# Patient Record
Sex: Female | Born: 1935
Health system: Southern US, Community
[De-identification: ages and names within clinical notes are randomized; demographics above are authoritative.]

## PROBLEM LIST (undated history)

## (undated) DIAGNOSIS — E559 Vitamin D deficiency, unspecified: Secondary | ICD-10-CM

## (undated) DIAGNOSIS — T50995A Adverse effect of other drugs, medicaments and biological substances, initial encounter: Secondary | ICD-10-CM

## (undated) DIAGNOSIS — Z860100 Personal history of colon polyps, unspecified: Secondary | ICD-10-CM

## (undated) DIAGNOSIS — K5730 Diverticulosis of large intestine without perforation or abscess without bleeding: Secondary | ICD-10-CM

## (undated) DIAGNOSIS — Z9889 Other specified postprocedural states: Secondary | ICD-10-CM

## (undated) DIAGNOSIS — M545 Low back pain, unspecified: Secondary | ICD-10-CM

## (undated) DIAGNOSIS — E039 Hypothyroidism, unspecified: Secondary | ICD-10-CM

## (undated) DIAGNOSIS — D649 Anemia, unspecified: Secondary | ICD-10-CM

## (undated) DIAGNOSIS — Z90710 Acquired absence of both cervix and uterus: Secondary | ICD-10-CM

## (undated) DIAGNOSIS — R350 Frequency of micturition: Secondary | ICD-10-CM

## (undated) DIAGNOSIS — I2510 Atherosclerotic heart disease of native coronary artery without angina pectoris: Secondary | ICD-10-CM

## (undated) DIAGNOSIS — K644 Residual hemorrhoidal skin tags: Secondary | ICD-10-CM

## (undated) DIAGNOSIS — E785 Hyperlipidemia, unspecified: Secondary | ICD-10-CM

## (undated) DIAGNOSIS — I10 Essential (primary) hypertension: Secondary | ICD-10-CM

## (undated) DIAGNOSIS — Z800 Family history of malignant neoplasm of digestive organs: Secondary | ICD-10-CM

## (undated) DIAGNOSIS — F329 Major depressive disorder, single episode, unspecified: Secondary | ICD-10-CM

## (undated) DIAGNOSIS — M899 Disorder of bone, unspecified: Secondary | ICD-10-CM

## (undated) DIAGNOSIS — Z8739 Personal history of other diseases of the musculoskeletal system and connective tissue: Secondary | ICD-10-CM

## (undated) DIAGNOSIS — N390 Urinary tract infection, site not specified: Secondary | ICD-10-CM

## (undated) DIAGNOSIS — M949 Disorder of cartilage, unspecified: Secondary | ICD-10-CM

## (undated) DIAGNOSIS — F3289 Other specified depressive episodes: Secondary | ICD-10-CM

## (undated) DIAGNOSIS — G4700 Insomnia, unspecified: Secondary | ICD-10-CM

## (undated) DIAGNOSIS — H269 Unspecified cataract: Secondary | ICD-10-CM

## (undated) HISTORY — DX: Residual hemorrhoidal skin tags: K64.4

## (undated) HISTORY — DX: Low back pain, unspecified: M54.50

## (undated) HISTORY — DX: Insomnia, unspecified: G47.00

## (undated) HISTORY — DX: Disorder of bone, unspecified: M89.9

## (undated) HISTORY — DX: Acquired absence of both cervix and uterus: Z90.710

## (undated) HISTORY — DX: Personal history of other diseases of the musculoskeletal system and connective tissue: Z87.39

## (undated) HISTORY — PX: ROTATOR CUFF REPAIR: SHX139

## (undated) HISTORY — PX: POLYPECTOMY: SHX149

## (undated) HISTORY — DX: Anemia, unspecified: D64.9

## (undated) HISTORY — DX: Vitamin D deficiency, unspecified: E55.9

## (undated) HISTORY — PX: BACK SURGERY: SHX140

## (undated) HISTORY — DX: Low back pain: M54.5

## (undated) HISTORY — DX: Essential (primary) hypertension: I10

## (undated) HISTORY — DX: Atherosclerotic heart disease of native coronary artery without angina pectoris: I25.10

## (undated) HISTORY — DX: Frequency of micturition: R35.0

## (undated) HISTORY — DX: Other specified postprocedural states: Z98.890

## (undated) HISTORY — DX: Family history of malignant neoplasm of digestive organs: Z80.0

## (undated) HISTORY — DX: Personal history of colon polyps, unspecified: Z86.0100

## (undated) HISTORY — DX: Disorder of bone, unspecified: M94.9

## (undated) HISTORY — PX: CHOLECYSTECTOMY: SHX55

## (undated) HISTORY — PX: COLONOSCOPY: SHX174

## (undated) HISTORY — DX: Unspecified cataract: H26.9

## (undated) HISTORY — PX: ABDOMINAL HYSTERECTOMY: SHX81

## (undated) HISTORY — PX: APPENDECTOMY: SHX54

## (undated) HISTORY — DX: Urinary tract infection, site not specified: N39.0

## (undated) HISTORY — DX: Adverse effect of other drugs, medicaments and biological substances, initial encounter: T50.995A

## (undated) HISTORY — DX: Hypothyroidism, unspecified: E03.9

## (undated) HISTORY — DX: Major depressive disorder, single episode, unspecified: F32.9

## (undated) HISTORY — DX: Hyperlipidemia, unspecified: E78.5

## (undated) HISTORY — DX: Diverticulosis of large intestine without perforation or abscess without bleeding: K57.30

## (undated) HISTORY — PX: HEMORRHOID SURGERY: SHX153

## (undated) HISTORY — DX: Personal history of colonic polyps: Z86.010

## (undated) HISTORY — DX: Other specified depressive episodes: F32.89

## (undated) HISTORY — PX: CARDIAC CATHETERIZATION: SHX172

---

## 1998-01-28 ENCOUNTER — Ambulatory Visit: Admission: RE | Admit: 1998-01-28 | Discharge: 1998-01-28 | Payer: Self-pay | Admitting: Plastic Surgery

## 1999-06-17 ENCOUNTER — Other Ambulatory Visit: Admission: RE | Admit: 1999-06-17 | Discharge: 1999-06-17 | Payer: Self-pay | Admitting: Family Medicine

## 1999-07-08 ENCOUNTER — Encounter (INDEPENDENT_AMBULATORY_CARE_PROVIDER_SITE_OTHER): Payer: Self-pay

## 1999-07-08 ENCOUNTER — Other Ambulatory Visit: Admission: RE | Admit: 1999-07-08 | Discharge: 1999-07-08 | Payer: Self-pay | Admitting: Internal Medicine

## 2000-06-17 ENCOUNTER — Other Ambulatory Visit: Admission: RE | Admit: 2000-06-17 | Discharge: 2000-06-17 | Payer: Self-pay | Admitting: Internal Medicine

## 2001-07-04 ENCOUNTER — Encounter: Admission: RE | Admit: 2001-07-04 | Discharge: 2001-10-02 | Payer: Self-pay | Admitting: Family Medicine

## 2003-09-11 ENCOUNTER — Other Ambulatory Visit: Admission: RE | Admit: 2003-09-11 | Discharge: 2003-09-11 | Payer: Self-pay | Admitting: Family Medicine

## 2004-04-24 ENCOUNTER — Ambulatory Visit: Payer: Self-pay | Admitting: Family Medicine

## 2004-04-30 ENCOUNTER — Encounter (INDEPENDENT_AMBULATORY_CARE_PROVIDER_SITE_OTHER): Payer: Self-pay | Admitting: *Deleted

## 2004-04-30 ENCOUNTER — Ambulatory Visit (HOSPITAL_COMMUNITY): Admission: RE | Admit: 2004-04-30 | Discharge: 2004-04-30 | Payer: Self-pay | Admitting: Family Medicine

## 2004-05-07 ENCOUNTER — Encounter (INDEPENDENT_AMBULATORY_CARE_PROVIDER_SITE_OTHER): Payer: Self-pay | Admitting: *Deleted

## 2004-05-07 ENCOUNTER — Ambulatory Visit (HOSPITAL_COMMUNITY): Admission: RE | Admit: 2004-05-07 | Discharge: 2004-05-07 | Payer: Self-pay

## 2004-06-19 ENCOUNTER — Ambulatory Visit: Payer: Self-pay | Admitting: Internal Medicine

## 2004-08-26 ENCOUNTER — Ambulatory Visit: Payer: Self-pay | Admitting: Family Medicine

## 2004-08-28 ENCOUNTER — Ambulatory Visit: Payer: Self-pay

## 2004-09-11 ENCOUNTER — Other Ambulatory Visit: Admission: RE | Admit: 2004-09-11 | Discharge: 2004-09-11 | Payer: Self-pay | Admitting: Family Medicine

## 2004-09-11 ENCOUNTER — Ambulatory Visit: Payer: Self-pay | Admitting: Family Medicine

## 2004-10-08 ENCOUNTER — Ambulatory Visit: Payer: Self-pay | Admitting: Family Medicine

## 2005-01-13 ENCOUNTER — Ambulatory Visit: Payer: Self-pay | Admitting: Family Medicine

## 2005-01-13 ENCOUNTER — Encounter (INDEPENDENT_AMBULATORY_CARE_PROVIDER_SITE_OTHER): Payer: Self-pay | Admitting: *Deleted

## 2005-01-13 ENCOUNTER — Other Ambulatory Visit: Admission: RE | Admit: 2005-01-13 | Discharge: 2005-01-13 | Payer: Self-pay | Admitting: Family Medicine

## 2005-04-13 ENCOUNTER — Encounter: Payer: Self-pay | Admitting: Family Medicine

## 2005-04-16 ENCOUNTER — Ambulatory Visit: Payer: Self-pay | Admitting: Family Medicine

## 2005-04-21 ENCOUNTER — Ambulatory Visit: Payer: Self-pay | Admitting: Family Medicine

## 2005-05-14 ENCOUNTER — Encounter: Admission: RE | Admit: 2005-05-14 | Discharge: 2005-05-14 | Payer: Self-pay | Admitting: Family Medicine

## 2005-05-20 ENCOUNTER — Other Ambulatory Visit: Admission: RE | Admit: 2005-05-20 | Discharge: 2005-05-20 | Payer: Self-pay | Admitting: Obstetrics and Gynecology

## 2005-08-04 ENCOUNTER — Ambulatory Visit: Payer: Self-pay | Admitting: Internal Medicine

## 2005-08-10 ENCOUNTER — Encounter (INDEPENDENT_AMBULATORY_CARE_PROVIDER_SITE_OTHER): Payer: Self-pay | Admitting: Specialist

## 2005-08-10 ENCOUNTER — Ambulatory Visit: Payer: Self-pay | Admitting: Internal Medicine

## 2005-09-15 ENCOUNTER — Ambulatory Visit: Payer: Self-pay | Admitting: Family Medicine

## 2005-12-30 ENCOUNTER — Ambulatory Visit: Admission: RE | Admit: 2005-12-30 | Discharge: 2005-12-30 | Payer: Self-pay | Admitting: Gynecologic Oncology

## 2006-01-12 ENCOUNTER — Ambulatory Visit (HOSPITAL_COMMUNITY): Admission: RE | Admit: 2006-01-12 | Discharge: 2006-01-12 | Payer: Self-pay | Admitting: Gynecologic Oncology

## 2006-02-16 ENCOUNTER — Ambulatory Visit: Admission: RE | Admit: 2006-02-16 | Discharge: 2006-02-16 | Payer: Self-pay | Admitting: Gynecologic Oncology

## 2006-03-02 ENCOUNTER — Ambulatory Visit: Payer: Self-pay | Admitting: Family Medicine

## 2006-05-07 ENCOUNTER — Ambulatory Visit: Payer: Self-pay | Admitting: Family Medicine

## 2006-05-07 LAB — CONVERTED CEMR LAB
ALT: 16 units/L (ref 0–40)
Cholesterol: 212 mg/dL (ref 0–200)
HDL: 53 mg/dL (ref >39.0)
Total CHOL/HDL Ratio: 4
VLDL: 20 mg/dL (ref 0–40)

## 2006-05-14 ENCOUNTER — Encounter: Admission: RE | Admit: 2006-05-14 | Discharge: 2006-05-14 | Payer: Self-pay | Admitting: Family Medicine

## 2006-05-18 ENCOUNTER — Ambulatory Visit: Payer: Self-pay | Admitting: Family Medicine

## 2006-06-15 ENCOUNTER — Ambulatory Visit: Admission: RE | Admit: 2006-06-15 | Discharge: 2006-06-15 | Payer: Self-pay | Admitting: Gynecologic Oncology

## 2006-06-15 ENCOUNTER — Encounter (INDEPENDENT_AMBULATORY_CARE_PROVIDER_SITE_OTHER): Payer: Self-pay | Admitting: *Deleted

## 2006-06-15 ENCOUNTER — Other Ambulatory Visit: Admission: RE | Admit: 2006-06-15 | Discharge: 2006-06-15 | Payer: Self-pay | Admitting: Gynecologic Oncology

## 2006-08-18 ENCOUNTER — Ambulatory Visit: Payer: Self-pay | Admitting: Family Medicine

## 2006-09-08 ENCOUNTER — Encounter: Payer: Self-pay | Admitting: Family Medicine

## 2006-09-08 DIAGNOSIS — E039 Hypothyroidism, unspecified: Secondary | ICD-10-CM | POA: Insufficient documentation

## 2006-09-08 DIAGNOSIS — I10 Essential (primary) hypertension: Secondary | ICD-10-CM | POA: Insufficient documentation

## 2006-09-16 ENCOUNTER — Ambulatory Visit: Payer: Self-pay | Admitting: Family Medicine

## 2006-09-16 LAB — CONVERTED CEMR LAB
AST: 23 units/L (ref 0–37)
Albumin: 4.2 g/dL (ref 3.5–5.2)
Basophils Absolute: 0 10*3/uL (ref 0.0–0.1)
Basophils Relative: 0.2 % (ref 0.0–1.0)
CO2: 30 meq/L (ref 19–32)
Chloride: 103 meq/L (ref 96–112)
Creatinine, Ser: 0.6 mg/dL (ref 0.4–1.2)
Eosinophils Relative: 6.6 % — ABNORMAL HIGH (ref 0.0–5.0)
HCT: 42.8 % (ref 36.0–46.0)
Hemoglobin: 14.8 g/dL (ref 12.0–15.0)
Hgb A1c MFr Bld: 6.2 % — ABNORMAL HIGH (ref 4.6–6.0)
MCHC: 34.5 g/dL (ref 30.0–36.0)
Monocytes Absolute: 0.6 10*3/uL (ref 0.2–0.7)
Neutrophils Relative %: 52.3 % (ref 43.0–77.0)
RBC: 4.61 M/uL (ref 3.87–5.11)
RDW: 11.9 % (ref 11.5–14.6)
Sodium: 138 meq/L (ref 135–145)
Total Bilirubin: 0.7 mg/dL (ref 0.3–1.2)
Total CHOL/HDL Ratio: 3.2
Total Protein: 7.5 g/dL (ref 6.0–8.3)
Triglycerides: 68 mg/dL (ref 0–149)
VLDL: 14 mg/dL (ref 0–40)
WBC: 7.3 10*3/uL (ref 4.5–10.5)

## 2006-11-11 ENCOUNTER — Ambulatory Visit: Payer: Self-pay | Admitting: Family Medicine

## 2006-11-16 ENCOUNTER — Telehealth: Payer: Self-pay | Admitting: Family Medicine

## 2006-11-29 ENCOUNTER — Encounter: Payer: Self-pay | Admitting: Family Medicine

## 2006-12-21 ENCOUNTER — Ambulatory Visit (HOSPITAL_COMMUNITY): Admission: RE | Admit: 2006-12-21 | Discharge: 2006-12-21 | Payer: Self-pay | Admitting: Obstetrics & Gynecology

## 2007-05-10 ENCOUNTER — Ambulatory Visit: Payer: Self-pay | Admitting: Family Medicine

## 2007-05-10 DIAGNOSIS — K644 Residual hemorrhoidal skin tags: Secondary | ICD-10-CM | POA: Insufficient documentation

## 2007-05-10 DIAGNOSIS — E785 Hyperlipidemia, unspecified: Secondary | ICD-10-CM | POA: Insufficient documentation

## 2007-05-10 DIAGNOSIS — E119 Type 2 diabetes mellitus without complications: Secondary | ICD-10-CM | POA: Insufficient documentation

## 2007-05-13 LAB — CONVERTED CEMR LAB
Albumin: 4.4 g/dL (ref 3.5–5.2)
Alkaline Phosphatase: 70 units/L (ref 39–117)
BUN: 19 mg/dL (ref 6–23)
Calcium: 10.1 mg/dL (ref 8.4–10.5)
Creatinine, Ser: 0.8 mg/dL (ref 0.4–1.2)
Direct LDL: 140.5 mg/dL
GFR calc Af Amer: 91 mL/min
HDL: 49.1 mg/dL (ref >39.0)
Potassium: 5.7 meq/L — ABNORMAL HIGH (ref 3.5–5.1)
Triglycerides: 111 mg/dL (ref 0–149)
VLDL: 22 mg/dL (ref 0–40)

## 2007-05-17 ENCOUNTER — Encounter: Admission: RE | Admit: 2007-05-17 | Discharge: 2007-05-17 | Payer: Self-pay | Admitting: Family Medicine

## 2007-06-07 ENCOUNTER — Inpatient Hospital Stay (HOSPITAL_COMMUNITY): Admission: EM | Admit: 2007-06-07 | Discharge: 2007-06-09 | Payer: Self-pay | Admitting: Emergency Medicine

## 2007-06-07 ENCOUNTER — Ambulatory Visit: Payer: Self-pay | Admitting: Family Medicine

## 2007-06-07 ENCOUNTER — Ambulatory Visit: Payer: Self-pay | Admitting: Cardiology

## 2007-06-21 ENCOUNTER — Ambulatory Visit: Payer: Self-pay | Admitting: Cardiology

## 2007-06-22 ENCOUNTER — Telehealth: Payer: Self-pay | Admitting: Family Medicine

## 2007-06-27 ENCOUNTER — Ambulatory Visit: Payer: Self-pay | Admitting: Family Medicine

## 2007-06-27 ENCOUNTER — Ambulatory Visit: Payer: Self-pay

## 2007-06-30 ENCOUNTER — Ambulatory Visit: Payer: Self-pay | Admitting: Cardiology

## 2007-07-19 ENCOUNTER — Ambulatory Visit: Payer: Self-pay | Admitting: Cardiology

## 2007-07-19 LAB — CONVERTED CEMR LAB
ALT: 23 units/L (ref 0–35)
AST: 28 units/L (ref 0–37)
Albumin: 4.1 g/dL (ref 3.5–5.2)
HDL: 50.3 mg/dL (ref >39.0)
Total Protein: 7.5 g/dL (ref 6.0–8.3)
Triglycerides: 69 mg/dL (ref 0–149)

## 2007-08-17 ENCOUNTER — Telehealth: Payer: Self-pay | Admitting: Internal Medicine

## 2007-09-06 DIAGNOSIS — Z860100 Personal history of colon polyps, unspecified: Secondary | ICD-10-CM | POA: Insufficient documentation

## 2007-09-06 DIAGNOSIS — K5730 Diverticulosis of large intestine without perforation or abscess without bleeding: Secondary | ICD-10-CM | POA: Insufficient documentation

## 2007-09-07 ENCOUNTER — Ambulatory Visit: Payer: Self-pay | Admitting: Internal Medicine

## 2007-09-14 ENCOUNTER — Encounter: Payer: Self-pay | Admitting: Internal Medicine

## 2007-09-14 ENCOUNTER — Ambulatory Visit: Payer: Self-pay | Admitting: Internal Medicine

## 2007-09-16 ENCOUNTER — Encounter: Payer: Self-pay | Admitting: Internal Medicine

## 2007-09-20 ENCOUNTER — Ambulatory Visit: Payer: Self-pay | Admitting: Cardiology

## 2007-09-22 ENCOUNTER — Other Ambulatory Visit: Admission: RE | Admit: 2007-09-22 | Discharge: 2007-09-22 | Payer: Self-pay | Admitting: Family Medicine

## 2007-09-22 ENCOUNTER — Encounter: Payer: Self-pay | Admitting: Family Medicine

## 2007-09-22 ENCOUNTER — Ambulatory Visit: Payer: Self-pay | Admitting: Family Medicine

## 2007-09-22 DIAGNOSIS — N390 Urinary tract infection, site not specified: Secondary | ICD-10-CM | POA: Insufficient documentation

## 2007-09-22 DIAGNOSIS — G4700 Insomnia, unspecified: Secondary | ICD-10-CM | POA: Insufficient documentation

## 2007-09-22 DIAGNOSIS — M949 Disorder of cartilage, unspecified: Secondary | ICD-10-CM

## 2007-09-22 DIAGNOSIS — M899 Disorder of bone, unspecified: Secondary | ICD-10-CM | POA: Insufficient documentation

## 2007-09-22 LAB — CONVERTED CEMR LAB
Glucose, Urine, Semiquant: NEGATIVE
Pap Smear: NORMAL
Protein, U semiquant: NEGATIVE
Specific Gravity, Urine: 1.01
WBC Urine, dipstick: NEGATIVE
pH: 7.5

## 2007-09-27 ENCOUNTER — Telehealth: Payer: Self-pay | Admitting: Family Medicine

## 2007-09-27 LAB — CONVERTED CEMR LAB
ALT: 15 units/L (ref 0–35)
AST: 25 units/L (ref 0–37)
Albumin: 4.4 g/dL (ref 3.5–5.2)
BUN: 7 mg/dL (ref 6–23)
Basophils Relative: 0.2 % (ref 0.0–1.0)
CO2: 29 meq/L (ref 19–32)
Chloride: 89 meq/L — ABNORMAL LOW (ref 96–112)
Creatinine, Ser: 0.6 mg/dL (ref 0.4–1.2)
Eosinophils Absolute: 0.1 10*3/uL (ref 0.0–0.7)
Eosinophils Relative: 1.8 % (ref 0.0–5.0)
GFR calc non Af Amer: 105 mL/min
MCV: 95.9 fL (ref 78.0–100.0)
Neutrophils Relative %: 66.5 % (ref 43.0–77.0)
RBC: 4.57 M/uL (ref 3.87–5.11)
TSH: 1.12 microintl units/mL (ref 0.35–5.50)
Total Protein: 7.6 g/dL (ref 6.0–8.3)
VLDL: 17 mg/dL (ref 0–40)
WBC: 7.8 10*3/uL (ref 4.5–10.5)

## 2007-09-28 ENCOUNTER — Encounter: Payer: Self-pay | Admitting: Family Medicine

## 2007-10-05 ENCOUNTER — Ambulatory Visit: Payer: Self-pay | Admitting: Family Medicine

## 2007-10-06 ENCOUNTER — Encounter: Payer: Self-pay | Admitting: Family Medicine

## 2008-01-17 ENCOUNTER — Ambulatory Visit: Payer: Self-pay | Admitting: Family Medicine

## 2008-03-22 ENCOUNTER — Telehealth: Payer: Self-pay | Admitting: Family Medicine

## 2008-04-02 ENCOUNTER — Ambulatory Visit: Payer: Self-pay | Admitting: Family Medicine

## 2008-04-23 ENCOUNTER — Ambulatory Visit: Payer: Self-pay | Admitting: Cardiology

## 2008-05-09 ENCOUNTER — Telehealth: Payer: Self-pay | Admitting: Family Medicine

## 2008-05-09 LAB — CONVERTED CEMR LAB
HDL: 57.6 mg/dL (ref >39.0)
Hgb A1c MFr Bld: 5.9 % (ref 4.6–6.0)
LDL Cholesterol: 95 mg/dL (ref 0–99)
TSH: 1.59 microintl units/mL (ref 0.35–5.50)
Total Bilirubin: 1 mg/dL (ref 0.3–1.2)
Total CHOL/HDL Ratio: 3
VLDL: 21 mg/dL (ref 0–40)

## 2008-05-28 ENCOUNTER — Encounter: Admission: RE | Admit: 2008-05-28 | Discharge: 2008-05-28 | Payer: Self-pay | Admitting: Family Medicine

## 2008-05-30 ENCOUNTER — Encounter (INDEPENDENT_AMBULATORY_CARE_PROVIDER_SITE_OTHER): Payer: Self-pay | Admitting: *Deleted

## 2008-06-12 ENCOUNTER — Telehealth: Payer: Self-pay

## 2008-06-13 ENCOUNTER — Telehealth: Payer: Self-pay | Admitting: Family Medicine

## 2008-09-25 ENCOUNTER — Ambulatory Visit: Payer: Self-pay | Admitting: Family Medicine

## 2008-09-25 DIAGNOSIS — F329 Major depressive disorder, single episode, unspecified: Secondary | ICD-10-CM

## 2008-09-25 DIAGNOSIS — R350 Frequency of micturition: Secondary | ICD-10-CM | POA: Insufficient documentation

## 2008-09-25 DIAGNOSIS — T50995A Adverse effect of other drugs, medicaments and biological substances, initial encounter: Secondary | ICD-10-CM | POA: Insufficient documentation

## 2008-09-25 DIAGNOSIS — F3289 Other specified depressive episodes: Secondary | ICD-10-CM | POA: Insufficient documentation

## 2008-09-25 DIAGNOSIS — D649 Anemia, unspecified: Secondary | ICD-10-CM | POA: Insufficient documentation

## 2008-09-25 LAB — CONVERTED CEMR LAB
Bilirubin Urine: NEGATIVE
Ketones, urine, test strip: NEGATIVE
Specific Gravity, Urine: <1.005

## 2008-10-02 ENCOUNTER — Ambulatory Visit: Payer: Self-pay | Admitting: Family Medicine

## 2008-10-03 LAB — CONVERTED CEMR LAB
ALT: 25 units/L (ref 0–35)
AST: 30 units/L (ref 0–37)
Albumin: 4.7 g/dL (ref 3.5–5.2)
BUN: 13 mg/dL (ref 6–23)
Basophils Relative: 0.1 % (ref 0.0–3.0)
Chloride: 94 meq/L — ABNORMAL LOW (ref 96–112)
Cholesterol: 168 mg/dL (ref 0–200)
Eosinophils Relative: 5.2 % — ABNORMAL HIGH (ref 0.0–5.0)
HCT: 45.3 % (ref 36.0–46.0)
Hemoglobin: 15.5 g/dL — ABNORMAL HIGH (ref 12.0–15.0)
LDL Cholesterol: 83 mg/dL (ref 0–99)
Lymphs Abs: 2.5 10*3/uL (ref 0.7–4.0)
MCV: 95.3 fL (ref 78.0–100.0)
Monocytes Absolute: 0.6 10*3/uL (ref 0.1–1.0)
Monocytes Relative: 6.8 % (ref 3.0–12.0)
Neutro Abs: 5.4 10*3/uL (ref 1.4–7.7)
Platelets: 187 10*3/uL (ref 150.0–400.0)
Potassium: 3.8 meq/L (ref 3.5–5.1)
Sodium: 133 meq/L — ABNORMAL LOW (ref 135–145)
TSH: 0.65 microintl units/mL (ref 0.35–5.50)
Total Bilirubin: 1.3 mg/dL — ABNORMAL HIGH (ref 0.3–1.2)
Total Protein: 8 g/dL (ref 6.0–8.3)
Vit D, 25-Hydroxy: 45 ng/mL (ref 30–89)
WBC: 9 10*3/uL (ref 4.5–10.5)

## 2008-10-05 ENCOUNTER — Ambulatory Visit: Payer: Self-pay | Admitting: Internal Medicine

## 2008-10-08 ENCOUNTER — Ambulatory Visit (HOSPITAL_COMMUNITY): Admission: RE | Admit: 2008-10-08 | Discharge: 2008-10-08 | Payer: Self-pay | Admitting: Family Medicine

## 2008-10-08 ENCOUNTER — Encounter: Payer: Self-pay | Admitting: Family Medicine

## 2008-10-10 ENCOUNTER — Encounter: Payer: Self-pay | Admitting: Family Medicine

## 2008-10-26 ENCOUNTER — Ambulatory Visit: Payer: Self-pay | Admitting: Internal Medicine

## 2008-11-27 ENCOUNTER — Ambulatory Visit: Payer: Self-pay | Admitting: Family Medicine

## 2008-11-27 DIAGNOSIS — M545 Low back pain, unspecified: Secondary | ICD-10-CM | POA: Insufficient documentation

## 2008-11-27 LAB — CONVERTED CEMR LAB
Ketones, urine, test strip: NEGATIVE
Nitrite: NEGATIVE
Protein, U semiquant: NEGATIVE
Specific Gravity, Urine: <1.005
Urobilinogen, UA: 0.2

## 2009-04-17 ENCOUNTER — Telehealth: Payer: Self-pay | Admitting: Family Medicine

## 2009-05-01 DIAGNOSIS — R079 Chest pain, unspecified: Secondary | ICD-10-CM | POA: Insufficient documentation

## 2009-05-01 DIAGNOSIS — I2510 Atherosclerotic heart disease of native coronary artery without angina pectoris: Secondary | ICD-10-CM | POA: Insufficient documentation

## 2009-05-07 ENCOUNTER — Ambulatory Visit: Payer: Self-pay | Admitting: Cardiology

## 2009-05-30 ENCOUNTER — Encounter: Admission: RE | Admit: 2009-05-30 | Discharge: 2009-05-30 | Payer: Self-pay | Admitting: Family Medicine

## 2009-08-23 ENCOUNTER — Encounter: Payer: Self-pay | Admitting: Family Medicine

## 2009-09-11 ENCOUNTER — Ambulatory Visit (HOSPITAL_BASED_OUTPATIENT_CLINIC_OR_DEPARTMENT_OTHER): Admission: RE | Admit: 2009-09-11 | Discharge: 2009-09-12 | Payer: Self-pay | Admitting: Orthopedic Surgery

## 2009-10-03 ENCOUNTER — Ambulatory Visit: Payer: Self-pay | Admitting: Family Medicine

## 2009-10-03 ENCOUNTER — Other Ambulatory Visit: Admission: RE | Admit: 2009-10-03 | Discharge: 2009-10-03 | Payer: Self-pay | Admitting: Family Medicine

## 2009-10-03 DIAGNOSIS — E559 Vitamin D deficiency, unspecified: Secondary | ICD-10-CM | POA: Insufficient documentation

## 2009-10-03 LAB — CONVERTED CEMR LAB
Bilirubin Urine: NEGATIVE
Ketones, urine, test strip: NEGATIVE
Nitrite: NEGATIVE
Urobilinogen, UA: 0.2

## 2009-10-08 LAB — CONVERTED CEMR LAB
BUN: 11 mg/dL (ref 6–23)
Basophils Absolute: 0 10*3/uL (ref 0.0–0.1)
Bilirubin, Direct: 0.1 mg/dL (ref 0.0–0.3)
Chloride: 97 meq/L (ref 96–112)
Cholesterol: 143 mg/dL (ref 0–200)
Creatinine, Ser: 0.6 mg/dL (ref 0.4–1.2)
Eosinophils Absolute: 0.6 10*3/uL (ref 0.0–0.7)
GFR calc non Af Amer: 115.03 mL/min (ref >60)
Glucose, Bld: 110 mg/dL — ABNORMAL HIGH (ref 70–99)
HCT: 41.9 % (ref 36.0–46.0)
Hgb A1c MFr Bld: 6 % (ref 4.6–6.5)
LDL Cholesterol: 64 mg/dL (ref 0–99)
Lymphs Abs: 2.3 10*3/uL (ref 0.7–4.0)
MCV: 95.2 fL (ref 78.0–100.0)
Monocytes Absolute: 0.6 10*3/uL (ref 0.1–1.0)
Neutrophils Relative %: 57.6 % (ref 43.0–77.0)
Platelets: 260 10*3/uL (ref 150.0–400.0)
Potassium: 5.2 meq/L — ABNORMAL HIGH (ref 3.5–5.1)
RDW: 12.2 % (ref 11.5–14.6)
TSH: 0.15 microintl units/mL — ABNORMAL LOW (ref 0.35–5.50)
Total Bilirubin: 0.8 mg/dL (ref 0.3–1.2)
Triglycerides: 126 mg/dL (ref 0.0–149.0)
VLDL: 25.2 mg/dL (ref 0.0–40.0)
Vit D, 25-Hydroxy: 90 ng/mL — ABNORMAL HIGH (ref 30–89)

## 2009-10-09 LAB — CONVERTED CEMR LAB: Pap Smear: NEGATIVE

## 2009-10-18 ENCOUNTER — Ambulatory Visit: Payer: Self-pay | Admitting: Family Medicine

## 2009-10-23 ENCOUNTER — Encounter: Payer: Self-pay | Admitting: Family Medicine

## 2009-11-08 LAB — CONVERTED CEMR LAB
OCCULT 1: NEGATIVE
OCCULT 2: NEGATIVE
OCCULT 3: NEGATIVE

## 2009-11-11 ENCOUNTER — Encounter: Payer: Self-pay | Admitting: Family Medicine

## 2009-12-30 ENCOUNTER — Telehealth: Payer: Self-pay | Admitting: Family Medicine

## 2010-02-04 ENCOUNTER — Ambulatory Visit: Payer: Self-pay | Admitting: Family Medicine

## 2010-03-11 ENCOUNTER — Encounter (INDEPENDENT_AMBULATORY_CARE_PROVIDER_SITE_OTHER): Payer: Self-pay | Admitting: *Deleted

## 2010-03-14 ENCOUNTER — Encounter: Admission: RE | Admit: 2010-03-14 | Discharge: 2010-03-14 | Payer: Self-pay | Admitting: Internal Medicine

## 2010-03-14 ENCOUNTER — Encounter (INDEPENDENT_AMBULATORY_CARE_PROVIDER_SITE_OTHER): Payer: Self-pay | Admitting: *Deleted

## 2010-04-17 ENCOUNTER — Ambulatory Visit
Admission: RE | Admit: 2010-04-17 | Discharge: 2010-04-17 | Payer: Self-pay | Source: Home / Self Care | Attending: Internal Medicine | Admitting: Internal Medicine

## 2010-05-03 ENCOUNTER — Encounter: Payer: Self-pay | Admitting: Family Medicine

## 2010-05-11 LAB — CONVERTED CEMR LAB: TSH: 0.57 microintl units/mL (ref 0.35–5.50)

## 2010-05-12 ENCOUNTER — Telehealth: Payer: Self-pay | Admitting: Family Medicine

## 2010-05-13 NOTE — Progress Notes (Signed)
Summary: REQ FOR LABS / BLDWRK  Phone Note Call from Patient   Caller: Patient [phone] Reason for Call: Talk to Nurse, Talk to Doctor Summary of Call: Pt called in to req labs / bldwrk because it has been > 6 mths since she had same.... Pt adv that she is Lipitor and wants to have her bldwrk done atleast every 6 mths.... Can you advise orders for same and I will call pt to schedule lab appt and OV....?  Initial call taken by: Debbra Riding,  April 17, 2009 4:25 PM  Follow-up for Phone Call        PLEASE SCHEDULE LIPIDS AND LIVER STUDIES Follow-up by: Judithann Sheen MD,  May 02, 2009 5:47 PM

## 2010-05-13 NOTE — Letter (Signed)
Summary: [facility]  [facility]   Imported By: Maryln Gottron 09/12/2009 14:26:55  _____________________________________________________________________  External Attachment:    Type:   Image     Comment:   External Document

## 2010-05-13 NOTE — Letter (Signed)
Summary: Results Follow-up Letter  [place] [facility]  [address] [place], [place] [address]   Phone: [phone]  Fax: [phone]    11/11/2009  [address] [place], [place]  [address]  Dear Ms. Porr,     The following are the results of your recent test(s):   Hemocult cards were all negative.    Sincerely,     Dr Gwenyth Bender Stafford,MD  [place] [facility] [place]

## 2010-05-13 NOTE — Progress Notes (Signed)
Summary: Diazepam refill  Phone Note Refill Request Message from:  Fax from Pharmacy on December 30, 2009 10:35 AM  Refills Requested: Medication #1:  DIAZEPAM 5 MG  TABS as needed   Dosage confirmed as above?Dosage Confirmed Please advise? [facility] [phone]  Initial call taken by: Josph Macho RMA,  December 30, 2009 10:35 AM  Follow-up for Phone Call        OK to refill sig 1 tab by mouth once daily as needed anxiety # 30 with 1 rf    Prescriptions: DIAZEPAM 5 MG  TABS (DIAZEPAM) as needed  #30 x 1   Entered by:   Josph Macho RMA   Authorized by:   Danise Edge MD   Signed by:   Josph Macho RMA on 12/30/2009   Method used:   Telephoned to ...       [facility]* (mail-order)       [address]       [place], [place]  [address]       Ph: [phone]       Fax: [phone]   RxID:   [id number]  Called into [facility] Valentina Gu)

## 2010-05-13 NOTE — Assessment & Plan Note (Signed)
Summary: FLU SHOT//ALP   Nurse Visit   Review of Systems       Flu Vaccine Consent Questions     Do you have a history of severe allergic reactions to this vaccine? no    Any prior history of allergic reactions to egg and/or gelatin? no    Do you have a sensitivity to the preservative Thimersol? no    Do you have a past history of Guillan-Barre Syndrome? no    Do you currently have an acute febrile illness? no    Have you ever had a severe reaction to latex? no    Vaccine information given and explained to patient? yes    Are you currently pregnant? no    Lot Number:[id number]   Exp Date:10/11/2010   Site Given  Left Deltoid IM Josph Macho RMA  February 04, 2010 9:26 AM    Allergies: 1)  ! * Codeine 2)  ! Niacin  Orders Added: 1)  Flu Vaccine 24yrs + MEDICARE PATIENTS [Q2039] 2)  Administration Flu vaccine - MCR [G0008]

## 2010-05-13 NOTE — Assessment & Plan Note (Signed)
Summary: pt will come in fasting/njr pt rsc/njr   Vital Signs:  Patient profile:   76 year old female Height:      62 inches Weight:      132 pounds BMI:     24.23 O2 Sat:      98 % Temp:     98.4 degrees F Pulse rate:   70 / minute Pulse rhythm:   regular BP sitting:   140 / 90  (left arm)  Vitals Entered By: Pura Spice, RN (October 03, 2009 8:48 AM) CC: go over problems refills fasting for labs  fell im May had surgery on rt shoulder by Dr Eulah Pont    History of Present Illness: This 76 year old white divorced female is into discuss her medical problem and get necessary refill medications She relates she fell this past year and ended her right shoulder and had surgery by Dr. Richardson Landry Complaining of hemorrhoids Strain right knee and having pain in the knee where is a brace decreases her ability to wall Mammogram 2000 lab and Continues to see Dr. Elijah Birk wall yearly for cardiac evaluation Bone density 2010 hypertension controlled  Allergies: 1)  ! * Codeine 2)  ! Niacin  Past History:  Past Medical History: Last updated: 05/01/2009 CAD, NATIVE VESSEL (ICD-414.01) CHEST PAIN, EXERTIONAL (ICD-786.50) HYPERTENSION (ICD-401.9) HYPERLIPIDEMIA (ICD-272.4) LOW BACK PAIN SYNDROME (ICD-724.2) SPECIAL SCREENING MALIG NEOPLASMS OTHER SITES (ICD-V76.49) DEPRESSION (ICD-311) FREQUENCY, URINARY (ICD-788.41) ANEMIA (ICD-285.9) UNS ADVRS EFF OTH RX MEDICINAL&BIOLOGICAL SBSTNC (ICD-995.29) INSOMNIA (ICD-780.52) SCREENING FOR MALIGNANT NEOPLASM OF THE CERVIX (ICD-V76.2) UTI (ICD-599.0) OSTEOPENIA (ICD-733.90) DIVERTICULOSIS OF COLON (ICD-562.10) COLONIC POLYPS, ADENOMATOUS, HX OF (ICD-V12.72) EXTERNAL HEMORRHOIDS (ICD-455.3) DIABETES MELLITUS, TYPE II (ICD-250.00) HYPOTHYROIDISM (ICD-244.9)    Past Surgical History: Appendectomy-1961 Cholecystectomy-2006 Hysterectomy-1974 Rotator cuff repair-1999, 2001 shoulder repair by Dr. Eulah Pont  Past History:  Care  Management: Cardiology: Dr Daleen Squibb  Dermatology: [facility] Gastroenterology: Dr Juanda Chance  Orthopedics:Dr Eulah Pont  Ophthalmology:Dr Apolonio Schneiders   Review of Systems      See HPI  The patient denies anorexia, fever, weight loss, weight gain, vision loss, decreased hearing, hoarseness, chest pain, syncope, dyspnea on exertion, peripheral edema, prolonged cough, headaches, hemoptysis, abdominal pain, melena, hematochezia, severe indigestion/heartburn, hematuria, incontinence, genital sores, muscle weakness, suspicious skin lesions, transient blindness, difficulty walking, depression, unusual weight change, abnormal bleeding, enlarged lymph nodes, angioedema, breast masses, and testicular masses.    Physical Exam  General:  Well-developed,well-nourished,in no acute distress; alert,appropriate and cooperative throughout examination Head:  Normocephalic and atraumatic without obvious abnormalities. No apparent alopecia or balding. Eyes:  No corneal or conjunctival inflammation noted. EOMI. Perrla. Funduscopic exam benign, without hemorrhages, exudates or papilledema. Vision grossly normal. Ears:  External ear exam shows no significant lesions or deformities.  Otoscopic examination reveals clear canals, tympanic membranes are intact bilaterally without bulging, retraction, inflammation or discharge. Hearing is grossly normal bilaterally. Nose:  External nasal examination shows no deformity or inflammation. Nasal mucosa are pink and moist without lesions or exudates. Mouth:  Oral mucosa and oropharynx without lesions or exudates.  Teeth in good repair. Neck:  No deformities, masses, or tenderness noted. Chest Wall:  No deformities, masses, or tenderness noted. Breasts:  No mass, nodules, thickening, tenderness, bulging, retraction, inflamation, nipple discharge or skin changes noted.   Lungs:  Normal respiratory effort, chest expands symmetrically. Lungs are clear to auscultation, no crackles or  wheezes. Heart:  Normal rate and regular rhythm. S1 and S2 normal without gallop, murmur, click, rub or other extra sounds. Abdomen:  Bowel  sounds positive,abdomen soft and non-tender without masses, organomegaly or hernias noted. Rectal:  external hemorrhoids Genitalia:  Normal introitus for age, no external lesions, no vaginal discharge, mucosa pink and moist, no vaginal or cervical lesions, no vaginal atrophy, no friaility or hemorrhage, normal uterus size and position, no adnexal masses or tenderness Msk:  mid movement and pain on movement of the right shoulder Slightly swollen and right knee painful on flexion tender over the joint Pulses:  R and L carotid,radial,femoral,dorsalis pedis and posterior tibial pulses are full and equal bilaterally Extremities:  No clubbing, cyanosis, edema, or deformity noted with normal full range of motion of all joints.   Neurologic:  No cranial nerve deficits noted. Station and gait are normal. Plantar reflexes are down-going bilaterally. DTRs are symmetrical throughout. Sensory, motor and coordinative functions appear intact. Skin:  Intact without suspicious lesions or rashes Cervical Nodes:  No lymphadenopathy noted Axillary Nodes:  No palpable lymphadenopathy Inguinal Nodes:  No significant adenopathy Psych:  Cognition and judgment appear intact. Alert and cooperative with normal attention span and concentration. No apparent delusions, illusions, hallucinations   Impression & Recommendations:  Problem # 1:  CAD, NATIVE VESSEL (ICD-414.01) Assessment Improved  Her updated medication list for this problem includes:    Bisoprolol-hydrochlorothiazide 2.5-6.25 Mg Tabs (Bisoprolol-hydrochlorothiazide) ..... Once daily    Bayer Aspirin 325 Mg Tabs (Aspirin) ..... Once daily  Problem # 2:  HYPERTENSION (ICD-401.9) Assessment: Improved  Her updated medication list for this problem includes:    Bisoprolol-hydrochlorothiazide 2.5-6.25 Mg Tabs  (Bisoprolol-hydrochlorothiazide) ..... Once daily  Problem # 3:  LOW BACK PAIN SYNDROME (ICD-724.2) Assessment: Improved  Her updated medication list for this problem includes:    Bayer Aspirin 325 Mg Tabs (Aspirin) ..... Once daily  Problem # 4:  DEPRESSION (ICD-311) Assessment: Improved  Her updated medication list for this problem includes:    Diazepam 5 Mg Tabs (Diazepam) .Marland Kitchen... As needed    Alprazolam 0.25 Mg Tbdp (Alprazolam) .Marland Kitchen... 1 three times a day as needed stress  Problem # 5:  EXTERNAL HEMORRHOIDS (ICD-455.3) Assessment: Deteriorated  Analpram HC cream b.i.d.  Orders: Prescription Created Electronically [phone])  Problem # 6:  DIABETES MELLITUS, TYPE II (ICD-250.00) Assessment: Improved  Her updated medication list for this problem includes:    Bayer Aspirin 325 Mg Tabs (Aspirin) ..... Once daily  Orders: TLB-A1C / Hgb A1C (Glycohemoglobin) (83036-A1C)  Problem # 7:  HYPOTHYROIDISM (ICD-244.9) Assessment: Improved  The following medications were removed from the medication list:    Levoxyl 75 Mcg Tabs (Levothyroxine sodium) ..... Once daily Her updated medication list for this problem includes:    Levoxyl 50 Mcg Tabs (Levothyroxine sodium) .Marland Kitchen... 1 qd  Orders: TLB-TSH (Thyroid Stimulating Hormone) (84443-TSH)  Complete Medication List: 1)  Bisoprolol-hydrochlorothiazide 2.5-6.25 Mg Tabs (Bisoprolol-hydrochlorothiazide) .... Once daily 2)  Fexofenadine Hcl 60 Mg Tabs (Fexofenadine hcl) .... As needed 3)  Diazepam 5 Mg Tabs (Diazepam) .... As needed 4)  Miralax Powd (Polyethylene glycol 3350) .Marland Kitchen.. 1 scoop once daily as needed constipation 5)  Lipitor 80 Mg Tabs (Atorvastatin calcium) .Marland Kitchen.. 1 by mouth once daily 6)  Bayer Aspirin 325 Mg Tabs (Aspirin) .... Once daily 7)  Alprazolam 0.25 Mg Tbdp (Alprazolam) .Marland Kitchen.. 1 three times a day as needed stress 8)  Accu-chek Compact Test Drum Strp (Glucose blood) .... Check once daily 9)  Fish Oil 1000 Mg Caps (Omega-3  fatty acids) .... 2 caps qam 10)  B Complex Tabs (B complex vitamins) .Marland Kitchen.. 1 tab when pt feels tired  11)  Folic Acid 1 Mg Tabs (Folic acid) .Marland Kitchen.. 1 tab once daily 12)  Analpram-hc Singles 1-2.5 % Crea (Hydrocortisone ace-pramoxine) .... Insert bid 13)  Librium 10 Mg  .Marland Kitchen.. 1 qid as needed stress 14)  Levoxyl 50 Mcg Tabs (Levothyroxine sodium) .Marland Kitchen.. 1 qd  Other Orders: Venipuncture (60630) T-Vitamin D (25-Hydroxy) ([address]-93235) UA Dipstick w/o Micro (automated)  (81003) TLB-Lipid Panel (80061-LIPID) TLB-BMP (Basic Metabolic Panel-BMET) (80048-METABOL) TLB-CBC Platelet - w/Differential (85025-CBCD) TLB-Hepatic/Liver Function Pnl (80076-HEPATIC)  Patient Instructions: 1)  we'll call lab results 2)  peak medications as prescribed for your multiple medical problems 3)  Refill medicines Prescriptions: LEVOXYL 50 MCG TABS (LEVOTHYROXINE SODIUM) 1 qd  #90 x 3   Entered and Authorized by:   Judithann Sheen MD   Signed by:   Judithann Sheen MD on 10/08/2009   Method used:   Electronically to        [facility] # [phone]* (retail)       [address]       [place], [place]  [address]       Ph: [phone]       Fax: [phone]   RxID:   [id number] FEXOFENADINE HCL 60 MG  TABS (FEXOFENADINE HCL) as needed  #60 x 11   Entered and Authorized by:   Judithann Sheen MD   Signed by:   Judithann Sheen MD on 10/03/2009   Method used:   Electronically to        [facility] # [phone]* (retail)       [address]       [place], [place]  [address]       Ph: [phone]       Fax: [phone]   RxID:   [id number] LIBRIUM 10 MG 1 qid as needed stress  #120 x 5   Entered and Authorized by:   Judithann Sheen MD   Signed by:   Judithann Sheen MD on 10/03/2009   Method used:   Print then Give to Patient   RxID:   [id number] FOLIC ACID 1 MG TABS (FOLIC ACID) 1 tab once daily  #30 x 11   Entered and Authorized by:   Judithann Sheen MD    Signed by:   Judithann Sheen MD on 10/03/2009   Method used:   Electronically to        [facility] # [phone]* (retail)       [address]       [place], [place]  [address]       Ph: [phone]       Fax: [phone]   RxID:   [id number] ACCU-CHEK COMPACT TEST DRUM  STRP (GLUCOSE BLOOD) check once daily  #1 box x 6   Entered and Authorized by:   Judithann Sheen MD   Signed by:   Judithann Sheen MD on 10/03/2009   Method used:   Electronically to        [facility] # [phone]* (retail)       [address]       [place], [place]  [address]       Ph: [phone]       Fax: [phone]   RxID:   [id number] LIPITOR 80 MG  TABS (ATORVASTATIN CALCIUM) 1 by mouth once daily  #30 x 11   Entered and Authorized by:   Judithann Sheen  MD   Signed by:   Judithann Sheen MD on 10/03/2009   Method used:   Electronically to        [facility] # [phone]* (retail)       [address]       [place], [place]  [address]       Ph: [phone]       Fax: [phone]   RxID:   [id number] MIRALAX   POWD (POLYETHYLENE GLYCOL 3350) 1 scoop once daily as needed constipation  #527 x 5   Entered and Authorized by:   Judithann Sheen MD   Signed by:   Judithann Sheen MD on 10/03/2009   Method used:   Electronically to        [facility] # [phone]* (retail)       [address]       [place], [place]  [address]       Ph: [phone]       Fax: [phone]   RxID:   [id number] BISOPROLOL-HYDROCHLOROTHIAZIDE 2.5-6.25 MG  TABS (BISOPROLOL-HYDROCHLOROTHIAZIDE) once daily  #90 x 3   Entered and Authorized by:   Judithann Sheen MD   Signed by:   Judithann Sheen MD on 10/03/2009   Method used:   Electronically to        [facility] # [phone]* (retail)       [address]       [place], [place]  [address]       Ph: [phone]       Fax: [phone]   RxID:   [id number] LEVOXYL 75 MCG  TABS (LEVOTHYROXINE SODIUM) once  daily  #90 x 3   Entered and Authorized by:   Judithann Sheen MD   Signed by:   Judithann Sheen MD on 10/03/2009   Method used:   Electronically to        [facility] # [phone]* (retail)       [address]       [place], [place]  [address]       Ph: [phone]       Fax: [phone]   RxID:   [id number] [facility] SINGLES 1-2.5 % CREA (HYDROCORTISONE ACE-PRAMOXINE) Insert bid  #1 pkge x 5   Entered and Authorized by:   Judithann Sheen MD   Signed by:   Judithann Sheen MD on 10/03/2009   Method used:   Electronically to        [facility] # [phone]* (retail)       [address]       [place], [place]  [address]       Ph: [phone]       Fax: [phone]   RxID:   [id number]     Laboratory Results   Urine Tests    Routine Urinalysis   Color: yellow Appearance: Clear Glucose: negative   (Normal Range: Negative) Bilirubin: negative   (Normal Range: Negative) Ketone: negative   (Normal Range: Negative) Spec. Gravity: 1.010   (Normal Range: 1.003-1.035) Blood: trace-lysed   (Normal Range: Negative) pH: 6.5   (Normal Range: 5.0-8.0) Protein: negative   (Normal Range: Negative) Urobilinogen: 0.2   (Normal Range: 0-1) Nitrite: negative   (Normal Range: Negative) Leukocyte Esterace: negative   (Normal Range: Negative)    Comments: Rita Ohara  October 03, 2009 10:29 AM

## 2010-05-13 NOTE — Assessment & Plan Note (Signed)
Summary: CAD/ANAS  Medications Added FEXOFENADINE HCL 60 MG  TABS (FEXOFENADINE HCL) as needed DIAZEPAM 5 MG  TABS (DIAZEPAM) as needed B COMPLEX  TABS (B COMPLEX VITAMINS) 1 tab when pt feels tired        Primary Provider:  Dr. Dianna Limbo  CC:  when pt exercises she states she has sob.  History of Present Illness: Christina [name] returns for evaluation and management of her stable exertional angina, moderate disease in a small left circumflex, normal left ventricular function, hypertension, and hyperlipidemia.  She continues to have exertional angina. Is very predictable. It occurs within the first several minutes of exercise. She sometimes takes a nitroglycerin. It is worse if she eats prior to exercise. It is not prolonged. There is no associated symptoms. She is then able to finish her exercise which she does for 5 times a week.  Clinical Reports Reviewed:  Cardiac Cath:  06/08/2007: Cardiac Cath Findings:   Left ventriculography shows normal LV function.  The LVEF is 65%.      Aortic root angiography demonstrates no aortic insufficiency, normal-   sized proximal aorta.  No anomalous coronary arteries are identified.      ASSESSMENT:   1. Moderate ostial left circumflex stenosis involving the small left       circumflex.   2. Nonobstructive plaque in the left mainstem, left anterior       descending artery and right coronary artery.   3. Normal left ventricular function.      I recommend medical therapy for Christina [name]'s coronary artery disease.  It   is possible that the ostial circumflex stenosis as a culprit for her   symptoms, although she is having resting chest pain and has normal flow   through this vessel and I would not expect rest angina from this type of   moderate lesion.  I am going to review her films with Dr. Juanda Chance for   another opinion, but I do not think her circumflex is favorable for PCI   in the setting of a small vessel and the ostial nature of the  disease.               Veverly Fells. Excell Seltzer, MD   Electronically Signed       Current Medications (verified): 1)  Levoxyl 75 Mcg  Tabs (Levothyroxine Sodium) .... Once Daily 2)  Bisoprolol-Hydrochlorothiazide 2.5-6.25 Mg  Tabs (Bisoprolol-Hydrochlorothiazide) .... Once Daily 3)  Fexofenadine Hcl 60 Mg  Tabs (Fexofenadine Hcl) .... As Needed 4)  Diazepam 5 Mg  Tabs (Diazepam) .... As Needed 5)  Miralax   Powd (Polyethylene Glycol 3350) .Marland Kitchen.. 1 Scoop Once Daily As Needed Constipation 6)  Lipitor 80 Mg  Tabs (Atorvastatin Calcium) .Marland Kitchen.. 1 By Mouth Once Daily 7)  Bayer Aspirin 325 Mg  Tabs (Aspirin) .... Once Daily 8)  Alprazolam 0.25 Mg  Tbdp (Alprazolam) .Marland Kitchen.. 1 Three Times A Day As Needed Stress 9)  Accu-Chek Compact Test Drum  Strp (Glucose Blood) .... Check Once Daily 10)  Fish Oil 1000 Mg Caps (Omega-3 Fatty Acids) .... 2 Caps Qam 11)  B Complex  Tabs (B Complex Vitamins) .Marland Kitchen.. 1 Tab When Pt Feels Tired 12)  Folic Acid 1 Mg Tabs (Folic Acid) .Marland Kitchen.. 1 Tab Once Daily  Allergies: 1)  ! * Codeine 2)  ! Niacin  Past History:  Past Medical History: Last updated: 05/01/2009 CAD, NATIVE VESSEL (ICD-414.01) CHEST PAIN, EXERTIONAL (ICD-786.50) HYPERTENSION (ICD-401.9) HYPERLIPIDEMIA (ICD-272.4) LOW BACK PAIN SYNDROME (ICD-724.2) SPECIAL SCREENING  MALIG NEOPLASMS OTHER SITES (ICD-V76.49) DEPRESSION (ICD-311) FREQUENCY, URINARY (ICD-788.41) ANEMIA (ICD-285.9) UNS ADVRS EFF OTH RX MEDICINAL&BIOLOGICAL SBSTNC (ICD-995.29) INSOMNIA (ICD-780.52) SCREENING FOR MALIGNANT NEOPLASM OF THE CERVIX (ICD-V76.2) UTI (ICD-599.0) OSTEOPENIA (ICD-733.90) DIVERTICULOSIS OF COLON (ICD-562.10) COLONIC POLYPS, ADENOMATOUS, HX OF (ICD-V12.72) EXTERNAL HEMORRHOIDS (ICD-455.3) DIABETES MELLITUS, TYPE II (ICD-250.00) HYPOTHYROIDISM (ICD-244.9)    Past Surgical History: Last updated: 09/06/2007 Appendectomy-1961 Cholecystectomy-2006 Hysterectomy-1974 Rotator cuff repair-1999, 2001  Family  History: Last updated: 09/07/2007 Family History of Diabetes: Sister,brother,grandparents Family History of Heart Disease: Brother  Social History: Last updated: 09/07/2007 Divorced Occupation: retired Patient has never smoked.  Alcohol Use - yes 1 glass of wine a day Illicit Drug Use - no  Risk Factors: Smoking Status: never (09/07/2007)  Review of Systems       negative other than history of present illness  Vital Signs:  Patient profile:   76 year old female Height:      63 inches Weight:      137 pounds BMI:     24.36 Pulse rate:   71 / minute Resp:     12 per minute BP sitting:   130 / 80  (left arm)  Vitals Entered By: Kem Parkinson (May 07, 2009 3:04 PM)  Physical Exam  General:  Well developed, well nourished, in no acute distress. Head:  normocephalic and atraumatic Eyes:  PERRLA/EOM intact; conjunctiva and lids normal. Neck:  Neck supple, no JVD. No masses, thyromegaly or abnormal cervical nodes. Chest Mickel Schreur:  no deformities or breast masses noted Lungs:  Clear bilaterally to auscultation and percussion. Heart:  Non-displaced PMI, chest non-tender; regular rate and rhythm, S1, S2 without murmurs, rubs or gallops. Carotid upstroke normal, no bruit. Normal abdominal aortic size, no bruits. Femorals normal pulses, no bruits. Pedals normal pulses. No edema, no varicosities. Abdomen:  Bowel sounds positive; abdomen soft and non-tender without masses, organomegaly, or hernias noted. No hepatosplenomegaly. Msk:  Back normal, normal gait. Muscle strength and tone normal. Pulses:  pulses normal in all 4 extremities Extremities:  No clubbing or cyanosis. Neurologic:  Alert and oriented x 3. Skin:  Intact without lesions or rashes. Psych:  Normal affect.   EKG  Procedure date:  05/07/2009  Findings:      nsinus rhythm, minimal voltage for LVH, no change.  Impression & Recommendations:  Problem # 1:  CAD, NATIVE VESSEL (ICD-414.01) Assessment  Unchanged  Her updated medication list for this problem includes:    Bisoprolol-hydrochlorothiazide 2.5-6.25 Mg Tabs (Bisoprolol-hydrochlorothiazide) ..... Once daily    Bayer Aspirin 325 Mg Tabs (Aspirin) ..... Once daily  Problem # 2:  CHEST PAIN, EXERTIONAL (ICD-786.50) Assessment: Unchanged  Her updated medication list for this problem includes:    Bisoprolol-hydrochlorothiazide 2.5-6.25 Mg Tabs (Bisoprolol-hydrochlorothiazide) ..... Once daily    Bayer Aspirin 325 Mg Tabs (Aspirin) ..... Once daily  Orders: EKG w/ Interpretation (93000)  Problem # 3:  HYPERLIPIDEMIA (ICD-272.4) Assessment: Unchanged  Her updated medication list for this problem includes:    Lipitor 80 Mg Tabs (Atorvastatin calcium) .Marland Kitchen... 1 by mouth once daily  Problem # 4:  HYPERTENSION (ICD-401.9) Assessment: Improved  Her updated medication list for this problem includes:    Bisoprolol-hydrochlorothiazide 2.5-6.25 Mg Tabs (Bisoprolol-hydrochlorothiazide) ..... Once daily    Bayer Aspirin 325 Mg Tabs (Aspirin) ..... Once daily  Patient Instructions: 1)  Your physician recommends that you schedule a follow-up appointment in: 12 [facility] WITH DR Darol Cush 2)  Your physician has recommended you make the following change in your medication:  Prescriptions: LIPITOR 80  MG  TABS (ATORVASTATIN CALCIUM) 1 by mouth once daily  #30 x 11   Entered by:   Scherrie Bateman, LPN   Authorized by:   Gaylord Shih, MD, [facility]   Signed by:   Scherrie Bateman, LPN on 16/01/9603   Method used:   Electronically to        [facility] # [phone]* (retail)       [address]       [place], [place]  [address]       Ph: [phone]       Fax: [phone]   RxID:   [id number]

## 2010-05-13 NOTE — Letter (Signed)
Summary: Results Follow-up Letter  [place] [facility]  [address] [place], [place] [address]   Phone: [phone]  Fax: [phone]    10/23/2009  [address] [place], [place]  [address]  Dear Christina [name],   The following are the results of your recent test(s):  Test     Result     Pap Smear    Normal____yes___    Sincerely,      Dr. Nell Range  [place] at [facility]

## 2010-05-15 NOTE — Letter (Signed)
Summary: [facility]  [facility]   Imported By: Lamona Curl CMA (AAMA) 04/16/2010 16:13:31  _____________________________________________________________________  External Attachment:    Type:   Image     Comment:   External Document

## 2010-05-15 NOTE — Assessment & Plan Note (Signed)
Summary: severe right side pain...em    History of Present Illness Visit Type: Follow-up Visit Primary GI MD: Lina Sar MD Primary Provider: Massie Maroon, MD and Rickard Patience, MD  Requesting Provider: n/a Chief Complaint: Severe right side pain, and frequent BMs or no BMs at all History of Present Illness:   This is a 76 year old white female with a 2 month history of right upper quadrant abdominal discomfort which bothers her when she exercises and also at night. It is not dependent on eating. She uses heating pad  for it and some Tylenol. There has been change in her bowel habits to  more frequent stools alternating with constipation for which she takes MiraLax. A CT scan of the abdomen was normal except for a hiatal hernia. Her last colonoscopy of July 2010 showed mild diverticulosis of the left colon. The patient says that it bothers her when she exercises, especially with water aerobics and lifting weights. Her last upper endoscopy in June 2009 showed a 3 cm hiatal hernia and mild inflammation in the esophagus. She is status post laparoscopic cholecystectomy for biliary dyskinesia in January 2006. She is due for a a repeat colonoscopy in 2015.   GI Review of Systems    Reports abdominal pain.     Location of  Abdominal pain: right side.    Denies acid reflux, belching, bloating, chest pain, dysphagia with liquids, dysphagia with solids, heartburn, loss of appetite, nausea, vomiting, vomiting blood, weight loss, and  weight gain.      Reports hemorrhoids.     Denies anal fissure, black tarry stools, change in bowel habit, constipation, diarrhea, diverticulosis, fecal incontinence, heme positive stool, irritable bowel syndrome, jaundice, light color stool, liver problems, rectal bleeding, and  rectal pain.    Current Medications (verified): 1)  Bisoprolol-Hydrochlorothiazide 2.5-6.25 Mg  Tabs (Bisoprolol-Hydrochlorothiazide) .... Once Daily 2)  Fexofenadine Hcl 60 Mg  Tabs  (Fexofenadine Hcl) .... As Needed 3)  Diazepam 5 Mg  Tabs (Diazepam) .... As Needed 4)  Miralax   Powd (Polyethylene Glycol 3350) .Marland Kitchen.. 1 Scoop Once Daily As Needed Constipation 5)  Lipitor 80 Mg  Tabs (Atorvastatin Calcium) .Marland Kitchen.. 1 By Mouth Once Daily 6)  Bayer Aspirin 325 Mg  Tabs (Aspirin) .... Once Daily 7)  Alprazolam 0.25 Mg  Tbdp (Alprazolam) .Marland Kitchen.. 1 Three Times A Day As Needed Stress 8)  Accu-Chek Compact Test Drum  Strp (Glucose Blood) .... Check Once Daily 9)  Fish Oil 1000 Mg Caps (Omega-3 Fatty Acids) .... 2 Caps Qam 10)  B Complex  Tabs (B Complex Vitamins) .Marland Kitchen.. 1 Tab When Pt Feels Tired 11)  Folic Acid 1 Mg Tabs (Folic Acid) .Marland Kitchen.. 1 Tab Once Daily 12)  Analpram-Hc Singles 1-2.5 % Crea (Hydrocortisone Ace-Pramoxine) .... As Needed 13)  Levoxyl 50 Mcg Tabs (Levothyroxine Sodium) .Marland Kitchen.. 1 Qd  Allergies (verified): 1)  ! * Codeine 2)  ! Niacin  Past History:  Past Medical History: ADENOCARCINOMA, COLON, FAMILY HX (ICD-V16.0) VITAMIN D DEFICIENCY (ICD-268.9) CAD, NATIVE VESSEL (ICD-414.01) CHEST PAIN, EXERTIONAL (ICD-786.50) HYPERTENSION (ICD-401.9) HYPERLIPIDEMIA (ICD-272.4) LOW BACK PAIN SYNDROME (ICD-724.2) SPECIAL SCREENING MALIG NEOPLASMS OTHER SITES (ICD-V76.49) DEPRESSION (ICD-311) FREQUENCY, URINARY (ICD-788.41) ANEMIA (ICD-285.9) UNS ADVRS EFF OTH RX MEDICINAL&BIOLOGICAL SBSTNC (ICD-995.29) INSOMNIA (ICD-780.52) SCREENING FOR MALIGNANT NEOPLASM OF THE CERVIX (ICD-V76.2) UTI (ICD-599.0) OSTEOPENIA (ICD-733.90) DIVERTICULOSIS OF COLON (ICD-562.10) COLONIC POLYPS, ADENOMATOUS, HX OF (ICD-V12.72) EXTERNAL HEMORRHOIDS (ICD-455.3) DIABETES MELLITUS, TYPE II (ICD-250.00) HYPOTHYROIDISM (ICD-244.9)      Past Surgical History: Reviewed history from 10/03/2009 and no  changes required. Appendectomy-1961 Cholecystectomy-2006 Hysterectomy-1974 Rotator cuff repair-1999, 2001 shoulder repair by Dr. Eulah Pont  Family History: Family History of Diabetes:  Sister,brother,grandparents Family History of Heart Disease: Brother Family History of Colon Cancer:MGM   Social History: Reviewed history from 09/07/2007 and no changes required. Divorced Occupation: retired Patient has never smoked.  Alcohol Use - yes 1 glass of wine a day Illicit Drug Use - no  Review of Systems       The patient complains of arthritis/joint pain, back pain, fatigue, and muscle pains/cramps.  The patient denies allergy/sinus, anemia, anxiety-new, blood in urine, breast changes/lumps, change in vision, confusion, cough, coughing up blood, depression-new, fainting, fever, headaches-new, hearing problems, heart murmur, heart rhythm changes, itching, menstrual pain, night sweats, nosebleeds, pregnancy symptoms, shortness of breath, skin rash, sleeping problems, sore throat, swelling of feet/legs, swollen lymph glands, thirst - excessive, urination - excessive, urination changes/pain, urine leakage, vision changes, and voice change.         Pertinent positive and negative review of systems were noted in the above HPI. All other ROS was otherwise negative.   Vital Signs:  Patient profile:   76 year old female Height:      62 inches Weight:      136 pounds BMI:     24.96 BSA:     1.62 Pulse rate:   64 / minute Pulse rhythm:   regular BP sitting:   136 / 84  (left arm) Cuff size:   regular  Vitals Entered By: Ok Anis CMA (April 17, 2010 8:25 AM)  Physical Exam  General:  Well developed, well nourished, no acute distress. Eyes:  PERRLA, no icterus. Mouth:  No deformity or lesions, dentition normal. Neck:  Supple; no masses or thyromegaly. Lungs:  Clear throughout to auscultation. Heart:  Regular rate and rhythm; no murmurs, rubs,  or bruits. Abdomen:  Soft relaxed abdomen with mild tenderness along right costal margin and to the right middle quadrant. The pain is worse by lifting, sitting up and lying down. There is no tenderness or pain in costovertebral  angle. Liver edge is at the costal margin. I cannot appreciate any hernia when she is standing. There is no bruit. Left lower and upper quadrants are unremarkable. Rectal:  normal rectal exam with Hemoccult-negative stool. Msk:  positive straight leg rising. Extremities:  no edema. Skin:  Intact without significant lesions or rashes. Psych:  Alert and cooperative. Normal mood and affect.   Impression & Recommendations:  Problem # 1:  ADENOCARCINOMA, COLON, FAMILY HX (ICD-V16.0) She is up-to-date on her colonoscopy. Her last exam was in July 2010, her next exam will be due in July 2015.  Problem # 2:  LOW BACK PAIN SYNDROME (ICD-724.2) Patient has chronic low back pain and right middle quadrant abdominal pain almost certainly musculoskeletal. I have advised she take Flexeril 10 mg p.r.n., heating pad and ibuprofen. I advised over to avoid exercises which seem to precipitate the pain.  Problem # 3:  DIVERTICULOSIS OF COLON (ICD-562.10) To regulate her bowel habits, I advised her to take Metamucil and give her samples of a probiotic. She takes MiraLax p.r.n.  Patient Instructions: 1)  Flexeril 10 mg p.o. p.r.n. We will send this to your pharmacy. 2)  Ibuprofen p.r.n. abdominal strain. 3)  Apply heating pad to pulled muscle. 4)  Avoid exercises which precipitate pain. 5)  A recall colonoscopy will be due in July 2015. 6)  Samples of a Align have been given to you. 7)  Samples of Analpram have been given to you to apply as needed to your rectum. 8)  Copy sent to : Dr Selena Batten 9)  The medication list was reviewed and reconciled.  All changed / newly prescribed medications were explained.  A complete medication list was provided to the patient / caregiver. Prescriptions: FLEXERIL 10 MG TABS (CYCLOBENZAPRINE HCL) Take 1 tablet by mouth three times a day as needed  #60 x 1   Entered by:   Lamona Curl CMA (AAMA)   Authorized by:   Hart Carwin MD   Signed by:   Lamona Curl CMA  (AAMA) on 04/17/2010   Method used:   Electronically to        [facility] [phone]* (retail)       [address]       [place], [place]  [address]       Ph: [phone]       Fax: [phone]   RxID:   [id number]

## 2010-05-15 NOTE — Letter (Signed)
Summary: [facility]   Imported By: Lamona Curl CMA (AAMA) 04/16/2010 16:08:13  _____________________________________________________________________  External Attachment:    Type:   Image     Comment:   External Document

## 2010-05-20 ENCOUNTER — Other Ambulatory Visit: Payer: Self-pay | Admitting: Family Medicine

## 2010-05-20 DIAGNOSIS — Z1231 Encounter for screening mammogram for malignant neoplasm of breast: Secondary | ICD-10-CM

## 2010-05-21 NOTE — Progress Notes (Signed)
Summary: refill request  Phone Note Refill Request Message from:  Fax from Pharmacy on May 12, 2010 4:51 PM  Refills Requested: Medication #1:  LEVOXYL 50 MCG TABS 1 qd Initial call taken by: Kern Reap CMA Duncan Dull),  May 12, 2010 4:51 PM    Prescriptions: LEVOXYL 50 MCG TABS (LEVOTHYROXINE SODIUM) 1 qd  #90 x 2   Entered by:   Kern Reap CMA (AAMA)   Authorized by:   Judithann Sheen MD   Signed by:   Kern Reap CMA (AAMA) on 05/12/2010   Method used:   Electronically to        [facility] [phone]* (retail)       [address]       [place], [place]  [address]       Ph: [phone]       Fax: [phone]   RxID:   [id number]

## 2010-06-03 ENCOUNTER — Ambulatory Visit: Payer: Self-pay

## 2010-06-10 ENCOUNTER — Ambulatory Visit
Admission: RE | Admit: 2010-06-10 | Discharge: 2010-06-10 | Disposition: A | Discharge status: Home / Self Care | Payer: Medicare Other | Source: Ambulatory Visit | Attending: Family Medicine | Admitting: Family Medicine

## 2010-06-10 DIAGNOSIS — Z1231 Encounter for screening mammogram for malignant neoplasm of breast: Secondary | ICD-10-CM

## 2010-06-12 ENCOUNTER — Ambulatory Visit (INDEPENDENT_AMBULATORY_CARE_PROVIDER_SITE_OTHER): Payer: Medicare Other | Admitting: Family Medicine

## 2010-06-12 ENCOUNTER — Encounter: Payer: Self-pay | Admitting: Family Medicine

## 2010-06-12 VITALS — BP 100/60 | HR 77 | Temp 98.4°F | Wt 136.0 lb

## 2010-06-12 DIAGNOSIS — M12559 Traumatic arthropathy, unspecified hip: Secondary | ICD-10-CM

## 2010-06-12 DIAGNOSIS — M545 Low back pain, unspecified: Secondary | ICD-10-CM

## 2010-06-12 DIAGNOSIS — G4700 Insomnia, unspecified: Secondary | ICD-10-CM

## 2010-06-12 DIAGNOSIS — M25559 Pain in unspecified hip: Secondary | ICD-10-CM

## 2010-06-12 MED ORDER — METHYLPREDNISOLONE ACETATE 80 MG/ML IJ SUSP
120.0000 mg | Freq: Once | INTRAMUSCULAR | Status: AC
  Administered 2010-06-12: 120 mg via INTRAMUSCULAR

## 2010-06-12 MED ORDER — TEMAZEPAM 30 MG PO CAPS: 30.0000 mg | ORAL_CAPSULE | Freq: Every evening | ORAL | Status: AC | PRN

## 2010-06-12 MED ORDER — HYDROCODONE-ACETAMINOPHEN 10-650 MG PO TABS: 1.0000 | ORAL_TABLET | ORAL | Status: AC | PRN

## 2010-06-12 MED ORDER — DICLOFENAC SODIUM 75 MG PO TBEC: 75.0000 mg | DELAYED_RELEASE_TABLET | Freq: Two times a day (BID) | ORAL | Status: DC

## 2010-06-18 ENCOUNTER — Encounter: Payer: Self-pay | Admitting: Family Medicine

## 2010-06-18 NOTE — Progress Notes (Signed)
  Subjective:    Patient ID: Christina [name], female    DOB: November 15, 1934, 76 y.o.   MRN: [id number] This 76 year old white divorced female is in today complaining of pain in her right heel she fell in November went to see Dr. Orvan Falconer who after reviewing note problem no fracture but no treatment. She had been having pain since that time she was also seen by a gynecologist who gave her a clear h Findings of note problems in the pelvis. She is in today continuing to complain of pain in the right heel but as well as insomnia and low back pain which she has had previously repaired emotionally has been done her well other than somewhat depressed over the fact of the persisting pain    HPI    Review of Systemssee history and physical    Objective:   Physical Exam This white female appears to be having pain but in no acute distress, cooperative examination of the chest heart and lungs negative abdominal examination negative liver spleen kidneys are nonpalpable normal, with tenderness nor masses Tenderness over the right hip joint as well as tenderness over the right sacroiliac joint  Examination of the knee ankle and feet normal       Assessment & Plan:  Past ill patient has acute inflammation of the right hip secondary to trauma as well as persisting low back pain with sacroiliac irritation. Has problem with insomnia and will treat with temazepam Has had pain with no analgesics and will treat with hydrocodone

## 2010-06-18 NOTE — Patient Instructions (Signed)
After you have traumatic arthritis and he he'll follow in the fall in November also feel that you have strained the hip right hip with traumatic arthritis despite the negative x-ray. Will treat her insomnia with temazepam as well as prescription for hydrocodone for pain. Likely to start diclofenac 75 mg twice daily for inflammation however review the x-ray studies

## 2010-06-22 ENCOUNTER — Other Ambulatory Visit: Payer: Self-pay | Admitting: Family Medicine

## 2010-06-24 NOTE — Telephone Encounter (Signed)
Refilled x 2 

## 2010-06-26 ENCOUNTER — Telehealth: Payer: Self-pay | Admitting: *Deleted

## 2010-06-26 ENCOUNTER — Encounter: Payer: Self-pay | Admitting: Family Medicine

## 2010-06-26 DIAGNOSIS — M25559 Pain in unspecified hip: Secondary | ICD-10-CM

## 2010-06-26 NOTE — Telephone Encounter (Signed)
Pt would like to have a MRI as she back is still hurting, and she is getting discouraged.

## 2010-06-26 NOTE — Telephone Encounter (Signed)
Correction to H&P Heel was in report instead  Of rt hip, corrected

## 2010-06-26 NOTE — Progress Notes (Signed)
  Subjective:    Patient ID: Christina [name], female    DOB: 02-Feb-1935, 76 y.o.   MRN: [id number]  HPI    Review of Systems     Objective:   Physical Exam        Assessment & Plan:

## 2010-06-30 LAB — POCT I-STAT, CHEM 8
Calcium, Ion: 1.1 mmol/L — ABNORMAL LOW (ref 1.12–1.32)
Glucose, Bld: 122 mg/dL — ABNORMAL HIGH (ref 70–99)
HCT: 45 % (ref 36.0–46.0)
Hemoglobin: 15.3 g/dL — ABNORMAL HIGH (ref 12.0–15.0)

## 2010-07-01 ENCOUNTER — Telehealth: Payer: Self-pay | Admitting: Family Medicine

## 2010-07-01 NOTE — Telephone Encounter (Signed)
Pt states she is still not feeling any better; needs some relief and would like for Dr. Scotty Court to order an MRI of her hip.  Please advise

## 2010-07-07 ENCOUNTER — Other Ambulatory Visit: Payer: Self-pay | Admitting: Family Medicine

## 2010-07-07 ENCOUNTER — Other Ambulatory Visit: Payer: Self-pay

## 2010-07-07 DIAGNOSIS — M25551 Pain in right hip: Secondary | ICD-10-CM

## 2010-07-08 ENCOUNTER — Other Ambulatory Visit: Payer: Self-pay | Admitting: Family Medicine

## 2010-07-08 DIAGNOSIS — M25551 Pain in right hip: Secondary | ICD-10-CM

## 2010-07-10 ENCOUNTER — Other Ambulatory Visit: Payer: Self-pay | Admitting: Family Medicine

## 2010-07-10 DIAGNOSIS — M25551 Pain in right hip: Secondary | ICD-10-CM

## 2010-07-17 ENCOUNTER — Ambulatory Visit
Admission: RE | Admit: 2010-07-17 | Discharge: 2010-07-17 | Disposition: A | Discharge status: Home / Self Care | Payer: Medicare Other | Source: Ambulatory Visit | Attending: Family Medicine | Admitting: Family Medicine

## 2010-07-17 DIAGNOSIS — M25551 Pain in right hip: Secondary | ICD-10-CM

## 2010-07-17 NOTE — Telephone Encounter (Signed)
Rescheduled mri

## 2010-07-22 ENCOUNTER — Telehealth: Payer: Self-pay | Admitting: Family Medicine

## 2010-07-22 DIAGNOSIS — M5431 Sciatica, right side: Secondary | ICD-10-CM

## 2010-07-22 NOTE — Telephone Encounter (Signed)
Called and will order mri

## 2010-07-22 NOTE — Telephone Encounter (Signed)
Pt would like mri results

## 2010-07-23 NOTE — Telephone Encounter (Signed)
To get mri lumbosacral spine

## 2010-07-25 ENCOUNTER — Encounter: Payer: Self-pay | Admitting: Family Medicine

## 2010-07-25 ENCOUNTER — Ambulatory Visit
Admission: RE | Admit: 2010-07-25 | Discharge: 2010-07-25 | Disposition: A | Discharge status: Home / Self Care | Payer: Medicare Other | Source: Ambulatory Visit | Attending: Family Medicine | Admitting: Family Medicine

## 2010-07-25 DIAGNOSIS — M5431 Sciatica, right side: Secondary | ICD-10-CM

## 2010-07-29 ENCOUNTER — Telehealth: Payer: Self-pay | Admitting: Family Medicine

## 2010-07-29 NOTE — Telephone Encounter (Signed)
PT WOULD LIKE MRI OF BACK RESULTS

## 2010-07-30 ENCOUNTER — Other Ambulatory Visit: Payer: Self-pay | Admitting: Family Medicine

## 2010-07-30 DIAGNOSIS — IMO0002 Reserved for concepts with insufficient information to code with codable children: Secondary | ICD-10-CM

## 2010-07-30 DIAGNOSIS — M5126 Other intervertebral disc displacement, lumbar region: Secondary | ICD-10-CM

## 2010-08-12 ENCOUNTER — Other Ambulatory Visit: Payer: Self-pay | Admitting: Family Medicine

## 2010-08-12 MED ORDER — DIAZEPAM 5 MG PO TABS: 5.0000 mg | ORAL_TABLET | Freq: Four times a day (QID) | ORAL | Status: DC | PRN

## 2010-08-12 NOTE — Telephone Encounter (Signed)
Ok per Dr. Scotty Court  to fill diazepam 10 mg with 5 refills

## 2010-08-12 NOTE — Telephone Encounter (Signed)
Pt called and is req refill for Diazepam 5 mg to [facility] on [address].

## 2010-08-12 NOTE — Telephone Encounter (Signed)
called

## 2010-08-13 ENCOUNTER — Encounter: Payer: Self-pay | Admitting: Cardiology

## 2010-08-14 ENCOUNTER — Encounter: Payer: Self-pay | Admitting: Cardiology

## 2010-08-14 ENCOUNTER — Ambulatory Visit (INDEPENDENT_AMBULATORY_CARE_PROVIDER_SITE_OTHER): Payer: Medicare Other | Admitting: Cardiology

## 2010-08-14 VITALS — BP 132/82 | HR 62 | Resp 18 | Ht 63.0 in | Wt 129.1 lb

## 2010-08-14 DIAGNOSIS — I2510 Atherosclerotic heart disease of native coronary artery without angina pectoris: Secondary | ICD-10-CM

## 2010-08-14 NOTE — Assessment & Plan Note (Signed)
Stable. No change in treatment. If needs surgery, we will clear her.

## 2010-08-14 NOTE — Patient Instructions (Signed)
Your physician recommends that you schedule a follow-up appointment in: 1 year with Dr. Wall  

## 2010-08-14 NOTE — Progress Notes (Signed)
   Patient ID: Christina [name], female    DOB: January 03, 1935, 76 y.o.   MRN: [id number]  HPI  Christina [name] returns for E and M of her CAD and chronic stable angina. She has had a few episodes of exertional angina since last visit. It's promptly relieved with SL NTG. She may need lumbar disc surgery with Dr Danielle Dess. I will clear her.  EKG shows NSR with no EKG changes.    Review of Systems  All other systems reviewed and are negative.      Physical Exam  Nursing note and vitals reviewed. Constitutional: She is oriented to person, place, and time. She appears well-developed and well-nourished. No distress.  HENT:  Head: Normocephalic and atraumatic.  Eyes: EOM are normal. Pupils are equal, round, and reactive to light.  Neck: Normal range of motion. Neck supple. No JVD present. No tracheal deviation present. No thyromegaly present.  Cardiovascular: Normal rate, regular rhythm, S1 normal, S2 normal and normal pulses.   No extrasystoles are present. PMI is not displaced.  Exam reveals no decreased pulses.   Murmur heard.      S2 splits  Pulmonary/Chest: Effort normal and breath sounds normal.  Abdominal: Soft. Bowel sounds are normal.  Musculoskeletal: Normal range of motion.  Neurological: She is alert and oriented to person, place, and time.  Skin: Skin is warm and dry.  Psychiatric: She has a normal mood and affect.

## 2010-08-26 NOTE — Assessment & Plan Note (Signed)
[facility]                            CARDIOLOGY OFFICE NOTE   DANAYSHA, KIRN                       MRN:          [id number]  DATE:04/23/2008                            DOB:          1935/04/03    Ms. Fanton comes in today for followup.  She still has some exertional  angina, but is working on a regular basis.  She has gotten over some of  the anxiety of this.  In addition, she occasionally has a little  tightness in her chest that lasts few minutes when she first wakes up.  She has not had to take nitroglycerin.  She has had no true rest pain  during the day.   Please see my previous notes for extensive details concerning her  coronary anatomy by cath in March 2009.  In addition, she had a negative  stress Myoview with excellent exercise tolerance, EF 75% with no  ischemia in March 2009 as well.  We have been treating her medically.   She did not seek a second opinion.   Her meds are really unchanged since her last visit.  She is followed by  Dr. Dianna Limbo who recently checked her lipid values.   PHYSICAL EXAMINATION:  Today, she looks much younger than stated age.  Her blood pressure is 130/80, her pulse 69 and regular.  EKG is normal.  HEENT is normal.  Carotid upstrokes were equal bilaterally without  bruits.  No JVD.  Thyroid is not enlarged.  Trachea is midline.  Lungs  are clear to auscultation and percussion.  Heart reveals a nondisplaced  PMI.  Normal S1 and S2.  No murmur, rub, or gallop.  Abdominal exam is  soft, good bowel sounds.  No midline bruit.  No hepatomegaly.  Extremities reveal no cyanosis, clubbing, or edema.  Pulses are intact.  Neuro exam is intact.  Skin is unremarkable.   ASSESSMENT AND PLAN:  Ms. Duvall is still doing remarkably well with her  coronary disease.  We have made no changes in her medical program.  I  have told her that if her angina becomes more easily provoked, occurs  more, and begins to occur  at rest or in the morning requiring  nitroglycerin, we need to restudy her.  Hopefully, this will remain  stable for quite some time with her medical program.  I will plan on  seeing her back in a year.     Thomas C. Daleen Squibb, MD, [facility]  Electronically Signed    TCW/MedQ  DD: 04/23/2008  DT: 04/24/2008  Job #: [id number]

## 2010-08-26 NOTE — Cardiovascular Report (Signed)
[name], [name]                ACCOUNT NO.:  [id number]   MEDICAL RECORD NO.:  [id number]          PATIENT TYPE:  INP   LOCATION:  4729                         FACILITY:  [facility]   PHYSICIAN:  Veverly Fells. Excell Seltzer, MD  DATE OF BIRTH:  [birth year]   DATE OF PROCEDURE:  06/08/2007  DATE OF DISCHARGE:                            CARDIAC CATHETERIZATION   PROCEDURES:  1. Left heart catheterization.  2. Selective coronary angiography.  3. Left ventricular angiography.   INDICATIONS:  Christina [name] is a 76 year old woman who presented to the  office with typical chest pain in a crescendo pattern.  She has had  exertional symptoms that have progressed to resting symptoms.  She has  multiple risk factors.  She was referred for cardiac catheterization.  Her initial objective markers with cardiac enzymes and EKGs have been  unremarkable.   Risks and indications of the procedure were reviewed with the patient.  Informed consent was obtained.  The right groin was prepped, draped,  anesthetized with 1% lidocaine using modified Seldinger technique.  A 6-  French sheath was placed in the right femoral artery.  Standard 6-French  Judkins catheters were used for selective coronary angiography.  An  angled pigtail catheter was used for left ventriculography as well as  aortic root angiography.  The patient tolerated the procedure well and  had no immediate complications.   FINDINGS:  Aortic pressure 131/55 with a mean of 89, left ventricular  pressure 129/8.   CORONARY ANGIOGRAPHY:  The left mainstem has an angulated origin.  There  is 30% proximal left mainstem stenosis.  The remaining portions of the  left mainstem are angiographically normal.  The left main bifurcates  into the LAD and left circumflex.   The LAD is a large-caliber vessel that courses down and reaches the LV  apex and supplies a large first diagonal branch and small septal  perforating branches.  The proximal LAD prior to the first  septal  perforator has mild nonobstructive plaque.  There is no significant  stenosis throughout the LAD or diagonal branch.   The left circumflex is a small vessel.  There is moderate ostial  stenosis of approximately 75%.  The remaining portions of the circumflex  other than the ostium had no significant stenosis.  It supplies two  small OM branches and a very small AV groove circumflex.  In some views  the ostial stenosis is hazy-appearing, in other views it appears smooth.   Right coronary artery is dominant.  There is 30% stenosis in the  midportion of the vessel.  It supplies a PDA and two posterolateral  branches.  There are no other significant stenoses throughout the right  coronary artery.   Left ventriculography shows normal LV function.  The LVEF is 65%.   Aortic root angiography demonstrates no aortic insufficiency, normal-  sized proximal aorta.  No anomalous coronary arteries are identified.   ASSESSMENT:  1. Moderate ostial left circumflex stenosis involving the small left      circumflex.  2. Nonobstructive plaque in the left mainstem, left anterior  descending artery and right coronary artery.  3. Normal left ventricular function.   I recommend medical therapy for Christina [name]'s coronary artery disease.  It  is possible that the ostial circumflex stenosis as a culprit for her  symptoms, although she is having resting chest pain and has normal flow  through this vessel and I would not expect rest angina from this type of  moderate lesion.  I am going to review her films with Dr. Juanda Chance for  another opinion, but I do not think her circumflex is favorable for PCI  in the setting of a small vessel and the ostial nature of the disease.      Veverly Fells. Excell Seltzer, MD  Electronically Signed     MDC/MEDQ  D:  06/08/2007  T:  06/09/2007  Job:  [id number]

## 2010-08-26 NOTE — Assessment & Plan Note (Signed)
[facility]                            CARDIOLOGY OFFICE NOTE   ALISHAH, SCHULTE                       MRN:          [id number]  DATE:06/21/2007                            DOB:          15-Aug-1934    Ms. Forrey returns today after having a cardiac catheterization for  exertional angina.   Her catheterization showed normal left ventricular systolic function,  she had a 56% proximal left main stenosis which was somewhat angulated,  she had a LAD that was large with no significant obstructive plaque, she  had a small left circumflex with a moderate ostial stenosis of 75%, and  right coronary artery had 30% mid stenosis.  She had normal left  ventricular function.  Aortic root demonstrated no aortic insufficiency,  normal sinus proximal aorta and no anomalous coronary vessels.   She is still having angina.  She had a couple episodes at rest.  It  responds to nitroglycerin.   She is extremely health conscious and motivated.  She is also very  bright.  She asked a whole host of very good questions today.   She controls her sugar with diet.  She enjoys exercising, but is afraid  to now.  The first time she exercised when she got home she had angina.  She has hypertension,  but that has been under good control.  She has  hyperlipidemia and changed her lovastatin 40 to  Lipitor 80.  She will  need blood work in about 4 weeks.  She takes aspirin 325 mg a day.   Her medications are listed the chart.  Her only change is the  lovastatin.  She carries sublingual nitroglycerin.   PHYSICAL EXAMINATION:  Blood pressure is 120/80, pulse 55 and she is in  sinus brady. EKG confirms. Weight is 133, up 2.  HEENT:  Unchanged.  Carotid upstrokes were equal bilaterally without  bruits, no JVD.  Thyroid is not enlarged.  Trachea is midline.  LUNGS:  Clear.  HEART:  Reveals a nondisplaced PMI.  She has slow rate and rhythm.  Normal S1-S2; no murmur.  ABDOMEN:  Soft,  no midline bruit.  No hepatomegaly.  Her groin site is  stable.  EXTREMITIES:  No sinus clubbing or edema.  Pulses are intact.   I have spent about 20 plus minutes talking to Mrs. Scroggs and answering  questions.  We reviewed her anatomy at length using a heart model.  I  showed her the takeoff to the left main as well as the circumflex and  how this would be extraordinarily difficult to perform percutaneous  coronary intervention.  This had been discussed with Dr. Excell Seltzer and also  Dr. Juanda Chance.   At this point, she is clearly not functionally active.  She really wants  to get back into an exercise program,  which I will also want for her.  She has had a couple episodes of chest discomfort with rest.  She is  well beta blocked.    Before we add any other medications I would like to get an exercise  rest/stress Myoview.  If she  has any ischemia in the circumflex  distribution or even in the anterior distribution with 30% angulated  left main, I think she needs to be referred for coronary artery bypass  grafting.   I will set this up in the near future.  Will discuss it afterwards. I  answered the other questions she has.     Thomas C. Daleen Squibb, MD, [facility]  Electronically Signed    TCW/MedQ  DD: 06/21/2007  DT: 06/21/2007  Job #: [id number]   cc:   Ellin Saba., MD

## 2010-08-26 NOTE — Assessment & Plan Note (Signed)
[place] [facility]                            CARDIOLOGY OFFICE NOTE   [name], [name]                       MRN:          [id number]  DATE:06/07/2007                            DOB:          1935/03/04    CHIEF COMPLAINT:  Chest tightness, shortness of breath, hurting in my  arms when I exercise on the treadmill.   HISTORY OF PRESENT ILLNESS:  Ms. Christina [name] is a delightful, very  active 76 year old white female who for the last couple of weeks has had  clear cut exertional angina as described above.   She has had three episodes of rest pain requiring two nitroglycerin  yesterday that she borrowed from a friend.   She saw Dr. Cecille Rubin in the office today who called me.  We worked  her in the office.   Her past medical history cardiac risk factors are pertinent for age,  sex, hyperlipidemia on treatment, hypertension and diet controlled  diabetes.   CURRENT MEDICATIONS:  1. Levoxyl 75 mcg a day.  2. Bisoprolol/HCTZ 2.5/6.25 daily.  3. Diazepam 5 mg daily.  4. Lovastatin 40 mg daily.  5. Restoril 30 mg q.h.s.   PREVIOUS MEDICAL HISTORY:  1. Hypothyroidism.  2. Hypertension.  3. Hyperlipidemia.  4. Previous history of external hemorrhoids.   PAST SURGICAL HISTORY:  1. Vaginal hysterectomy in 1974.  2. Appendectomy 1961.  3. Cholecystectomy 2006.  4. Rotator cuff surgery 1999 and 2001.   ALLERGIES:  NO KNOWN DRUG ALLERGIES.  SHE IS INTOLERANT OF CODEINE.   SOCIAL HISTORY:  She is widowed.  She is very active.  Her son is here  with her today.  She lives in [place].   REVIEW OF SYSTEMS:  Other than in the HPI, negative.   PHYSICAL EXAMINATION:  VITAL SIGNS:  Blood pressure 145/82, pulse 57 and  regular, EKG in Dr. Charmian Muff office this morning showed sinus rhythm  without any ST segment changes.  She has slight left axis deviation.  Weight 131.  HEENT:  Normocephalic, atraumatic.  PERRLA.  EOMI.  Sclerae are clear.  Facial  symmetry is normal.  Carotid upstrokes are equal bilaterally  without bruits.  No JVD.  Thyroid is not enlarged.  Trachea is midline.  LUNGS:  Clear.  HEART:  Nondisplaced PMI, normal S1, S2 without gallops.  ABDOMEN:  Soft, good bowel sounds, no midline bruits, no tenderness, no  hepatomegaly, no organomegaly.  EXTREMITIES:  No cyanosis, clubbing or edema.  Pulses are intact.  NEUROLOGICAL:  Intact.   ASSESSMENT:  1. Unstable angina.  2. Hyperlipidemia.  3. Hypertension.  4. Diet treated diabetes mellitus.  5. Hypothyroidism.   I have recommended hospitalization with diagnostic cath.  Indications,  risks, potential benefits have been discussed.  Patient agrees to  proceed.  We will get her on intravenous heparin, start aspirin as well  as intravenous nitroglycerin.  We will check to see if she can be  catheterized this afternoon.  I will give Dr.  Scotty Court a call.     Thomas C. Daleen Squibb, MD, [facility]  Electronically Signed    TCW/MedQ  DD: 06/07/2007  DT: 06/07/2007  Job #: [id number]   cc:   Christina [name]., MD  Christina Sans Wall, MD, [facility]

## 2010-08-26 NOTE — Assessment & Plan Note (Signed)
[facility]                            CARDIOLOGY OFFICE NOTE   Christina [name], Christina [name]                       MRN:          [id number]  DATE:09/20/2007                            DOB:          01-07-1935    Christina [name] returns today for further management of her coronary artery  disease and exertional angina.   Please see my previous note on June 21, 2007, for details.  Essentially, she has a 75% ostial stenosis in a small left circumflex  and nonobstructive disease, otherwise.  She does have a 30% proximal  left main.  She had a stress Myoview on June 27, 2007, exercised for 9  minutes, peak heart rate of 136 beats per minute, which is 92% of  predicted maximum heart rate.  MET level achieved was 10.1, EF 75%, no  ischemia, good contractility of all areas of the myocardium.  She is  still having some exertional angina and takes about 1 nitro a week.  She  is still very active, working out.   She is thinking about seeking a second opinion.  She has an opinion with  Dr. Verdis Prime today and I said I would welcome that.   MEDICATIONS:  1. Levoxyl 75 mcg a day.  2. Bisoprolol/HCTZ 2.5/6.25 mg daily.  3. Diazepam 5 mg a day.  4. Calcium 600 mg a day.  5. Vitamin D 4000 units a day.  6. B complex.  7. Folic acid.  8. Enteric-coated aspirin 325 mg a day.  9. Protonix 40 mg a day.  10.Lipitor 40 mg q.h.s.  11.Fish oil 1000 mg t.i.d.  12.She carries sublingual nitroglycerin.   PHYSICAL EXAMINATION:  She looks remarkably good today.  Her blood  pressure is 145/85, pulse is 58 and regular.  Her weight is 131, down 2.  HEENT:  Unchanged.  Carotid upstrokes are equal bilaterally without  bruits.  No JVD.  Thyroid is not enlarged.  Trachea is midline.  Neck is  supple.  LUNGS:  Clear.  HEART:  Reveals a regular rate and rhythm.  No S4.  No murmur.  ABDOMEN:  Soft.  Good bowel sounds.  No midline bruit.  No hepatomegaly.  EXTREMITIES:  Without sinus,  clubbing, or edema.  Pulses are intact.  NEURO:  Intact.  SKIN:  Unremarkable except hand.   I have had a long talk with Christina [name] today.  I have answered all her  questions and reviewed again her coronary anatomy and her stress Myoview  and its implications.  I would welcome a second opinion from Dr. Verdis Prime.  I have asked her to ask Dr. Dianna Limbo to refer her, so  that her insurance will hopefully cover this.   She is now taking Lipitor 40 instead of 80.  Her LDL was 74 on the 80.  She is having followup blood work with Dr. Scotty Court.  I have told her  that if her LDL is higher, which she should be on 40, she should either  go back to 80 of Lipitor and take it in the morning (she says  it upsets  her stomach) or get a Crestor 40 mg daily.  I will see her back again in  6 months if she chooses.     Thomas C. Daleen Squibb, MD, [facility]  Electronically Signed    TCW/MedQ  DD: 09/20/2007  DT: 09/21/2007  Job #: [id number]   cc:   Ellin Saba., MD

## 2010-08-26 NOTE — Assessment & Plan Note (Signed)
[facility]                            CARDIOLOGY OFFICE NOTE   CINTIA, GLEED                       MRN:          [id number]  DATE:06/30/2007                            DOB:          1935/01/23    Ms. Allshouse comes in today because her coronary disease and exertional  angina.  She had it once on the treadmill since I last saw her.  Please  refer to that note on June 21, 2007.   We did a stress Myoview to see if we could prove any ischemia for  potential bypass surgery.   As expected, she exercised a remarkably well to 9 minutes.  She had some  mild chest tightness.  There were nonspecific ST-segment changes.  Her  peak heart rate was 137 beats per minute which is 90% predicted maximum  heart rate.  MET level achieved was 10.1.   Her ejection fraction 75%, normal contractility and thickening in all  areas of myocardium, and no ischemia.   I have reviewed the findings at length with Mrs. Begeman today.  We have  answered all her questions.  I have asked her to come back in 2 weeks  for lipids and LFTs on 80 of Lipitor.  She will continue her aspirin.  She will keep her nitroglycerin fresh.  She will also continue her  bisoprolol hydrochlorothiazide.   I think this is a very low risk scan and portends a good prognosis.  I  have tried to reassure her of this.  I will plan on seeing her back in 3  months.   If she begins to have more frequent angina with exertion or she has  unstable symptoms, then we will reconsider revascularization.  Hopefully  will not have to do this for sometime.     Thomas C. Daleen Squibb, MD, [facility]  Electronically Signed    TCW/MedQ  DD: 06/30/2007  DT: 06/30/2007  Job #: [id number]   cc:   Ellin Saba., MD

## 2010-08-26 NOTE — Discharge Summary (Signed)
Christina [name], Christina [name]                ACCOUNT NO.:  [id number]   MEDICAL RECORD NO.:  [id number]          PATIENT TYPE:  INP   LOCATION:  4729                         FACILITY:  [facility]   PHYSICIAN:  Christina C. Wall, MD, FACCDATE OF BIRTH:  1934/11/30   DATE OF ADMISSION:  74/24/2009  DATE OF DISCHARGE:  74/26/2009                               DISCHARGE SUMMARY   PRIMARY CARDIOLOGIST:  Christina Castle, MD.   PRIMARY CARE Christina [name]:  Christina Limbo, MD.   DISCHARGE DIAGNOSIS:  Chest pain.   SECONDARY DIAGNOSES:  1. Nonobstructive coronary artery disease.  2. Hypertension.  3. Hyperlipidemia.  4. Diet-controlled diabetes mellitus.  5. Hypothyroidism.  6. History of external hemorrhoids.  7. Status post vaginal hysterectomy in 1974.  8. Status post appendectomy in 1961.  9. Status post cholecystectomy in 2006.  10.Status post rotator cuff surgery in 1999 and 2001.   ALLERGIES:  INTOLERANCE TO CODEINE.   PROCEDURES:  1. Left heart cardiac catheterization.   HISTORY OF PRESENT ILLNESS:  A 76 -year-old Caucasian female without  prior cardiac history.  She presented to see Dr. Daleen [name] in the office  February 74, 2009, with complaints of rest and exertional chest  discomfort requiring nitroglycerin.  The decision was made to admit her  for further evaluation and catheterization.   HOSPITAL COURSE:  [name] ruled out for MI and underwent left heart  cardiac catheterization on February 25th revealing nonobstructive  coronary artery disease.  She did have a 75% stenosis in the proximal  left circumflex; however, this was felt to be a small vessel and it was  felt that she would benefit most from medical management.  LV function  was normal with an EF of 60%.  [name] has been maintained on beta-  blocker, aspirin and statin therapy.  We have changed and increased her  statin dose to Lipitor 80 mg daily.  She is being discharged home today  in good condition.   DISCHARGE LABS:   Hemoglobin 13.8, hematocrit 40.8, WBC 7.9, platelets  182,000.  MCV 94.2, sodium 137, potassium 3.8, chloride 102, CO2 29.  BUN 8, creatinine 0.69, glucose 89, troponin 0.05, calcium 9.3.   DISPOSITION:  The patient is being discharged home today in good  condition.   FOLLOWUP PLANS AND APPOINTMENTS:  She has followup with Dr. Daleen [name] on  March 10th at 10 a.m.  She will follow up with Dr. Scotty Court as  previously scheduled.   DISCHARGE MEDICATIONS:  1. Aspirin 81 mg daily.  2. Lipitor 80 mg at bedtime.  3. Bisoprolol/HCTZ 2.5/6.25 daily.  4. MiraLax 17 g three times a week.  5. Fish Oil as previously taken.  6. Levoxyl 75 mcg daily.  7. Allegra D 60 mg p.r.n. daily.  8. Nitroglycerin 0.4 mg sublingual p.r.n. chest pain.   OUTSTANDING LAB STUDIES:  None.   DURATION OF DISCHARGE ENCOUNTER:  Forty-five minutes, including  physician time.      Nicolasa Ducking, ANP      Jesse Sans. Christina Squibb, MD, [facility]  Electronically Signed    CB/MEDQ  D:  74/26/2009  T:  74/26/2009  Job:  [id number]   cc:   Ellin Saba., MD

## 2010-08-29 NOTE — Consult Note (Signed)
Christina [name], Christina [name]                ACCOUNT NO.:  [id number]   MEDICAL RECORD NO.:  [id number]          PATIENT TYPE:  OUT   LOCATION:  GYN                          FACILITY:  [facility]   PHYSICIAN:  Paola A. Duard Brady, MD    DATE OF BIRTH:  [birth year]   DATE OF CONSULTATION:  02/16/2006  DATE OF DISCHARGE:                                   CONSULTATION   HISTORY OF PRESENT ILLNESS:  Christina [name] is a very pleasant 76 year old who  was initially seen by [place] in September 2007 at the request of Dr. Marcelle Overlie for  vaginal dysplasia.  She had a Pap smear in August that showed low grade  dysplasia with high-risk HPV typing.  Colposcopy showed acetowhite  epithelial change at the vaginal cuff cervical vaginal biopsies showed low  grade dysplasia.  She did wish to proceed with definitive surgery.  Therefore January 12, 2006, she underwent laser of the vagina.  Operative  findings included acetowhite epithelial changes on the left side of the  vaginal cuff.  She comes in today for her postoperative check.  She denies  any vaginal bleeding or discharge.  She has been using her Premarin cream  twice a week.   PHYSICAL EXAMINATION:  VITAL SIGNS:  Weight 129 pounds, blood pressure  130/78.  GENERAL:  Well-nourished, alert female in no acute distress.  PELVIC:  External genitalia is within normal limits.  The vagina is  atrophic.  The top of the vaginal cuff is visualized.  She does have a white  discharge consistent with Premarin cream utilization.  There is no gross  visible lesions, and the vagina is well healed.   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   PLAN:  Return to see [place] in 4 months for follow-up Pap smear.  She knows that  she is to continue using her estrogen cream.      Paola A. Duard Brady, MD  Electronically Signed     PAG/MEDQ  D:  02/16/2006  T:  02/17/2006  Job:  [id number]   cc:   Duke Salvia. Marcelle Overlie, M.D.  Fax: [phone]   Ellin Saba., MD  [address] [place]  [place] [address]   Telford Nab, R.N.  [address]  [place], [place] [address]

## 2010-08-29 NOTE — Consult Note (Signed)
Christina [name], MCPETERS                ACCOUNT NO.:  [id number]   MEDICAL RECORD NO.:  [id number]          PATIENT TYPE:  OUT   LOCATION:  GYN                          FACILITY:  [facility]   PHYSICIAN:  Paola A. Duard Brady, MD    DATE OF BIRTH:  [birth year]   DATE OF CONSULTATION:  12/30/2005  DATE OF DISCHARGE:                                   CONSULTATION   REFERRING PHYSICIAN:  Dr. Richarda Overlie at [facility] in  [place].   The patient is seen today in consultation request of Dr. Marcelle Overlie.  Christina [name]  is a 76 year old, gravida 2, para 2, who was in her usual state of health  and doing well until last summer at which time she had a Pap smear done by  her primary physician, Dr. Verdie Drown, Jr., that returned showing low-  grade dysplasia.  She was subsequently referred to Dr. Marcelle Overlie for  evaluation of the above.  She has been followed conservatively by Dr.  Marcelle Overlie and was treated appropriately with vaginal Premarin cream.  Most  recently, she was seen by Dr. Marcelle Overlie on August 21 at which time she had a  Pap smear that showed low-grade dysplasia with high-risk HPV typings.  A  colposcopy was performed that showed acetowhite epithelial changes on the  vaginal cuff, and she underwent cervical biopsy that revealed low-grade  dysplasia.  It is for this reason that she is referred to [place].  She did  undergo a vaginal hysterectomy in 1974 for bleeding, had been on hormone  replacement therapy until a few years ago.  She is not sure what type.  She  has always had normal Pap smears until this most recent one in June 2006.  She has had a new partner though per her report, her partner says that he  does not have anything.  She denies any bleeding.  She does occasionally  have some constipation.   REVIEW OF SYSTEMS:  She is in excellent health.  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  She denies any chest, short of  breath, nausea, vomiting, fevers, chills, vaginal  bleeding.   PAST SURGICAL HISTORY:  1. Vaginal hysterectomy 1974.  2. Appendectomy in 1961.  3. Bilateral right-sided rotator cuff surgery in 1999 and 2001.  4. Cholecystectomy in 2006.   MEDICATIONS:  Synthroid and hydrochlorothiazide, questionable dose of  medications.   PAST MEDICAL HISTORY:  1. Hypothyroidism.  2. Hypertension.   ALLERGIES:  CODEINE which causes itching.   SOCIAL HISTORY:  She denies any use of tobacco or alcohol.  She is single  and divorced.   FAMILY HISTORY:  Significant for diabetes and coronary disease.   HEALTH MAINTENANCE:  She is up-to-date on her mammograms, her last one was  in January.  She had a colonoscopy this year.   PHYSICAL EXAMINATION:  VITAL SIGNS:  Height 5 feet 4 inches, weight 127  pounds, blood pressure 148/80, pulse 52, respirations 16.  GENERAL:  Well-nourished, alert female in no acute distress.  ABDOMEN:  Soft and nontender.  Groins  are negative for adenopathy.  EXTREMITIES:  There is no edema.  PELVIC:  External genitalia is within normal limits.  The vagina is markedly  atrophic.  It appears that she has a cystocele and a questionable  enterocele.  The vaginal cuff is visualized.  There is no gross visible  lesions.  Acetic acid was placed on the vagina.  Colposcopy was then  performed.  The patient does not have large distinct acetowhite epithelial  changes; she has small acetowhite epithelial changes throughout the vaginal  cuff in a very scattered fashion, each being approximately 2 mm in size.  Bimanual examination:  The vaginal cuff is smooth.  There are no palpable  masses.   ASSESSMENT:  A 76 year old with low-grade dysplasia.  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  I had a lengthy discussion with the patient.  I believe that her  care to this point has been exceptional.  In many ways I would continue  recommending continued Premarin or vaginal estrogen use to see if she clears  this on her own.  The patient would like to  be more proactive at this point  and would like to have laser ablation.  I discussed with her that this is a  viral infection, that many times it can be multifocal within the vagina and  that while we can laser the visible areas, there may continue to be  microscopic disease that cannot be seen and therefore she may continue to  have persistent low-grade changes on Pap smear after the laser, or she may  have periods of time where the dysplasia has regressed but then return of  the abnormal Pap smears.  Despite this discussion, the patient wishes to  have laser ablation and will be scheduled for October 2.  She states that  she did have some difficulty with the Premarin vaginal cream and was given a  prescription for Vagifem.  Her questions relating to laser were answered to  her satisfaction.  She is given my card and she knows to call me should she  have any questions.  She will preop procedures discussed with Telford Nab, R.N.      Rejeana Brock A. Duard Brady, MD  Electronically Signed     PAG/MEDQ  D:  12/30/2005  T:  01/01/2006  Job:  [id number]   cc:   Duke Salvia. Marcelle Overlie, M.D.  Fax: [phone]   Ellin Saba., MD  [address] [place]  [place] [address]   Telford Nab, R.N.  [address]  [place], [place] [address]

## 2010-08-29 NOTE — Op Note (Signed)
Christina [name], Christina [name]                ACCOUNT NO.:  [id number]   MEDICAL RECORD NO.:  [id number]          PATIENT TYPE:  AMB   LOCATION:  DAY                          FACILITY:  [facility]   PHYSICIAN:  Paola A. Duard Brady, MD    DATE OF BIRTH:  1934/05/07   DATE OF PROCEDURE:  01/12/2006  DATE OF DISCHARGE:                                 OPERATIVE REPORT   PREOPERATIVE DIAGNOSIS:  Vaginal intraepithelial neoplasia.   POSTOPERATIVE DIAGNOSIS:  Vaginal intraepithelial neoplasia.   PROCEDURE PERFORMED:  Laser the vagina.   SURGEON:  Paola A. Duard Brady, MD.   ANESTHESIA:  General.   ANESTHESIOLOGIST:  Quentin Cornwall. Council Mechanic, M.D.   SPECIMENS:  None.   ESTIMATED BLOOD LOSS:  Minimal.   URINE OUTPUT:  200 mL.   COMPLICATIONS:  None.   OPERATIVE FINDINGS:  Acetowhite epithelial changes on the left side of the  vaginal cuff.   PROCEDURE:  The patient was taken to the operating room after the patient's  name and identifiers were confirmed, procedure was confirmed.  She was  placed in supine position where general anesthesia with LMA was induced.  Timeout was then performed.  The perineum and vagina were cleansed with  Betadine cleansing solution.  A Ray-Tec with 3% acetic acid was then placed  in the vagina.  Operative findings were as above.  The laser at a watt  setting of 8, continuous, was used to ablate the vaginal mucosa in the area  that contained acetowhite epithelial changes.  With cotton __________ pads  we then wiped away the area of laser ablation, and clean mucosa was noted.  There was noted to be no bleeding.  One gram of the vaginal Premarin cream  was placed in the area of the surgical site.   A Foley catheter was then used in/out, to drain the bladder of 200 mL of  urine.  The patient tolerated the procedure well and was taken to the  recovery room in stable condition.  All Ray-Tec counts were correct x2.      Paola A. Duard Brady, MD  Electronically Signed     PAG/MEDQ  D:   01/12/2006  T:  01/13/2006  Job:  [id number]   cc:   Duke Salvia. Marcelle Overlie, M.D.  Fax: [phone]   Ellin Saba., MD  [address] [place]  [place] [address]   Telford Nab, R.N.  [address]  [place], [place] [address]

## 2010-08-29 NOTE — Consult Note (Signed)
NAMEKEYANAH, Christina [name]                ACCOUNT NO.:  [id number]   MEDICAL RECORD NO.:  [id number]          PATIENT TYPE:  OUT   LOCATION:  GYN                          FACILITY:  [facility]   PHYSICIAN:  Paola A. Duard Brady, MD    DATE OF BIRTH:  [birth year]   DATE OF CONSULTATION:  06/15/2006  DATE OF DISCHARGE:                                 CONSULTATION   HISTORY:  Christina [name] is a 76 year old who was seen by [place] in September  2007 at the request of Dr. Marcelle Overlie for vaginal dysplasia.  She had a Pap  smear in August of 2007 that showed a low-grade dysplasia with high-risk  HPV.  Colposcopy revealed an acetowhite epithelial change at the vaginal  cuff revealing low-grade dysplasia.  She wished to proceed with  definitive surgery and underwent laser of the vagina January 12, 2006.  Operative findings included an acetowhite epithelial change on the left  side of the vaginal cuff.  She was last seen by [place] in November of 2007  at which time she had a well-healed vaginal cuff; and we encouraged her  to use Premarin cream.  She comes in today for a follow up Pap smear.  She is overall doing quite well; and really has no complaints.   PAST SURGICAL HISTORY:  Vaginal hysterectomy in 1974, appendectomy in  1961, cholecystectomy in 2006, rotator cuff surgery in 1999 and 2001.   PAST MEDICAL HISTORY:  1. Hypothyroidism.  2. Hypertension.   MEDICATIONS:  1. Synthroid.  2. Hydrochlorothiazide.  3. Premarin vaginal cream.   PHYSICAL EXAMINATION:  VITAL SIGNS:  Weight 133 pounds, blood pressure  132/74.  GENERAL:  A well-nourished, well-developed female in no acute distress.  PELVIC:  External genitalia is within normal limits.  The vagina is  markedly atrophic.  The vaginal cuff is visualized.  There are no gross  visible lesions.  A ThinPrep Pap was submitted without difficulty.  Bimanual examination reveals some thickening at the top of the left  vaginal cuff.  There is no distinct mass however.   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   PLAN:  She will continue using her vaginal Premarin cream 1 gram in the  vagina q.h.s. 3x a week.  We will follow results for Pap smear from  today; and determine her disposition pending the results from today.  If  today's Pap smear  is normal, she can return to see [place] in 6 months.  If it is abnormal she  will need to return to see [place] in 4 months.  The patient will be  traveling to [place] for 2 weeks and will call us upon her return, to  discuss the results; and determine her follow-up appointment.      Paola A. Duard Brady, MD  Electronically Signed     PAG/MEDQ  D:  06/15/2006  T:  06/15/2006  Job:  [id number]   cc:   Duke Salvia. Marcelle Overlie, M.D.  Fax: [phone]   Ellin Saba., MD  [address] [place]  [place] [address]   Telford Nab,  R.N.  [address]  [place], [place] [address]

## 2010-08-29 NOTE — Op Note (Signed)
NAMEAMARII, Christina [name]                ACCOUNT NO.:  [id number]   MEDICAL RECORD NO.:  [id number]          PATIENT TYPE:  AMB   LOCATION:  DAY                          FACILITY:  [facility]   PHYSICIAN:  Lorre Munroe., M.D.DATE OF BIRTH:  1934/08/13   DATE OF PROCEDURE:  05/07/2004  DATE OF DISCHARGE:                                 OPERATIVE REPORT   PREOPERATIVE DIAGNOSIS:  Biliary dyskinesia, probable chronic cholecystitis.   POSTOPERATIVE DIAGNOSIS:  Biliary dyskinesia, probable chronic  cholecystitis.   OPERATION:  Laparoscopic cholecystectomy with operative cholangiogram.   SURGEON:  Lebron Conners, M.D.   ASSISTANT:  Anselm Pancoast. Zachery Dakins, M.D.   ANESTHESIA:  General.   PROCEDURE:  After the patient was monitored and anesthetized and had routine  preparation and draping of the abdomen, infiltrated with local anesthetic  just below the umbilicus and subsequently at three additional locations in  the right abdomen for placement of ports.  I made a short transverse  incision below the umbilicus dissecting down through the fascia in the  midline to the peritoneum, which I opened bluntly.  I then secured a Hasson  cannula with 0 Vicryl pursestring suture in the fascia and inflated the  abdomen with CO2.  There were adhesions of omentum to the undersurface of  the gallbladder and liver and otherwise the abdominal contents had normal  appearance.  After placement of the three additional ports under direct  vision and positioning of the patient head up/foot down and left tilted, I  dissected away the adhesions and grasped the fundus of the gallbladder  elevating it to the right shoulder.  I then dissected out the infundibulum  and pulled it laterally and clearly demonstrated the cystic duct emerging  from the infundibulum.  I clipped the cystic duct with a clip just as it  emerged and made a small nick in it, put in a cholangiogram catheter, and  then performed a fluoroscopic  cholangiogram showing normal biliary anatomy  with normal sized ducts, free flow into the duodenum, and no evidence of any  stones in the bile ducts.  I then reinserted the laparoscope, repositioned  the patient, and removed the cholangiogram catheter and clipped the distal  cystic duct with three clips and divided it.  I similarly clipped and  divided the cystic artery and then dissected the gallbladder from the liver  using cautery and gained hemostasis with the cautery.  I briefly irrigated  the operative area to remove bile and blood and saw that hemostasis was  good, that the clips were secure, and that there was no evident bile  leakage.  I then removed the gallbladder from the abdomen through the  umbilical incision and tied the pursestring  suture.  I removed the remaining irrigant out from the right upper quadrant  and then removed the lateral ports.  After allowing the CO2 to escape, I  removed the epigastric port.  I closed all skin incisions with  intracuticular 4-0 Vicryl and Steri-Strips and applied bandages.  She  tolerated the operation well.      WB/MEDQ  D:  05/07/2004  T:  05/07/2004  Job:  [id number]   cc:   Ellin Saba., M.D.  [address] [place]  [place] [address]  Fax: [phone]

## 2010-09-03 ENCOUNTER — Telehealth: Payer: Self-pay | Admitting: Cardiology

## 2010-09-03 NOTE — Telephone Encounter (Signed)
LOV,stress,12 faxed to [facility] @ [phone]  09/03/10/km

## 2010-10-07 ENCOUNTER — Ambulatory Visit (INDEPENDENT_AMBULATORY_CARE_PROVIDER_SITE_OTHER): Payer: Medicare Other | Admitting: Family Medicine

## 2010-10-07 ENCOUNTER — Encounter: Payer: Self-pay | Admitting: Family Medicine

## 2010-10-07 ENCOUNTER — Telehealth: Payer: Self-pay | Admitting: *Deleted

## 2010-10-07 ENCOUNTER — Other Ambulatory Visit (HOSPITAL_COMMUNITY)
Admission: RE | Admit: 2010-10-07 | Discharge: 2010-10-07 | Disposition: A | Discharge status: Home / Self Care | Payer: Medicare Other | Source: Ambulatory Visit | Attending: Family Medicine | Admitting: Family Medicine

## 2010-10-07 VITALS — BP 128/82 | HR 74 | Temp 98.9°F | Ht 63.5 in | Wt 130.0 lb

## 2010-10-07 DIAGNOSIS — T148XXA Other injury of unspecified body region, initial encounter: Secondary | ICD-10-CM

## 2010-10-07 DIAGNOSIS — E785 Hyperlipidemia, unspecified: Secondary | ICD-10-CM

## 2010-10-07 DIAGNOSIS — D649 Anemia, unspecified: Secondary | ICD-10-CM

## 2010-10-07 DIAGNOSIS — Z01419 Encounter for gynecological examination (general) (routine) without abnormal findings: Secondary | ICD-10-CM | POA: Insufficient documentation

## 2010-10-07 DIAGNOSIS — Z23 Encounter for immunization: Secondary | ICD-10-CM

## 2010-10-07 DIAGNOSIS — Z0000 Encounter for general adult medical examination without abnormal findings: Secondary | ICD-10-CM

## 2010-10-07 DIAGNOSIS — I10 Essential (primary) hypertension: Secondary | ICD-10-CM

## 2010-10-07 DIAGNOSIS — E039 Hypothyroidism, unspecified: Secondary | ICD-10-CM

## 2010-10-07 DIAGNOSIS — R3915 Urgency of urination: Secondary | ICD-10-CM

## 2010-10-07 DIAGNOSIS — I2510 Atherosclerotic heart disease of native coronary artery without angina pectoris: Secondary | ICD-10-CM

## 2010-10-07 DIAGNOSIS — IMO0002 Reserved for concepts with insufficient information to code with codable children: Secondary | ICD-10-CM

## 2010-10-07 DIAGNOSIS — M5126 Other intervertebral disc displacement, lumbar region: Secondary | ICD-10-CM

## 2010-10-07 DIAGNOSIS — E559 Vitamin D deficiency, unspecified: Secondary | ICD-10-CM

## 2010-10-07 LAB — POCT URINALYSIS DIPSTICK
Bilirubin, UA: NEGATIVE
Glucose, UA: NEGATIVE
Ketones, UA: NEGATIVE
Leukocytes, UA: NEGATIVE
Nitrite, UA: NEGATIVE

## 2010-10-07 LAB — CBC WITH DIFFERENTIAL/PLATELET
Basophils Absolute: 0 10*3/uL (ref 0.0–0.1)
Eosinophils Absolute: 0 10*3/uL (ref 0.0–0.7)
HCT: 42.6 % (ref 36.0–46.0)
Hemoglobin: 14.5 g/dL (ref 12.0–15.0)
Lymphocytes Relative: 27.4 % (ref 12.0–46.0)
Lymphs Abs: 2 10*3/uL (ref 0.7–4.0)
MCHC: 34 g/dL (ref 30.0–36.0)
Neutro Abs: 4.8 10*3/uL (ref 1.4–7.7)
Platelets: 246 10*3/uL (ref 150.0–400.0)
RDW: 12.9 % (ref 11.5–14.6)

## 2010-10-07 LAB — TSH: TSH: 1.03 u[IU]/mL (ref 0.35–5.50)

## 2010-10-07 LAB — BASIC METABOLIC PANEL
BUN: 10 mg/dL (ref 6–23)
CO2: 29 mEq/L (ref 19–32)
Calcium: 9.8 mg/dL (ref 8.4–10.5)
GFR: 122.39 mL/min (ref >60.00)
Glucose, Bld: 131 mg/dL — ABNORMAL HIGH (ref 70–99)
Sodium: 137 mEq/L (ref 135–145)

## 2010-10-07 LAB — LIPID PANEL: Cholesterol: 180 mg/dL (ref 0–200)

## 2010-10-07 LAB — HEPATIC FUNCTION PANEL
Bilirubin, Direct: 0.2 mg/dL (ref 0.0–0.3)
Total Bilirubin: 1 mg/dL (ref 0.3–1.2)

## 2010-10-07 MED ORDER — LEVOTHYROXINE SODIUM 50 MCG PO TABS: 50.0000 ug | ORAL_TABLET | Freq: Every day | ORAL | Status: DC

## 2010-10-07 MED ORDER — ATORVASTATIN CALCIUM 80 MG PO TABS: 80.0000 mg | ORAL_TABLET | Freq: Every day | ORAL | Status: DC

## 2010-10-07 MED ORDER — HYDROCODONE-ACETAMINOPHEN 10-650 MG PO TABS: 1.0000 | ORAL_TABLET | ORAL | Status: DC | PRN

## 2010-10-07 MED ORDER — LORAZEPAM 1 MG PO TABS: ORAL_TABLET | ORAL | Status: DC

## 2010-10-07 MED ORDER — DIAZEPAM 5 MG PO TABS: ORAL_TABLET | ORAL | Status: AC

## 2010-10-07 NOTE — Telephone Encounter (Signed)
rx sent in to pharmacy for correct qty.

## 2010-10-07 NOTE — Telephone Encounter (Signed)
Received rx for levothyroxine 50 mcg, only put a quantity of 1 tablet and 11 refills.  Need to verify the quantity to dispense.

## 2010-10-07 NOTE — Progress Notes (Signed)
  Subjective:    Patient ID: Christina [name], female    DOB: [birth year], 76 y.o.   MRN: [id number] This 76 year old divorced female presented to him as her lab studies and refill meds her medications. He had demonstrated lumbar herniated disc and was treated by Dr. Lisbeth Ply her with epidural injections and has been doing her well having no pain at this time on May 10 patient was washing windows and ruptured dorsal tendon on the right middle finger and his continuing to worry spell that she was treated by hand surgeon patient complained of constipation which is related with MiraLax and increase fiber diet. Immunizations are up-to-date except for Pneumovax which she will get today As been under the care of Dr. Olga Coaster wall has been doing fine all around having chest pain and her neck she continues to need diazepam for stress. Having some problem with insomnia and would like to try lorazepam at bedtime  No GU symptoms and needs a bone density of which she will get when she gets her next mammogram. The patient had been taken her Lipitor daily Allegra p.r.n. And Lorcet as needed for pain HPI    Review of Systemssee history of present illness     Objective:   Physical Exam the patient is well-developed well-nourished attractive pleasant white female who is in no distress HEENT no positive findings carotid pulses were good thyroid small Lungs clear to palpation percussion and auscultation no rales no wheezes no dullness Heart no evidence of cardiomegaly heart sounds are good without murmurs or rhythm peripheral pulses good  Breastno masses felt no tenderness nipples normal axilla clear no lymphadenopathy Abdomen liver spleen and kidneys are nonpalpable bowel sounds normal Pelvic external intraorbital vaginal mucosa normal cervix normal Pap smear done uterus normal and small Rectal examination no hemorrhoids present bimanual examination reveals adnexal areas negative no positive findings Extremities patient has  a brace on her right hand involving the right middle finger no other abnormalities noted Findings examination straight leg raising negative reflexes 1+ bilaterally Neurological examination negative Skin negative       Assessment & Plan:  Hypertension well controlled no change in medication Herniated lumbar disc doing fine following the epidural injection to take hydrocodone if needed for pain  Insomnia to try lorazepam 1-2 mg h.s.Hypothyroidism continue Synthroid Anxiety and stress to continue diazepam 5 mg t.i.d. P.r.n. Ruptured dorsal tendon Middle finger right hand 2 continue splint Hypothyroidism continue levothyroxine 50 mcg q.d. Hyperlipidemia continue Lipitor 80 mg q.d.

## 2010-10-07 NOTE — Patient Instructions (Signed)
I am vertically to tear doing so well specially your herniated lumbar disc which responded to the epidural injection Will try lorazepam one or 2 mg at bedtime for sleep We'll call results of labs Have refilled her medications

## 2010-10-08 LAB — VITAMIN D 25 HYDROXY (VIT D DEFICIENCY, FRACTURES): Vit D, 25-Hydroxy: 81 ng/mL (ref 30–89)

## 2010-10-13 ENCOUNTER — Telehealth: Payer: Self-pay

## 2010-10-13 NOTE — Telephone Encounter (Signed)
Called to give pt lab results; left a message for pt to return call  

## 2010-10-16 ENCOUNTER — Other Ambulatory Visit (INDEPENDENT_AMBULATORY_CARE_PROVIDER_SITE_OTHER): Payer: Medicare Other | Admitting: Family Medicine

## 2010-10-16 ENCOUNTER — Other Ambulatory Visit: Payer: Self-pay | Admitting: Family Medicine

## 2010-10-16 DIAGNOSIS — K921 Melena: Secondary | ICD-10-CM

## 2010-10-16 DIAGNOSIS — IMO0001 Reserved for inherently not codable concepts without codable children: Secondary | ICD-10-CM

## 2010-10-16 DIAGNOSIS — E119 Type 2 diabetes mellitus without complications: Secondary | ICD-10-CM

## 2010-10-16 LAB — HEMOCCULT GUIAC POC 1CARD (OFFICE)
Card #3 Fecal Occult Blood, POC: NEGATIVE
Fecal Occult Blood, POC: NEGATIVE

## 2010-10-20 ENCOUNTER — Other Ambulatory Visit (INDEPENDENT_AMBULATORY_CARE_PROVIDER_SITE_OTHER): Payer: Medicare Other

## 2010-10-20 DIAGNOSIS — E119 Type 2 diabetes mellitus without complications: Secondary | ICD-10-CM

## 2010-10-20 LAB — BASIC METABOLIC PANEL
BUN: 9 mg/dL (ref 6–23)
Chloride: 98 mEq/L (ref 96–112)
GFR: 107.88 mL/min (ref >60.00)
Glucose, Bld: 144 mg/dL — ABNORMAL HIGH (ref 70–99)
Potassium: 4.1 mEq/L (ref 3.5–5.1)
Sodium: 134 mEq/L — ABNORMAL LOW (ref 135–145)

## 2010-10-23 ENCOUNTER — Other Ambulatory Visit: Payer: Self-pay | Admitting: Family Medicine

## 2010-10-31 NOTE — Progress Notes (Signed)
Pt is aware.  

## 2010-11-05 ENCOUNTER — Encounter (HOSPITAL_BASED_OUTPATIENT_CLINIC_OR_DEPARTMENT_OTHER)
Admission: RE | Admit: 2010-11-05 | Discharge: 2010-11-05 | Disposition: A | Discharge status: Home / Self Care | Payer: Medicare Other | Source: Ambulatory Visit | Attending: Orthopedic Surgery | Admitting: Orthopedic Surgery

## 2010-11-05 ENCOUNTER — Ambulatory Visit
Admission: RE | Admit: 2010-11-05 | Discharge: 2010-11-05 | Disposition: A | Discharge status: Home / Self Care | Payer: Medicare Other | Source: Ambulatory Visit | Attending: Orthopedic Surgery | Admitting: Orthopedic Surgery

## 2010-11-05 ENCOUNTER — Other Ambulatory Visit: Payer: Self-pay | Admitting: Orthopedic Surgery

## 2010-11-05 DIAGNOSIS — Z01811 Encounter for preprocedural respiratory examination: Secondary | ICD-10-CM

## 2010-11-05 LAB — BASIC METABOLIC PANEL
BUN: 13 mg/dL (ref 6–23)
Chloride: 96 mEq/L (ref 96–112)
Glucose, Bld: 91 mg/dL (ref 70–99)
Potassium: 4.7 mEq/L (ref 3.5–5.1)

## 2010-11-11 ENCOUNTER — Ambulatory Visit (HOSPITAL_BASED_OUTPATIENT_CLINIC_OR_DEPARTMENT_OTHER)
Admission: RE | Admit: 2010-11-11 | Discharge: 2010-11-11 | Disposition: A | Discharge status: Home / Self Care | Payer: Medicare Other | Source: Ambulatory Visit | Attending: Orthopedic Surgery | Admitting: Orthopedic Surgery

## 2010-11-11 DIAGNOSIS — Y92009 Unspecified place in unspecified non-institutional (private) residence as the place of occurrence of the external cause: Secondary | ICD-10-CM | POA: Insufficient documentation

## 2010-11-11 DIAGNOSIS — Z01818 Encounter for other preprocedural examination: Secondary | ICD-10-CM | POA: Insufficient documentation

## 2010-11-11 DIAGNOSIS — X58XXXA Exposure to other specified factors, initial encounter: Secondary | ICD-10-CM | POA: Insufficient documentation

## 2010-11-11 DIAGNOSIS — S63269A Dislocation of metacarpophalangeal joint of unspecified finger, initial encounter: Secondary | ICD-10-CM | POA: Insufficient documentation

## 2010-11-11 DIAGNOSIS — Z79899 Other long term (current) drug therapy: Secondary | ICD-10-CM | POA: Insufficient documentation

## 2010-11-11 DIAGNOSIS — Z01812 Encounter for preprocedural laboratory examination: Secondary | ICD-10-CM | POA: Insufficient documentation

## 2010-11-11 DIAGNOSIS — S63659A Sprain of metacarpophalangeal joint of unspecified finger, initial encounter: Secondary | ICD-10-CM | POA: Insufficient documentation

## 2010-11-11 DIAGNOSIS — Y998 Other external cause status: Secondary | ICD-10-CM | POA: Insufficient documentation

## 2010-11-13 NOTE — Op Note (Signed)
Christina [name], [employer]                ACCOUNT NO.:  [id number]  MEDICAL RECORD NO.:  [id number]  LOCATION:                                 FACILITY:  PHYSICIAN:  Katy Fitch. Alfretta Pinch, M.D. DATE OF BIRTH:  1934/08/09  DATE OF PROCEDURE:  11/11/2010 DATE OF DISCHARGE:                              OPERATIVE REPORT   PREOPERATIVE DIAGNOSES: 1. Rupture of radial sagittal fibers with extensor subluxation right     long finger metacarpophalangeal joint. 2. Palmar subluxation and ulnar rotation of right long finger     metacarpophalangeal joint due to insufficiency of radial collateral     ligament.  POSTOPERATIVE DIAGNOSIS:  Confirmation of proximal origin rupture radial collateral ligament and rupture of radial sagittal fibers due to hand trauma on Aug 21, 2010.  OPERATION: 1. Reconstruction of right long finger radial collateral ligament with     cheilectomy and reconstruction of ligament with through bone suture     technique with 3-0 FiberWire. 2. Reconstruction of extensor hood at metacarpophalangeal joint     including free tendon graft, reconstruction of radial sagittal     fibers, release of ulnar intrinsic and relaxation of ulnar sagittal     fibers, relocating the extensor tendon to the dorsal midline of the     metacarpophalangeal joint.  SURGEON:  Katy Fitch. Lakeisa Heninger, MD  ASSISTANT:  Annye Rusk, PA-C  ANESTHESIA:  General by LMA.  SUPERVISED ANESTHESIOLOGIST:  Janetta Hora. 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  She had an extensor lag and subluxation of long finger metacarpophalangeal joint.  She was initially evaluated at the [facility] by Dr. Eulah Pont and his team and noted to have the aforementioned injury. She was then referred to see my associate, Dr. Dairl Ponder who noted the radial sagittal rupture.   [name] has background impairment of the radial collateral ligaments of the long and index fingers with tendency for baseline ulnar drift.  Dr. Mina Marble elected to treat the radial sagittal fiber injury with closed splinting.  [name] completed 9 weeks of closed splinting, but had persistent ulnar deviation of the finger and extensor lag and x-rays revealed subluxation of the metacarpophalangeal joint.  She requested an alternative hand surgery opinion.  I saw her for evaluation and noted the lax of the radial collateral ligament joint subluxation and the chronic position of the radial sagittal fiber insufficiency leading to contracture of the ulnar sagittal fibers and ulnar intrinsic.  She had 3+ intrinsic tightness of the ulnar intrinsics with Bunnell's testing.  We recommended that she proceed with realignment of her extensor and possible reconstruction of the radial collateral ligament.  Due to her chronic laxity of her index radial collateral ligament, we will have more challenges in the postoperative rehabilitation not being able to buddy-tape the adjacent index finger.  Given her age of 76, I did not feel that reconstruction of the index radial collateral ligament was indicated as she did not have complaints of pain or extensor subluxation in the index finger.  Preoperatively, she was advised of potential risks and benefits of surgery.  She understands that she will have stiffness of the metacarpophalangeal joint for many months following this reconstruction due to a period of rest while the tendon grafts heal.  Questions were invited and answered in detail.  PROCEDURE:  [name] was brought to room 2 of the [facility] and placed in supine position on the operating table.  Preoperatively, she was interviewed by Dr. Gelene Mink who provided detailed anesthesia informed consent.  Dr. Gelene Mink recommended general anesthesia by LMA technique.  Under Dr.  Thornton Dales direct supervision, general anesthesia by LMA technique was induced followed by routine scrub and paint of the right upper extremity.  Ancef 1 g  was administered as an IV prophylactic antibiotic.  Procedure commenced with exsanguination of the right arm with an Esmarch bandage and inflation of the arterial tourniquet to 220 mmHg.  A routine surgical time-out was accomplished during which time we noted her drug intolerances and other medical predicaments.  We proceeded with exposure of the extensor mechanism and metacarpophalangeal joint with a dorsal curvilinear incision.  Dorsal transverse veins were identified and electrocauterized.  There was noted to be a complete rupture of the radial sagittal fibers and chronic ulnar subluxation of the extensor digitorum longus.  The ulnar sagittal fibers were contracted and the ulnar intrinsic was quite tight.  We fully dissected all the scar off the remnants of the radial sagittal fibers, ulnar sagittal fibers, identified the ulnar intrinsic, and released the intrinsic tendon of the interosseous muscle with scissors dissection and simple release by tenotomy.  I then pie-crusted the ulnar sagittal fibers to increase the length followed by clinical realignment of the long finger extensor.  We elevated the remnants of the radial sagittal fibers, harvested a distally based tendon graft from the radial aspect of the extensor digitorum longus which measured approximately 3 mm in width leaving a remaining tendon 6 mm in width.  The capsule was entered and the origin of the radial collateral ligament inspected.  There was cavitary degeneration of the radial collateral ligament.  This was released with a 15 blade.  A rongeur was used to perform a cheilectomy of an osteophyte in this region, followed by use of a curette to create a bare spot at the insertion of the radial collateral ligament.  A 3-0 FiberWire was then used with grasping  suture technique to gather the proximal aspect of the radial collateral ligament.  Drill holes were created through bone with a 0.035-inch Kirschner wire followed by passage of the suture tails with a micro Mayo needle.  The joint was reduced and slightly radially rotated as the radial collateral ligament repair was tied.  Correction of the subluxation joint was achieved.  The dorsal capsule of the MP joint was then repaired with multiple figure-of-eight sutures of 3-0 Ethibond.  The free tendon graft was then woven through the sagittal fibers palmar to the level of the rupture.  I chose not to pass it beneath the intermetacarpal ligament.  This was woven through the extensor tendon and back upon itself with a second pass through the radial sagittal fibers palmar aspect.  An excellent reconstruction of radial sagittal fibers was achieved.  The tendon ends were then buried with the tendon braider followed by use of multiple mattress sutures of 3-0 Ethibond to secure the tendon transfer.  Very satisfactory alignment of the extensor was achieved centralized over the dorsal aspect of the MP joint.  The radial  sagittal fibers were then repaired with a pants-over-vest technique, reinforcing the repair with multiple mattress sutures of 3-0 Ethibond.  The wound was then irrigated, the skin repaired with intradermal 4-0 Prolene and Steri-Strips.  Ms. Lechtenberg was placed in compressive dressing with the wrist immobilized in 30 degrees of dorsiflexion and the MP joint radially deviated at 20 degrees of flexion, PIP and DIP motion will be allowed.  For aftercare, she is advised to elevate her hand for the next 48 hours. She is brought prescriptions for Dilaudid 2 mg one p.o. q. 4-6 h. p.r.n. pain 30 tablets without refill, also Keflex 500 mg one p.o. q. 8 h x4 days as a prophylactic antibiotic.  We will see her back in followup in our office in 1 week or sooner p.r.n. problems.     Katy Fitch Cree Napoli, M.D.     RVS/MEDQ  D:  11/11/2010  T:  11/11/2010  Job:  [id number]  cc:   Loraine Leriche A. Perini, M.D. Ellin Saba., MD [address], M.D.  Electronically Signed by Josephine Igo M.D. on 11/13/2010 09:59:52 AM

## 2010-12-02 ENCOUNTER — Other Ambulatory Visit: Payer: Self-pay | Admitting: Family Medicine

## 2011-01-02 LAB — BASIC METABOLIC PANEL
CO2: 27
CO2: 29
Calcium: 9.3
Chloride: 102
Chloride: 98
Glucose, Bld: 89
Glucose, Bld: 98
Potassium: 3.8
Potassium: 3.8
Sodium: 133 — ABNORMAL LOW
Sodium: 137

## 2011-01-02 LAB — CBC
HCT: 38.9
HCT: 40.8
HCT: 43
Hemoglobin: 13.3
Hemoglobin: 13.8
Hemoglobin: 14.6
MCHC: 34
MCHC: 34
MCV: 94.2
RDW: 12.4
RDW: 12.6
RDW: 12.8

## 2011-01-02 LAB — DIFFERENTIAL
Basophils Absolute: 0
Eosinophils Relative: 4
Lymphocytes Relative: 27
Monocytes Absolute: 0.5

## 2011-01-02 LAB — HEPARIN LEVEL (UNFRACTIONATED): Heparin Unfractionated: 1.36 — ABNORMAL HIGH

## 2011-01-13 ENCOUNTER — Other Ambulatory Visit: Payer: Self-pay | Admitting: Physician Assistant

## 2011-01-23 ENCOUNTER — Ambulatory Visit (INDEPENDENT_AMBULATORY_CARE_PROVIDER_SITE_OTHER): Payer: Medicare Other | Admitting: Family Medicine

## 2011-01-23 ENCOUNTER — Encounter: Payer: Self-pay | Admitting: Family Medicine

## 2011-01-23 ENCOUNTER — Telehealth: Payer: Self-pay | Admitting: *Deleted

## 2011-01-23 ENCOUNTER — Ambulatory Visit (INDEPENDENT_AMBULATORY_CARE_PROVIDER_SITE_OTHER)
Admission: RE | Admit: 2011-01-23 | Discharge: 2011-01-23 | Disposition: A | Discharge status: Home / Self Care | Payer: Medicare Other | Source: Ambulatory Visit | Attending: Family Medicine | Admitting: Family Medicine

## 2011-01-23 VITALS — BP 140/78 | Temp 98.8°F | Wt 131.0 lb

## 2011-01-23 DIAGNOSIS — S81009A Unspecified open wound, unspecified knee, initial encounter: Secondary | ICD-10-CM

## 2011-01-23 DIAGNOSIS — S91009A Unspecified open wound, unspecified ankle, initial encounter: Secondary | ICD-10-CM

## 2011-01-23 NOTE — Progress Notes (Signed)
  Subjective:    Patient ID: Christina [name], female    DOB: 08-Feb-1935, 76 y.o.   MRN: [id number]  HPI Left knee wound. Fell 4 weeks ago. Landed on concrete. Deep abrasion left lateral anterior knee. Slow to heal. No purulent drainage. Cleaning with soap and water. Using Bactroban topically.  Tetanus 2008. No history of diabetes.  Some pain with ambulation.   Review of Systems  Constitutional: Negative for fever and chills.  Musculoskeletal: Negative for gait problem.  Skin: Positive for wound.       Objective:   Physical Exam  Constitutional: She appears well-developed and well-nourished.  Cardiovascular: Normal rate and regular rhythm.   Pulmonary/Chest: Effort normal and breath sounds normal. No respiratory distress. She has no wheezes. She has no rales.  Skin:       Left knee reveals 10 by 15 mm wound left lateral knee. No purulent drainage. No surrounding erythema. Significant tenderness to palpation over proximal fibula          Assessment & Plan:  Left knee wound. Slow to heal possibly partly related to location. Obtain x-ray. DuoDERM dressing and instructions to change every 4 days. Reassess here in 10 days and sooner as needed

## 2011-01-23 NOTE — Telephone Encounter (Signed)
VM requesting X-ray report result.  Neg knee x-ray, no fx, pt informed on VM

## 2011-01-23 NOTE — Patient Instructions (Signed)
In 4 days remove duoderm and clean wound with soap and water and let air dry, then reapply duoderm. Repeat cycle every 4 days.

## 2011-02-03 ENCOUNTER — Encounter: Payer: Self-pay | Admitting: Family Medicine

## 2011-02-03 ENCOUNTER — Ambulatory Visit (INDEPENDENT_AMBULATORY_CARE_PROVIDER_SITE_OTHER): Payer: Medicare Other | Admitting: Family Medicine

## 2011-02-03 VITALS — BP 150/92 | Temp 99.1°F | Wt 133.0 lb

## 2011-02-03 DIAGNOSIS — R0781 Pleurodynia: Secondary | ICD-10-CM

## 2011-02-03 DIAGNOSIS — S81009A Unspecified open wound, unspecified knee, initial encounter: Secondary | ICD-10-CM

## 2011-02-03 DIAGNOSIS — R079 Chest pain, unspecified: Secondary | ICD-10-CM

## 2011-02-03 DIAGNOSIS — S81809A Unspecified open wound, unspecified lower leg, initial encounter: Secondary | ICD-10-CM

## 2011-02-03 NOTE — Patient Instructions (Signed)
Be in touch if rib pain not improving over the next couple of weeks.

## 2011-02-03 NOTE — Progress Notes (Signed)
  Subjective:    Patient ID: Christina [name], female    DOB: [birth year], 76 y.o.   MRN: [id number]  HPI  Patient seen in followup left knee wound. Poorly healing for over one month. We started DuoDERM and she's had great response. Wound almost fully healed in little over a week.  New problem of left side pain. Lower rib cage pain anterior cage. No injury. Denies cough or dyspnea. No pleuritic pain. No rash. Hydrocodone with mild relief. Denies abdominal pain.  Pain is "sore" quality and moderate severity.   Review of Systems  Constitutional: Negative for fever and chills.  Respiratory: Negative for cough, shortness of breath and wheezing.   Cardiovascular: Negative for chest pain and leg swelling.  Gastrointestinal: Negative for abdominal pain.  Hematological: Negative for adenopathy.       Objective:   Physical Exam  Constitutional: She appears well-developed and well-nourished.  Cardiovascular: Normal rate and regular rhythm.   Pulmonary/Chest: Effort normal and breath sounds normal. No respiratory distress. She has no wheezes. She has no rales.  Musculoskeletal:       Left knee examined. Small eschar knee which is healed dramatically since last visit. No cellulitis changes.  Minimally tender L lower rib cage around 10th rib mid axillary line but poorly localized.  Skin: No rash noted.          Assessment & Plan:  #1 left knee wound which has healed significantly. No need for any further colloid dressing at this time  #2 left rib cage pain. Suspect musculoskeletal. Watch closely for any rash (eg shingles). Continue supplemental hydrocodone as needed touch base 2 weeks if no better.

## 2011-03-31 ENCOUNTER — Other Ambulatory Visit: Payer: Self-pay | Admitting: Family Medicine

## 2011-04-02 NOTE — Telephone Encounter (Signed)
Call in #[phone] with no rf 

## 2011-04-02 NOTE — Telephone Encounter (Signed)
Script called in

## 2011-04-14 DIAGNOSIS — Z9889 Other specified postprocedural states: Secondary | ICD-10-CM

## 2011-04-14 HISTORY — DX: Other specified postprocedural states: Z98.890

## 2011-04-18 ENCOUNTER — Other Ambulatory Visit: Payer: Self-pay | Admitting: Family Medicine

## 2011-04-20 ENCOUNTER — Other Ambulatory Visit: Payer: Self-pay | Admitting: Family Medicine

## 2011-04-20 NOTE — Telephone Encounter (Signed)
Per Dr. Fabian Sharp- ok #30 only

## 2011-04-21 NOTE — Telephone Encounter (Signed)
rx called in

## 2011-05-20 ENCOUNTER — Encounter: Payer: Self-pay | Admitting: Internal Medicine

## 2011-05-20 ENCOUNTER — Ambulatory Visit (INDEPENDENT_AMBULATORY_CARE_PROVIDER_SITE_OTHER): Payer: Medicare Other | Admitting: Internal Medicine

## 2011-05-20 VITALS — BP 120/80 | HR 78 | Ht 63.0 in | Wt 133.0 lb

## 2011-05-20 DIAGNOSIS — M47816 Spondylosis without myelopathy or radiculopathy, lumbar region: Secondary | ICD-10-CM

## 2011-05-20 DIAGNOSIS — M545 Low back pain, unspecified: Secondary | ICD-10-CM

## 2011-05-20 DIAGNOSIS — Z8739 Personal history of other diseases of the musculoskeletal system and connective tissue: Secondary | ICD-10-CM

## 2011-05-20 DIAGNOSIS — I10 Essential (primary) hypertension: Secondary | ICD-10-CM

## 2011-05-20 DIAGNOSIS — G4700 Insomnia, unspecified: Secondary | ICD-10-CM

## 2011-05-20 DIAGNOSIS — E785 Hyperlipidemia, unspecified: Secondary | ICD-10-CM

## 2011-05-20 DIAGNOSIS — E039 Hypothyroidism, unspecified: Secondary | ICD-10-CM

## 2011-05-20 DIAGNOSIS — Z79891 Long term (current) use of opiate analgesic: Secondary | ICD-10-CM

## 2011-05-20 DIAGNOSIS — M47817 Spondylosis without myelopathy or radiculopathy, lumbosacral region: Secondary | ICD-10-CM

## 2011-05-20 DIAGNOSIS — R7309 Other abnormal glucose: Secondary | ICD-10-CM

## 2011-05-20 DIAGNOSIS — R739 Hyperglycemia, unspecified: Secondary | ICD-10-CM

## 2011-05-20 DIAGNOSIS — I2510 Atherosclerotic heart disease of native coronary artery without angina pectoris: Secondary | ICD-10-CM

## 2011-05-20 DIAGNOSIS — Z90710 Acquired absence of both cervix and uterus: Secondary | ICD-10-CM

## 2011-05-20 DIAGNOSIS — Z79899 Other long term (current) drug therapy: Secondary | ICD-10-CM

## 2011-05-20 MED ORDER — LORAZEPAM 1 MG PO TABS: 1.0000 mg | ORAL_TABLET | Freq: Every evening | ORAL | Status: DC | PRN

## 2011-05-20 MED ORDER — HYDROCODONE-ACETAMINOPHEN 10-650 MG PO TABS: 1.0000 | ORAL_TABLET | ORAL | Status: AC

## 2011-05-20 NOTE — Patient Instructions (Addendum)
Continue  Exercise as tolerated .  The pain could be from your back and ok to use  Heat of ice on the area. Caution  With    Pain meds .   As we discussed ty 1/2 if  Needed. Minimize narcotic use if possible .  Also with the  Lorazepam  For sleep.   This is not recommended for  geriatric patients and can cause mental fogginess falling and is depend int producing . Do not drive or take with alcohol.  Check up  In summer 4-6 months

## 2011-05-20 NOTE — Progress Notes (Signed)
Subjective:    Patient ID: Christina [name], female    DOB: Jun 13, 1934, 77 y.o.   MRN: [id number]  HPI Pt comesin to establish . Pt of Dr Scotty Court. Who has since retired she has a number of medical issues that are followed by her primary doctor or cardiologist. Sugars:   High nomral.  Sis diabetes. Although there was diabetes listed on her problem list she has not have this diagnosis. Dietary lifestyle monitoring Allergies  : Seasonal takes Allegra as needed HT :  Followed by Dr. Scotty Court [facility]   [facility] and controlled   LIPIDS  lipitor per dr Daleen Squibb.  Cad ;  Dr. Stann Mainland give her nitroglycerin but she does not need this as is not having chest pain. Pain : CHornic narcotic use ? As high and shoulder surgery dislocations DJD of the spine with injections and cortisone shots. She has been taking 10 mg of hydrocodone Lortab 650 at night for while to help her sleep with her pain. She has not had surgery on her spine. She tries to stay active swimming walking and hiking. Most recently she had some hiking and was doing some yard work has some pain on her left buttocks not radiating down she calls this a hip area. She was on Voltaren in the past no obvious side effects has used it some recently. She is seeing Dr. Danielle Dess. In the past for a Ruptured disc. Nonsurgical problem Insomnia : Ongoing problem possibly aggravated by her joint pains. On lorazepam for sleep  for at least 6 months takes one or 2 of the 1 mg hard to sleep without it sometimes awakens after about 4 hours. 1 alcoholic beverage a night at dinnertime THyroid on thyroid replacement no problem  She has a history of polyps in the colon followed by Dr. Dickie La Over worked self  Ans injured self and had disclocate   Eulah Pont did surgery  Review of Systems Negative for weight loss weight gain major changes in vision or hearing chest pain shortness of breath that is new. They she hasn't had exertional chest pain recently. cough leading joint pains except as above  unusual skin rashes or nodules numbness or weakness rest as per history of present illness      Objective:   Physical Exam Physical Exam: Vital signs reviewed [id number] is a well-developed well-nourished alert cooperative  white female who appears her stated age  Or younger in no acute distress.  HEENT: normocephalic atraumatic , Eyes: PERRL EOM's full, conjunctiva clear, Nares: paten,t no deformity discharge or tenderness., Ears: no deformity EAC's clear TMs with normal landmarks. Mouth: clear OP, no lesions, edema.  Moist mucous membranes. Dentition in adequate repair. NECK: supple without masses, thyromegaly or bruits. CHEST/PULM:  Clear to auscultation and percussion breath sounds equal no wheeze , rales or rhonchi.  CV: PMI is nondisplaced, S1 S2 no gallops, murmurs, rubs. Peripheral pulses are full without delay.No JVD .  ABDOMEN: Bowel sounds normal nontender  No guard or rebound, no hepato splenomegal no CVA tenderness.   Extremtities:  No clubbing cyanosis or edema, no acute joint swelling or redness no focal atrophy she does have N. TP joint changes no acute synovitis gait pretty normal but points to the left proximal area as area of pain NEURO:  Oriented x3, cranial nerves 3-12 appear to be intact, no obvious focal weakness,gait within normal limits no abnormal reflexes or asymmetrical SKIN: No acute rashes normal turgor, color, no bruising or petechiae. PSYCH: Oriented, good eye contact, no  obvious depression anxiety, cognition and judgment appear normal. LN: no cervical  adenopathy   Record review data sheet reviewed        Assessment & Plan:  Transfer patient multiple medical conditions overall is actually doing quite well functioning well trying lifestyle activity to help her health. However she is on 2 dependent producing medications that could cause CNS side effects we discussed the limitations and risk benefit. At this point in time she'll try to limit to what is actually  needed aware of side effects consider  trying other options for the sleep. She may be getting rebound issues. Regard to her pain her not many choices because an anti-inflammatory may be more risk than a narcotic but we did discuss that. She will continue her physical activity and monitor.  Ht  controlled Lipids controlled Thyroid on replacement Pain ? djd  Chronic narcotic  Use  But seems to be function ok although has had some falls ( trip while hiking  Uneven ground) Benzo  Use also disc as above.    Disc comfort level with chronic use and safety.  Pt aware.  At fu plan contract sign or controlled subs . Unsure if there is one in ehr.  Total visit > 50% spent counseling and coordinating care      I.

## 2011-05-24 ENCOUNTER — Encounter: Payer: Self-pay | Admitting: Internal Medicine

## 2011-05-24 DIAGNOSIS — Z79891 Long term (current) use of opiate analgesic: Secondary | ICD-10-CM | POA: Insufficient documentation

## 2011-05-24 DIAGNOSIS — M47816 Spondylosis without myelopathy or radiculopathy, lumbar region: Secondary | ICD-10-CM | POA: Insufficient documentation

## 2011-05-24 DIAGNOSIS — Z90710 Acquired absence of both cervix and uterus: Secondary | ICD-10-CM | POA: Insufficient documentation

## 2011-05-24 DIAGNOSIS — Z8739 Personal history of other diseases of the musculoskeletal system and connective tissue: Secondary | ICD-10-CM | POA: Insufficient documentation

## 2011-05-24 DIAGNOSIS — R739 Hyperglycemia, unspecified: Secondary | ICD-10-CM | POA: Insufficient documentation

## 2011-06-02 ENCOUNTER — Other Ambulatory Visit: Payer: Self-pay | Admitting: Internal Medicine

## 2011-06-02 DIAGNOSIS — Z1231 Encounter for screening mammogram for malignant neoplasm of breast: Secondary | ICD-10-CM

## 2011-06-12 ENCOUNTER — Ambulatory Visit
Admission: RE | Admit: 2011-06-12 | Discharge: 2011-06-12 | Disposition: A | Discharge status: Home / Self Care | Payer: Medicare Other | Source: Ambulatory Visit | Attending: Internal Medicine | Admitting: Internal Medicine

## 2011-06-12 DIAGNOSIS — Z1231 Encounter for screening mammogram for malignant neoplasm of breast: Secondary | ICD-10-CM

## 2011-06-20 ENCOUNTER — Other Ambulatory Visit: Payer: Self-pay | Admitting: Family Medicine

## 2011-06-22 ENCOUNTER — Other Ambulatory Visit: Payer: Self-pay | Admitting: Family Medicine

## 2011-06-23 ENCOUNTER — Other Ambulatory Visit: Payer: Self-pay | Admitting: Family Medicine

## 2011-06-30 ENCOUNTER — Other Ambulatory Visit: Payer: Self-pay | Admitting: Family Medicine

## 2011-06-30 NOTE — Telephone Encounter (Signed)
Called patient about this we are trying to not use this medication.

## 2011-07-01 NOTE — Telephone Encounter (Signed)
Spoke to pt and she is not going to use this medication.

## 2011-07-22 ENCOUNTER — Telehealth: Payer: Self-pay | Admitting: Internal Medicine

## 2011-07-22 MED ORDER — LEVOTHYROXINE SODIUM 50 MCG PO TABS: 50.0000 ug | ORAL_TABLET | Freq: Every day | ORAL | Status: DC

## 2011-07-22 NOTE — Telephone Encounter (Signed)
Pharmacy called and said that Levoxyl is on manufacturer back order with no release date. Which med does doctor want to switch to?

## 2011-07-22 NOTE — Telephone Encounter (Signed)
Synthroid s

## 2011-07-22 NOTE — Telephone Encounter (Signed)
Rx sent to pharmacy   

## 2011-07-22 NOTE — Telephone Encounter (Signed)
Pls advise.  

## 2011-08-13 ENCOUNTER — Other Ambulatory Visit: Payer: Self-pay | Admitting: Internal Medicine

## 2011-08-14 NOTE — Telephone Encounter (Signed)
Pt last seen 05/20/11.  Rx last filled 05/20/11 #[id number] rf.  Pls advise.

## 2011-08-14 NOTE — Telephone Encounter (Signed)
Ok x 1

## 2011-08-19 ENCOUNTER — Ambulatory Visit (INDEPENDENT_AMBULATORY_CARE_PROVIDER_SITE_OTHER): Payer: Medicare Other | Admitting: Cardiology

## 2011-08-19 ENCOUNTER — Encounter: Payer: Self-pay | Admitting: Cardiology

## 2011-08-19 VITALS — BP 126/82 | HR 66 | Ht 63.0 in | Wt 128.0 lb

## 2011-08-19 DIAGNOSIS — I2510 Atherosclerotic heart disease of native coronary artery without angina pectoris: Secondary | ICD-10-CM

## 2011-08-19 DIAGNOSIS — I10 Essential (primary) hypertension: Secondary | ICD-10-CM

## 2011-08-19 DIAGNOSIS — E785 Hyperlipidemia, unspecified: Secondary | ICD-10-CM

## 2011-08-19 DIAGNOSIS — R079 Chest pain, unspecified: Secondary | ICD-10-CM

## 2011-08-19 MED ORDER — LORAZEPAM 1 MG PO TABS: 1.0000 mg | ORAL_TABLET | Freq: Every day | ORAL | Status: DC

## 2011-08-19 NOTE — Assessment & Plan Note (Signed)
I have advised her to stay on her current medications including her statin. I think the likelihood of her developing diabetes at her age and with her relatively normal body weight is very unlikely. After some discussion, she decided to stay on it.
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
## 2011-08-19 NOTE — Patient Instructions (Signed)
Your physician recommends that you continue on your current medications as directed. Please refer to the Current Medication list given to you today.  Your physician wants you to follow-up in: 1 year. You will receive a reminder letter in the mail two months in advance. If you don't receive a letter, please call our office to schedule the follow-up appointment.  

## 2011-09-17 ENCOUNTER — Other Ambulatory Visit: Payer: Self-pay | Admitting: Orthopedic Surgery

## 2011-09-17 ENCOUNTER — Other Ambulatory Visit: Payer: Self-pay

## 2011-09-17 DIAGNOSIS — M4310 Spondylolisthesis, site unspecified: Secondary | ICD-10-CM

## 2011-09-17 NOTE — Telephone Encounter (Signed)
Ok per Dr. Fabian Sharp to switch from levoxyl 50 mcg to levothyroxine 50 mcg.  Per Dr. Fabian Sharp advised pt that a TSH is needed in 3 to 6 weeks.  Called and spoke with pt and pt is aware of Dr. Rosezella Florida recommendations.  Called pharmacy and spoke with [facility] and he is aware that pt's rx can be changed.

## 2011-09-23 ENCOUNTER — Telehealth: Payer: Self-pay | Admitting: Internal Medicine

## 2011-09-23 ENCOUNTER — Ambulatory Visit
Admission: RE | Admit: 2011-09-23 | Discharge: 2011-09-23 | Disposition: A | Discharge status: Home / Self Care | Payer: Medicare Other | Source: Ambulatory Visit | Attending: Orthopedic Surgery | Admitting: Orthopedic Surgery

## 2011-09-23 DIAGNOSIS — M4310 Spondylolisthesis, site unspecified: Secondary | ICD-10-CM

## 2011-09-23 NOTE — Telephone Encounter (Signed)
Patient called stating that she need a referral to Dr. Daleen Bo, Neurosurgeon in [place] [phone] for her back issues. Please advise.

## 2011-09-23 NOTE — Telephone Encounter (Signed)
Ok to refer   Dx back pain  And send copies of any old back imaging studies that are in the EHR.

## 2011-09-24 NOTE — Telephone Encounter (Signed)
Called and spoke to receptionist.  Faxed last 2 CT scans.  The physician will look at the findings and will advise his nurse of an appt.  She will then contact me.  I have confirmation that the fax was received.  Waiting on response.

## 2011-10-07 ENCOUNTER — Telehealth: Payer: Self-pay | Admitting: Cardiology

## 2011-10-07 NOTE — Telephone Encounter (Signed)
New msg Karin Golden on [place] has some questions about rx for lorazepam Please call

## 2011-10-07 NOTE — Telephone Encounter (Signed)
Questions answered Mylo Red RN

## 2011-10-20 ENCOUNTER — Ambulatory Visit (INDEPENDENT_AMBULATORY_CARE_PROVIDER_SITE_OTHER): Payer: Medicare Other | Admitting: Internal Medicine

## 2011-10-20 ENCOUNTER — Encounter: Payer: Self-pay | Admitting: Internal Medicine

## 2011-10-20 VITALS — BP 160/84 | HR 65 | Temp 99.0°F | Wt 127.0 lb

## 2011-10-20 DIAGNOSIS — E785 Hyperlipidemia, unspecified: Secondary | ICD-10-CM

## 2011-10-20 DIAGNOSIS — E039 Hypothyroidism, unspecified: Secondary | ICD-10-CM

## 2011-10-20 DIAGNOSIS — R7309 Other abnormal glucose: Secondary | ICD-10-CM

## 2011-10-20 DIAGNOSIS — I10 Essential (primary) hypertension: Secondary | ICD-10-CM

## 2011-10-20 DIAGNOSIS — M48061 Spinal stenosis, lumbar region without neurogenic claudication: Secondary | ICD-10-CM | POA: Insufficient documentation

## 2011-10-20 DIAGNOSIS — M47817 Spondylosis without myelopathy or radiculopathy, lumbosacral region: Secondary | ICD-10-CM

## 2011-10-20 DIAGNOSIS — M47816 Spondylosis without myelopathy or radiculopathy, lumbar region: Secondary | ICD-10-CM

## 2011-10-20 DIAGNOSIS — R739 Hyperglycemia, unspecified: Secondary | ICD-10-CM

## 2011-10-20 DIAGNOSIS — Z79899 Other long term (current) drug therapy: Secondary | ICD-10-CM

## 2011-10-20 LAB — BASIC METABOLIC PANEL
CO2: 29 mEq/L (ref 19–32)
Chloride: 92 mEq/L — ABNORMAL LOW (ref 96–112)
Creatinine, Ser: 0.6 mg/dL (ref 0.4–1.2)
Potassium: 4.4 mEq/L (ref 3.5–5.1)
Sodium: 131 mEq/L — ABNORMAL LOW (ref 135–145)

## 2011-10-20 LAB — LIPID PANEL: Triglycerides: 161 mg/dL — ABNORMAL HIGH (ref 0.0–149.0)

## 2011-10-20 LAB — CBC WITH DIFFERENTIAL/PLATELET
Basophils Relative: 0.2 % (ref 0.0–3.0)
Eosinophils Relative: 0.3 % (ref 0.0–5.0)
HCT: 45.2 % (ref 36.0–46.0)
Hemoglobin: 14.9 g/dL (ref 12.0–15.0)
MCHC: 32.9 g/dL (ref 30.0–36.0)
MCV: 96.6 fl (ref 78.0–100.0)
Monocytes Absolute: 0.9 10*3/uL (ref 0.1–1.0)
Neutro Abs: 11.1 10*3/uL — ABNORMAL HIGH (ref 1.4–7.7)
Neutrophils Relative %: 71.6 % (ref 43.0–77.0)
RBC: 4.68 Mil/uL (ref 3.87–5.11)
WBC: 15.5 10*3/uL — ABNORMAL HIGH (ref 4.5–10.5)

## 2011-10-20 LAB — HEPATIC FUNCTION PANEL
ALT: 19 U/L (ref 0–35)
Albumin: 4.7 g/dL (ref 3.5–5.2)
Bilirubin, Direct: 0 mg/dL (ref 0.0–0.3)
Total Protein: 7.9 g/dL (ref 6.0–8.3)

## 2011-10-20 LAB — LDL CHOLESTEROL, DIRECT: Direct LDL: 106.5 mg/dL

## 2011-10-20 LAB — HEMOGLOBIN A1C: Hgb A1c MFr Bld: 6.9 % — ABNORMAL HIGH (ref 4.6–6.5)

## 2011-10-20 LAB — TSH: TSH: 0.83 u[IU]/mL (ref 0.35–5.50)

## 2011-10-20 MED ORDER — BISOPROLOL-HYDROCHLOROTHIAZIDE 5-6.25 MG PO TABS: 1.0000 | ORAL_TABLET | Freq: Every day | ORAL | Status: DC

## 2011-10-20 NOTE — Progress Notes (Signed)
  Subjective:    Patient ID: Christina [name], female    DOB: 1934-04-22, 77 y.o.   MRN: [id number]  HPI Patient comes in today for follow up of  multiple medical problems.  Since her last visit she has seen another neurosurgeon Dr. Tyrone Sage in [facility] This is for her L4-L5 spondylolisthesis and spinal stenosis. They are planning to have surgery July 22 for a left-sided decompression without hardware which the patient did not want to do. She has ongoing pain in her back and left leg taking worse at night and occasionally in the day and lorazepam 1 mg at night to help with sleep. She was also given soma by the neurosurgeon. She is currently finishing a Medrol Dosepak.  [place] [facility] [place] [facility]  [place] [facility] , blood pressure there today was 157/80. She's checked her readings before dinner usually in the 140 range but was elevated at the neurosurgeon office. Is taking Ziac issues done for while and does well with this does not want to change her medication to a different kind. She is on thyroid medication in due for a check. Lipids On medication evaluated by Dr. Daleen Squibb due  for lab tests.   Review of Systems No chest pain shortness of breath constitutional symptoms. Has a bruise on her right hand from accidentally hitting something but no limitation of motion or numbness some osteoarthritis. No significant GI GU symptoms today. She lives by herself but her son will be coming down to help with postop care. Past history family history social history reviewed in the electronic medical record.    Objective:   Physical Exam BP 178/96  Pulse 65  Temp 99 F (37.2 C) (Oral)  Wt 127 lb (57.607 kg)  SpO2 99% Well-developed well-nourished in no acute distress somewhat anxious about upcoming surgery. Gait is stable. Repeat blood pressure right arm 158/90 left arm 160/84  pulse is appear to be equal. Chest:  Clear to A&P without wheezes rales or rhonchi CV:  S1-S2 no gallops or murmurs  peripheral perfusion is normal Neck: Supple without adenopathy or masses or bruits extr some oa changes no acute findings  Fading bruises dorsum right hand  1 cm nodule  Non tender. No weakness hand recorded review   Assessment & Plan:  Lumbar stenosis with left l leg symptoms and mild spondylosis listhesis to have left sided l3-S1 laminectomy surgery  Hypertension elevated blood pressure readings today.  Patient states it's usually in the 140/80 range. As done well on current type of medication in prefers not to change the type of medicine she is on.  Prescription given for elevated dose Ziac 5/6.25 have her check her readings at home and if in the 150 and above range change to the higher dose to take one a day.  Sleep anxiety pain  okay to continue the lorazepam at night for now call for refills.  Hypothyroid lab tests today monitoring.  Hyperlipidemia Lab check tod for monitoring. Contusion right hand should heal insignificant higher risk because she was on aspirin and has aging skin.

## 2011-10-20 NOTE — Patient Instructions (Addendum)
Will notify you  of labs when available. Your blood pressure is somewhat high  . Would increase medication slightly if 150 and over .would increase to ziac 5/6.25  Daily  And monitor.

## 2011-10-23 ENCOUNTER — Other Ambulatory Visit: Payer: Self-pay | Admitting: Family Medicine

## 2011-10-23 DIAGNOSIS — I10 Essential (primary) hypertension: Secondary | ICD-10-CM

## 2011-10-23 DIAGNOSIS — D649 Anemia, unspecified: Secondary | ICD-10-CM

## 2011-10-23 DIAGNOSIS — E785 Hyperlipidemia, unspecified: Secondary | ICD-10-CM

## 2011-10-23 DIAGNOSIS — E039 Hypothyroidism, unspecified: Secondary | ICD-10-CM

## 2011-10-23 DIAGNOSIS — R739 Hyperglycemia, unspecified: Secondary | ICD-10-CM

## 2011-11-12 ENCOUNTER — Telehealth: Payer: Self-pay | Admitting: Internal Medicine

## 2011-11-12 NOTE — Telephone Encounter (Signed)
Caller: Christina [name]/Patient; Phone Number: [phone]; Message from caller: She would like to start taking Motrin 800 mg for her back pain and try to cut back on Hydrocodone.  She has taken it and it seems to help.  She has 1 or 2 left from a Rx she had for it but the refill has expired She uses Therapist, occupational at [employer] [facility] [address]

## 2011-11-13 NOTE — Telephone Encounter (Signed)
has she tried the naproxyn  ie aleve/ ? It is on her med list.  There is more risk of bleeding on the asa with ibu and naproxyn if taking every day .  Poss fluid retention  Bp med interference. So use as minimal as possible.  Ok if taking ocassionaly .  She needs to understand this risk also .   If ibu works better than naproxyn can rx small amt of ibuprofen 40  600 mg  1 po every 8 - 12 hours if needed for back pain . No refills.

## 2011-11-13 NOTE — Telephone Encounter (Signed)
Patient will try Aleve and will call back if needed.

## 2011-12-30 ENCOUNTER — Other Ambulatory Visit: Payer: Self-pay | Admitting: Internal Medicine

## 2011-12-30 MED ORDER — ATORVASTATIN CALCIUM 80 MG PO TABS: 80.0000 mg | ORAL_TABLET | Freq: Every day | ORAL | Status: DC

## 2012-01-22 ENCOUNTER — Other Ambulatory Visit: Payer: Self-pay | Admitting: *Deleted

## 2012-01-22 MED ORDER — LORAZEPAM 1 MG PO TABS: 1.0000 mg | ORAL_TABLET | Freq: Every day | ORAL | Status: DC

## 2012-01-25 ENCOUNTER — Encounter: Payer: Self-pay | Admitting: Internal Medicine

## 2012-02-12 ENCOUNTER — Other Ambulatory Visit (INDEPENDENT_AMBULATORY_CARE_PROVIDER_SITE_OTHER): Payer: Medicare Other

## 2012-02-12 DIAGNOSIS — I10 Essential (primary) hypertension: Secondary | ICD-10-CM

## 2012-02-12 DIAGNOSIS — E039 Hypothyroidism, unspecified: Secondary | ICD-10-CM

## 2012-02-12 DIAGNOSIS — E785 Hyperlipidemia, unspecified: Secondary | ICD-10-CM

## 2012-02-12 DIAGNOSIS — R7309 Other abnormal glucose: Secondary | ICD-10-CM

## 2012-02-12 DIAGNOSIS — D649 Anemia, unspecified: Secondary | ICD-10-CM

## 2012-02-12 DIAGNOSIS — R739 Hyperglycemia, unspecified: Secondary | ICD-10-CM

## 2012-02-12 LAB — POCT URINALYSIS DIPSTICK
Bilirubin, UA: NEGATIVE
Ketones, UA: NEGATIVE
Leukocytes, UA: NEGATIVE
Nitrite, UA: NEGATIVE
Protein, UA: NEGATIVE

## 2012-02-12 LAB — CBC WITH DIFFERENTIAL/PLATELET
Basophils Absolute: 0 10*3/uL (ref 0.0–0.1)
HCT: 41.2 % (ref 36.0–46.0)
Lymphs Abs: 2.2 10*3/uL (ref 0.7–4.0)
MCV: 93.3 fl (ref 78.0–100.0)
Monocytes Absolute: 0.6 10*3/uL (ref 0.1–1.0)
Platelets: 218 10*3/uL (ref 150.0–400.0)
RDW: 12.6 % (ref 11.5–14.6)

## 2012-02-12 LAB — HEMOGLOBIN A1C: Hgb A1c MFr Bld: 6.4 % (ref 4.6–6.5)

## 2012-02-12 LAB — HEPATIC FUNCTION PANEL
AST: 26 U/L (ref 0–37)
Alkaline Phosphatase: 85 U/L (ref 39–117)
Bilirubin, Direct: 0.1 mg/dL (ref 0.0–0.3)
Total Bilirubin: 0.6 mg/dL (ref 0.3–1.2)

## 2012-02-12 LAB — BASIC METABOLIC PANEL
GFR: 103.38 mL/min (ref >60.00)
Potassium: 4.1 mEq/L (ref 3.5–5.1)
Sodium: 134 mEq/L — ABNORMAL LOW (ref 135–145)

## 2012-02-12 LAB — LIPID PANEL
Total CHOL/HDL Ratio: 3
VLDL: 18.6 mg/dL (ref 0.0–40.0)

## 2012-02-20 ENCOUNTER — Encounter: Payer: Self-pay | Admitting: Family Medicine

## 2012-02-20 ENCOUNTER — Ambulatory Visit (INDEPENDENT_AMBULATORY_CARE_PROVIDER_SITE_OTHER): Payer: Medicare Other | Admitting: Family Medicine

## 2012-02-20 VITALS — BP 144/82 | HR 58 | Temp 97.5°F | Wt 130.0 lb

## 2012-02-20 DIAGNOSIS — N390 Urinary tract infection, site not specified: Secondary | ICD-10-CM

## 2012-02-20 DIAGNOSIS — R319 Hematuria, unspecified: Secondary | ICD-10-CM

## 2012-02-20 LAB — POCT URINALYSIS DIPSTICK
Nitrite, UA: NEGATIVE
Protein, UA: NEGATIVE
Urobilinogen, UA: 0.2
pH, UA: 5

## 2012-02-20 MED ORDER — LORAZEPAM 1 MG PO TABS: 1.0000 mg | ORAL_TABLET | Freq: Every day | ORAL | Status: DC

## 2012-02-20 MED ORDER — CEPHALEXIN 500 MG PO CAPS: 500.0000 mg | ORAL_CAPSULE | Freq: Two times a day (BID) | ORAL | Status: AC

## 2012-02-20 NOTE — Progress Notes (Signed)
  Subjective:    Patient ID: Christina [name], female    DOB: [birth year], 77 y.o.   MRN: [id number]  HPI UTI- pt developed dysuria yesterday, later in the day noted blood in urine.  + frequency.  + urgency.  + hesitancy.  No fevers.  + back pain.   Review of Systems For ROS see HPI     Objective:   Physical Exam  Vitals reviewed. Constitutional: She appears well-developed and well-nourished. No distress.  Abdominal: Soft. She exhibits no distension. There is tenderness (mild suprapubic tenderness but no CVA tenderness ).          Assessment & Plan:

## 2012-02-20 NOTE — Patient Instructions (Addendum)
This appears to be a bladder infection Start the Keflex twice daily- take w/ food Drink plenty of fluids Call with any questions or concerns Have a great trip!!!

## 2012-02-20 NOTE — Assessment & Plan Note (Signed)
Pt's sxs and UA consistent w/ infxn.  Start abx.  Reviewed supportive care and red flags that should prompt return.  Pt expressed understanding and is in agreement w/ plan.  

## 2012-02-23 ENCOUNTER — Ambulatory Visit: Payer: Medicare Other | Admitting: Internal Medicine

## 2012-02-26 ENCOUNTER — Telehealth: Payer: Self-pay | Admitting: Internal Medicine

## 2012-02-26 NOTE — Telephone Encounter (Signed)
Left message for the pt to return my call. 

## 2012-02-26 NOTE — Telephone Encounter (Signed)
If pt calls back to after hours service  And has no fever   Chills system symptoms  And just dysuria or simple uti sx And not allergic to sulfa meds can   Send in or RX.   Septra ds( generic)  1 po bid   For 3 days  Disp 6    If still not better needs ROV.

## 2012-02-26 NOTE — Telephone Encounter (Signed)
Pt was seen by Dr Beverely Low  And given keflex for presumed uti please advise what her sx are now to  decide on further action  ? Fever blood pain etc .

## 2012-02-26 NOTE — Telephone Encounter (Signed)
Pt states she was seen last week for uti and given an antibiotic.  She is not better and is asking for a stronger antibiotic.  Pt is currently out of town and will not return until late tonight.   Ph[facility]: [facility] [address]

## 2012-02-27 ENCOUNTER — Telehealth: Payer: Self-pay | Admitting: Internal Medicine

## 2012-02-27 MED ORDER — SULFAMETHOXAZOLE-TRIMETHOPRIM 800-160 MG PO TABS: 1.0000 | ORAL_TABLET | Freq: Two times a day (BID) | ORAL | Status: DC

## 2012-02-27 NOTE — Telephone Encounter (Signed)
Patient states that she was seen last Saturday with kidney infection and blood in urine. She had been taking antibiotics and was feeling better but last night started experiencing the same symptoms again. I offered patient to come in this morning to the [facility] but she wanted to know if something else could be called in to [facility] on [address]

## 2012-02-27 NOTE — Telephone Encounter (Signed)
Pt has no fever. As per Dr. Rosezella Florida note from 02/26/2012 I sent in Bactrim for Pt. Pt will f/u w/ PCP if no better in 3-4 days.

## 2012-02-29 NOTE — Telephone Encounter (Signed)
Spoke to the pt.  She called the Clinic over the weekend and a new antibiotic was called for her.  She will call back if this is not helping.

## 2012-03-15 ENCOUNTER — Encounter: Payer: Self-pay | Admitting: Internal Medicine

## 2012-03-28 ENCOUNTER — Telehealth: Payer: Self-pay | Admitting: *Deleted

## 2012-03-28 MED ORDER — NITROGLYCERIN 0.4 MG SL SUBL: 0.4000 mg | SUBLINGUAL_TABLET | SUBLINGUAL | Status: AC | PRN

## 2012-03-28 NOTE — Telephone Encounter (Signed)
Pt called and asked for a refill on Nitro .04 sub tablets.  Rx is not in med list, but Dr. Anola Gurney notes from 2010-2011 in [facility] stated she takes Nitro as needed.  Please advise if she can have a Rx sent in for this medication.  Caralee Ates, CMA

## 2012-03-28 NOTE — Telephone Encounter (Signed)
Yes, per May 2012 office note, pt can have SL Nitroglycerin.

## 2012-04-22 ENCOUNTER — Telehealth: Payer: Self-pay | Admitting: Internal Medicine

## 2012-04-22 ENCOUNTER — Other Ambulatory Visit: Payer: Self-pay | Admitting: Internal Medicine

## 2012-04-22 MED ORDER — LEVOTHYROXINE SODIUM 50 MCG PO TABS: 50.0000 ug | ORAL_TABLET | Freq: Every day | ORAL | Status: DC

## 2012-04-22 NOTE — Telephone Encounter (Signed)
Levothyroxine sent to the pharmacy by e-scribe.  Received first request today.

## 2012-04-22 NOTE — Telephone Encounter (Signed)
Pt requesting rx refill of levoxyl .05mg  sent to [facility].  Pt states she contacted pharmacy 3 days ago and they were suppose to have faxed request over.  Pt will run of medication today.

## 2012-05-02 ENCOUNTER — Encounter: Payer: Self-pay | Admitting: Internal Medicine

## 2012-05-02 ENCOUNTER — Ambulatory Visit (INDEPENDENT_AMBULATORY_CARE_PROVIDER_SITE_OTHER): Payer: Medicare Other | Admitting: Internal Medicine

## 2012-05-02 ENCOUNTER — Other Ambulatory Visit: Payer: Self-pay | Admitting: Internal Medicine

## 2012-05-02 VITALS — BP 138/82 | HR 60 | Temp 98.2°F | Ht 63.0 in | Wt 130.0 lb

## 2012-05-02 DIAGNOSIS — M5410 Radiculopathy, site unspecified: Secondary | ICD-10-CM

## 2012-05-02 DIAGNOSIS — I10 Essential (primary) hypertension: Secondary | ICD-10-CM

## 2012-05-02 DIAGNOSIS — G4700 Insomnia, unspecified: Secondary | ICD-10-CM

## 2012-05-02 DIAGNOSIS — IMO0002 Reserved for concepts with insufficient information to code with codable children: Secondary | ICD-10-CM

## 2012-05-02 DIAGNOSIS — Z79899 Other long term (current) drug therapy: Secondary | ICD-10-CM

## 2012-05-02 DIAGNOSIS — G629 Polyneuropathy, unspecified: Secondary | ICD-10-CM

## 2012-05-02 DIAGNOSIS — Z79891 Long term (current) use of opiate analgesic: Secondary | ICD-10-CM

## 2012-05-02 DIAGNOSIS — Z0000 Encounter for general adult medical examination without abnormal findings: Secondary | ICD-10-CM

## 2012-05-02 DIAGNOSIS — M47817 Spondylosis without myelopathy or radiculopathy, lumbosacral region: Secondary | ICD-10-CM

## 2012-05-02 DIAGNOSIS — E538 Deficiency of other specified B group vitamins: Secondary | ICD-10-CM

## 2012-05-02 DIAGNOSIS — I2510 Atherosclerotic heart disease of native coronary artery without angina pectoris: Secondary | ICD-10-CM

## 2012-05-02 DIAGNOSIS — M48061 Spinal stenosis, lumbar region without neurogenic claudication: Secondary | ICD-10-CM

## 2012-05-02 DIAGNOSIS — M47816 Spondylosis without myelopathy or radiculopathy, lumbar region: Secondary | ICD-10-CM

## 2012-05-02 DIAGNOSIS — G589 Mononeuropathy, unspecified: Secondary | ICD-10-CM

## 2012-05-02 DIAGNOSIS — E785 Hyperlipidemia, unspecified: Secondary | ICD-10-CM

## 2012-05-02 DIAGNOSIS — E039 Hypothyroidism, unspecified: Secondary | ICD-10-CM

## 2012-05-02 LAB — IBC PANEL: Saturation Ratios: 39.2 % (ref 20.0–50.0)

## 2012-05-02 LAB — HEPATIC FUNCTION PANEL
Albumin: 4.3 g/dL (ref 3.5–5.2)
Bilirubin, Direct: 0 mg/dL (ref 0.0–0.3)
Total Protein: 7.4 g/dL (ref 6.0–8.3)

## 2012-05-02 MED ORDER — PREGABALIN 50 MG PO CAPS: 50.0000 mg | ORAL_CAPSULE | Freq: Three times a day (TID) | ORAL | Status: DC

## 2012-05-02 MED ORDER — LORAZEPAM 1 MG PO TABS: 1.0000 mg | ORAL_TABLET | Freq: Every day | ORAL | Status: DC

## 2012-05-02 MED ORDER — HYDROCODONE-ACETAMINOPHEN 10-650 MG PO TABS: ORAL_TABLET | ORAL | Status: DC

## 2012-05-02 NOTE — Patient Instructions (Signed)
Try samples of Lyrica take 50 mg at night or up to 3 times a day.  If not helping can increase to 75 mg 2-3 times a day. He can make you drowsy when you're getting use to it but can help with nerve pain.  If needed we may try adding back Cymbalta as this has been helpful in some people with joint pain nerve pain and depression. However would try the Lyrica first.   We'll check your B12 level to make sure that is not adding on to your nerve pain.  I would like you to come back in about a month to see if we need to try a different approach.  Please contact cardiology if you're getting increasing recurrent chest pain. Although it doesn't really sound like heart pain at this time.
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
## 2012-05-03 LAB — GLIADIN ANTIBODIES, SERUM
Gliadin IgA: 1.7 U/mL (ref <20)
Gliadin IgG: 3.3 U/mL (ref <20)

## 2012-05-06 ENCOUNTER — Ambulatory Visit (INDEPENDENT_AMBULATORY_CARE_PROVIDER_SITE_OTHER): Payer: Medicare Other | Admitting: Internal Medicine

## 2012-05-06 ENCOUNTER — Encounter: Payer: Self-pay | Admitting: Internal Medicine

## 2012-05-06 VITALS — BP 135/84 | HR 73 | Temp 98.1°F | Wt 132.0 lb

## 2012-05-06 DIAGNOSIS — J22 Unspecified acute lower respiratory infection: Secondary | ICD-10-CM

## 2012-05-06 DIAGNOSIS — J988 Other specified respiratory disorders: Secondary | ICD-10-CM

## 2012-05-06 DIAGNOSIS — G589 Mononeuropathy, unspecified: Secondary | ICD-10-CM

## 2012-05-06 DIAGNOSIS — R6889 Other general symptoms and signs: Secondary | ICD-10-CM

## 2012-05-06 DIAGNOSIS — J111 Influenza due to unidentified influenza virus with other respiratory manifestations: Secondary | ICD-10-CM

## 2012-05-06 DIAGNOSIS — G629 Polyneuropathy, unspecified: Secondary | ICD-10-CM

## 2012-05-06 DIAGNOSIS — M5410 Radiculopathy, site unspecified: Secondary | ICD-10-CM | POA: Insufficient documentation

## 2012-05-06 LAB — IMMUNOFIXATION ELECTROPHORESIS
IgA: 205 mg/dL (ref 69–380)
IgM, Serum: 111 mg/dL (ref 52–322)

## 2012-05-06 MED ORDER — OSELTAMIVIR PHOSPHATE 75 MG PO CAPS: 75.0000 mg | ORAL_CAPSULE | Freq: Two times a day (BID) | ORAL | Status: DC

## 2012-05-06 NOTE — Patient Instructions (Signed)
This acts like  Flu  Even though had vaccine . Antibiotics wont help at this time. Chest is clear and no era infection. Bed rest and fluids  For now. If get sig fever consider taking tamiflu   But works best  If taken in the first  72 hours .     Hold off on the  lyrica for now until better and retry later.   Upper Respiratory Infection, Adult An upper respiratory infection (URI) is also sometimes known as the common cold. The upper respiratory tract includes the nose, sinuses, throat, trachea, and bronchi. Bronchi are the airways leading to the lungs. Most people improve within 1 week, but symptoms can last up to 2 weeks. A residual cough may last even longer.  CAUSES Many different viruses can infect the tissues lining the upper respiratory tract. The tissues become irritated and inflamed and often become very moist. Mucus production is also common. A cold is contagious. You can easily spread the virus to others by oral contact. This includes kissing, sharing a glass, coughing, or sneezing. Touching your mouth or nose and then touching a surface, which is then touched by another person, can also spread the virus. SYMPTOMS  Symptoms typically develop 1 to 3 days after you come in contact with a cold virus. Symptoms vary from person to person. They may include:  Runny nose.  Sneezing.  Nasal congestion.  Sinus irritation.  Sore throat.  Loss of voice (laryngitis).  Cough.  Fatigue.  Muscle aches.  Loss of appetite.  Headache.  Low-grade fever. DIAGNOSIS  You might diagnose your own cold based on familiar symptoms, since most people get a cold 2 to 3 times a year. Your caregiver can confirm this based on your exam. Most importantly, your caregiver can check that your symptoms are not due to another disease such as strep throat, sinusitis, pneumonia, asthma, or epiglottitis. Blood tests, throat tests, and X-rays are not necessary to diagnose a common cold, but they may sometimes be  helpful in excluding other more serious diseases. Your caregiver will decide if any further tests are required. RISKS AND COMPLICATIONS  You may be at risk for a more severe case of the common cold if you smoke cigarettes, have chronic heart disease (such as heart failure) or lung disease (such as asthma), or if you have a weakened immune system. The very young and very old are also at risk for more serious infections. Bacterial sinusitis, middle ear infections, and bacterial pneumonia can complicate the common cold. The common cold can worsen asthma and chronic obstructive pulmonary disease (COPD). Sometimes, these complications can require emergency medical care and may be life-threatening. PREVENTION  The best way to protect against getting a cold is to practice good hygiene. Avoid oral or hand contact with people with cold symptoms. Wash your hands often if contact occurs. There is no clear evidence that vitamin C, vitamin E, echinacea, or exercise reduces the chance of developing a cold. However, it is always recommended to get plenty of rest and practice good nutrition. TREATMENT  Treatment is directed at relieving symptoms. There is no cure. Antibiotics are not effective, because the infection is caused by a virus, not by bacteria. Treatment may include:  Increased fluid intake. Sports drinks offer valuable electrolytes, sugars, and fluids.  Breathing heated mist or steam (vaporizer or shower).  Eating chicken soup or other clear broths, and maintaining good nutrition.  Getting plenty of rest.  Using gargles or lozenges for comfort.  Controlling fevers with ibuprofen or acetaminophen as directed by your caregiver.  Increasing usage of your inhaler if you have asthma. Zinc gel and zinc lozenges, taken in the first 24 hours of the common cold, can shorten the duration and lessen the severity of symptoms. Pain medicines may help with fever, muscle aches, and throat pain. A variety of  non-prescription medicines are available to treat congestion and runny nose. Your caregiver can make recommendations and may suggest nasal or lung inhalers for other symptoms.  HOME CARE INSTRUCTIONS   Only take over-the-counter or prescription medicines for pain, discomfort, or fever as directed by your caregiver.  Use a warm mist humidifier or inhale steam from a shower to increase air moisture. This may keep secretions moist and make it easier to breathe.  Drink enough water and fluids to keep your urine clear or pale yellow.  Rest as needed.  Return to work when your temperature has returned to normal or as your caregiver advises. You may need to stay home longer to avoid infecting others. You can also use a face mask and careful hand washing to prevent spread of the virus. SEEK MEDICAL CARE IF:   After the first few days, you feel you are getting worse rather than better.  You need your caregiver's advice about medicines to control symptoms.  You develop chills, worsening shortness of breath, or brown or red sputum. These may be signs of pneumonia.  You develop yellow or brown nasal discharge or pain in the face, especially when you bend forward. These may be signs of sinusitis.  You develop a fever, swollen neck glands, pain with swallowing, or white areas in the back of your throat. These may be signs of strep throat. SEEK IMMEDIATE MEDICAL CARE IF:   You have a fever.  You develop severe or persistent headache, ear pain, sinus pain, or chest pain.  You develop wheezing, a prolonged cough, cough up blood, or have a change in your usual mucus (if you have chronic lung disease).  You develop sore muscles or a stiff neck. Document Released: 09/23/2000 Document Revised: 06/22/2011 Document Reviewed: 08/01/2010 [facility] Patient Information 2013 [place], [place]. Influenza Facts Flu (influenza) is a contagious respiratory illness caused by the influenza viruses. It can cause mild  to severe illness. While most healthy people recover from the flu without specific treatment and without complications, older people, young children, and people with certain health conditions are at higher risk for serious complications from the flu, including death. CAUSES   The flu virus is spread from person to person by respiratory droplets from coughing and sneezing.  A person can also become infected by touching an object or surface with a virus on it and then touching their mouth, eye or nose.  Adults may be able to infect others from 1 day before symptoms occur and up to 7 days after getting sick. So it is possible to give someone the flu even before you know you are sick and continue to infect others while you are sick. SYMPTOMS   Fever (usually high).  Headache.  Tiredness (can be extreme).  Cough.  Sore throat.  Runny or stuffy nose.  Body aches.  Diarrhea and vomiting may also occur, particularly in children.  These symptoms are referred to as "flu-like symptoms". A lot of different illnesses, including the common cold, can have similar symptoms. DIAGNOSIS   There are tests that can determine if you have the flu as long you are tested within the  first 2 or 3 days of illness.  A doctor's exam and additional tests may be needed to identify if you have a disease that is a complicating the flu. RISKS AND COMPLICATIONS  Some of the complications caused by the flu include:  Bacterial pneumonia or progressive pneumonia caused by the flu virus.  Loss of body fluids (dehydration).  Worsening of chronic medical conditions, such as heart failure, asthma, or diabetes.  Sinus problems and ear infections. HOME CARE INSTRUCTIONS   Seek medical care early on.  If you are at high risk from complications of the flu, consult your health-care provider as soon as you develop flu-like symptoms. Those at high risk for complications include:  People 65 years or older.  People  with chronic medical conditions, including diabetes.  Pregnant women.  Young children.  Your caregiver may recommend use of an antiviral medication to help treat the flu.  If you get the flu, get plenty of rest, drink a lot of liquids, and avoid using alcohol and tobacco.  You can take over-the-counter medications to relieve the symptoms of the flu if your caregiver approves. (Never give aspirin to children or teenagers who have flu-like symptoms, particularly fever). PREVENTION  The single best way to prevent the flu is to get a flu vaccine each fall. Other measures that can help protect against the flu are:  Antiviral Medications  A number of antiviral drugs are approved for use in preventing the flu. These are prescription medications, and a doctor should be consulted before they are used.  Habits for Good Health  Cover your nose and mouth with a tissue when you cough or sneeze, throw the tissue away after you use it.  Wash your hands often with soap and water, especially after you cough or sneeze. If you are not near water, use an alcohol-based hand cleaner.  Avoid people who are sick.  If you get the flu, stay home from work or school. Avoid contact with other people so that you do not make them sick, too.  Try not to touch your eyes, nose, or mouth as germs ore often spread this way. IN CHILDREN, EMERGENCY WARNING SIGNS THAT NEED URGENT MEDICAL ATTENTION:  Fast breathing or trouble breathing.  Bluish skin color.  Not drinking enough fluids.  Not waking up or not interacting.  Being so irritable that the child does not want to be held.  Flu-like symptoms improve but then return with fever and worse cough.  Fever with a rash. IN ADULTS, EMERGENCY WARNING SIGNS THAT NEED URGENT MEDICAL ATTENTION:  Difficulty breathing or shortness of breath.  Pain or pressure in the chest or abdomen.  Sudden dizziness.  Confusion.  Severe or persistent vomiting. SEEK  IMMEDIATE MEDICAL CARE IF:  You or someone you know is experiencing any of the symptoms above. When you arrive at the emergency center,report that you think you have the flu. You may be asked to wear a mask and/or sit in a secluded area to protect others from getting sick. MAKE SURE YOU:   Understand these instructions.  Monitor your condition.  Seek medical care if you are getting worse, or not improving. Document Released: 04/02/2003 Document Revised: 06/22/2011 Document Reviewed: 12/27/2008 [facility] Patient Information 2013 [place], [place].
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
## 2012-05-07 ENCOUNTER — Encounter: Payer: Self-pay | Admitting: Internal Medicine

## 2012-05-07 DIAGNOSIS — H269 Unspecified cataract: Secondary | ICD-10-CM | POA: Insufficient documentation

## 2012-05-30 ENCOUNTER — Telehealth: Payer: Self-pay | Admitting: Internal Medicine

## 2012-05-30 NOTE — Telephone Encounter (Signed)
Pt would like samples of lyrica. Pt has appt on 06-02-12

## 2012-05-30 NOTE — Telephone Encounter (Signed)
Patient notified by telephone to pick up samples at the front desk.

## 2012-05-30 NOTE — Telephone Encounter (Signed)
Ok to   Samples of 50 mg 21 a piece

## 2012-06-02 ENCOUNTER — Ambulatory Visit: Payer: Medicare Other | Admitting: Internal Medicine

## 2012-06-14 ENCOUNTER — Encounter: Payer: Medicare Other | Admitting: Internal Medicine

## 2012-06-14 NOTE — Progress Notes (Signed)
Opened  For review pt ? cx

## 2012-06-16 ENCOUNTER — Other Ambulatory Visit: Payer: Self-pay

## 2012-06-16 DIAGNOSIS — Z1231 Encounter for screening mammogram for malignant neoplasm of breast: Secondary | ICD-10-CM

## 2012-07-12 ENCOUNTER — Ambulatory Visit
Admission: RE | Admit: 2012-07-12 | Discharge: 2012-07-12 | Disposition: A | Discharge status: Home / Self Care | Payer: Medicare Other | Source: Ambulatory Visit

## 2012-07-12 DIAGNOSIS — Z1231 Encounter for screening mammogram for malignant neoplasm of breast: Secondary | ICD-10-CM

## 2012-09-29 ENCOUNTER — Telehealth: Payer: Self-pay | Admitting: Internal Medicine

## 2012-09-29 NOTE — Telephone Encounter (Signed)
Pt is requesting samples of lyrica 75 mg if no samples available please call into [facility]

## 2012-09-30 NOTE — Telephone Encounter (Signed)
Last seen on 05/02/12 and given samples on that day.  #21.  Please advise.  Thanks!

## 2012-09-30 NOTE — Telephone Encounter (Signed)
Needs follow up appt   But can rx lyrica 75 mg take 1 po bid  Disp 60  Until she can get in for appt  ( dont know if insurance will cover this med or not )

## 2012-10-04 NOTE — Telephone Encounter (Signed)
Tried reaching the patient by telephone.  No answering machine available.  Will try again at a later time.

## 2012-10-05 ENCOUNTER — Other Ambulatory Visit: Payer: Self-pay | Admitting: Family Medicine

## 2012-10-05 ENCOUNTER — Telehealth: Payer: Self-pay | Admitting: Internal Medicine

## 2012-10-05 MED ORDER — PREGABALIN 75 MG PO CAPS: 75.0000 mg | ORAL_CAPSULE | Freq: Two times a day (BID) | ORAL | Status: DC

## 2012-10-05 NOTE — Telephone Encounter (Signed)
Left message for pt to call back  °

## 2012-10-05 NOTE — Telephone Encounter (Signed)
Pt notified she will need appt.  Will call when she returns from vacation.  One month supply sent to [facility] on [address].

## 2012-10-05 NOTE — Telephone Encounter (Signed)
Left message at home number for the pt to return my call. 

## 2012-10-05 NOTE — Telephone Encounter (Signed)
Spoke with pt and let her know that there were no polyps removed during her last colonoscopy.

## 2012-10-11 ENCOUNTER — Telehealth: Payer: Self-pay | Admitting: Family Medicine

## 2012-10-11 NOTE — Telephone Encounter (Signed)
Received a fax from [facility] on [address].  Need to change patient's hydrocodone.  Currently prescribed 10/650.  This is no longer made.  Please advise.  Thanks!!

## 2012-10-12 NOTE — Telephone Encounter (Addendum)
havent seen this patient since January . No longer  prescribing this medication.  She should get narcotic pain med from her back specialist  In [place].   Or we can refer her to  A pain clinic.

## 2012-10-13 NOTE — Telephone Encounter (Signed)
Left a message for pt to return call 

## 2012-10-17 NOTE — Telephone Encounter (Signed)
Left message on home phone for the pt to return my call. 

## 2012-10-18 NOTE — Telephone Encounter (Signed)
Left message on home and cell [phone]) for the pt to return my call.  Have tried several times and am not able to reach the patient. Also sent a message to the pharmacy by fax on 10/17/12 informing them to send the medication request to her back specialist.

## 2012-10-22 ENCOUNTER — Other Ambulatory Visit: Payer: Self-pay | Admitting: Internal Medicine

## 2012-10-28 ENCOUNTER — Encounter: Payer: Self-pay | Admitting: Internal Medicine

## 2012-10-28 ENCOUNTER — Ambulatory Visit (INDEPENDENT_AMBULATORY_CARE_PROVIDER_SITE_OTHER): Payer: Medicare Other | Admitting: Internal Medicine

## 2012-10-28 VITALS — BP 126/82 | Temp 98.4°F | Wt 133.0 lb

## 2012-10-28 DIAGNOSIS — M48061 Spinal stenosis, lumbar region without neurogenic claudication: Secondary | ICD-10-CM

## 2012-10-28 DIAGNOSIS — IMO0002 Reserved for concepts with insufficient information to code with codable children: Secondary | ICD-10-CM

## 2012-10-28 DIAGNOSIS — S81812A Laceration without foreign body, left lower leg, initial encounter: Secondary | ICD-10-CM

## 2012-10-28 DIAGNOSIS — S81009A Unspecified open wound, unspecified knee, initial encounter: Secondary | ICD-10-CM

## 2012-10-28 DIAGNOSIS — S81819A Laceration without foreign body, unspecified lower leg, initial encounter: Secondary | ICD-10-CM | POA: Insufficient documentation

## 2012-10-28 DIAGNOSIS — S81809A Unspecified open wound, unspecified lower leg, initial encounter: Secondary | ICD-10-CM

## 2012-10-28 DIAGNOSIS — M5410 Radiculopathy, site unspecified: Secondary | ICD-10-CM
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
## 2012-10-28 NOTE — Patient Instructions (Signed)
Change the duoderm in 4 days  Keep clean gentle  cleaning ( not peroxide  At this time  )   Fu if signs of infection or  When you get back if needed

## 2012-11-23 ENCOUNTER — Other Ambulatory Visit: Payer: Self-pay | Admitting: Internal Medicine

## 2012-11-25 NOTE — Telephone Encounter (Signed)
Last filled on 05/02/12 #30 with 5 additional refills Last seen on 10/28/12 Has no future appt Please advise Thanks!!

## 2012-11-28 ENCOUNTER — Telehealth: Payer: Self-pay | Admitting: Family Medicine

## 2012-11-28 NOTE — Telephone Encounter (Signed)
Last seen on 05/02/12 #30 with 5 additional refills Seen acutely on 10/28/12 No future appt Please avise. Thanks!

## 2012-11-30 ENCOUNTER — Other Ambulatory Visit: Payer: Self-pay | Admitting: Family Medicine

## 2012-11-30 ENCOUNTER — Telehealth: Payer: Self-pay | Admitting: Family Medicine

## 2012-11-30 MED ORDER — LORAZEPAM 1 MG PO TABS: 1.0000 mg | ORAL_TABLET | Freq: Every day | ORAL | Status: DC

## 2012-11-30 NOTE — Telephone Encounter (Signed)
Last filled on 05/02/12 #30 with 5 additional refills Last seen on 10/28/12 No follow up scheduled. Please advise.  Thanks!!

## 2012-11-30 NOTE — Telephone Encounter (Signed)
Called to the pharmacy and left on voicemail. 

## 2012-11-30 NOTE — Telephone Encounter (Signed)
Ok to refill 30  X 5

## 2012-12-01 ENCOUNTER — Other Ambulatory Visit: Payer: Self-pay | Admitting: Neurological Surgery

## 2012-12-01 DIAGNOSIS — M545 Low back pain, unspecified: Secondary | ICD-10-CM

## 2012-12-14 ENCOUNTER — Other Ambulatory Visit: Payer: Self-pay | Admitting: Internal Medicine

## 2012-12-15 ENCOUNTER — Ambulatory Visit
Admission: RE | Admit: 2012-12-15 | Discharge: 2012-12-15 | Disposition: A | Discharge status: Home / Self Care | Payer: Medicare Other | Source: Ambulatory Visit | Attending: Neurological Surgery | Admitting: Neurological Surgery

## 2012-12-15 DIAGNOSIS — M545 Low back pain, unspecified: Secondary | ICD-10-CM

## 2012-12-15 MED ORDER — GADOBENATE DIMEGLUMINE 529 MG/ML IV SOLN
13.0000 mL | Freq: Once | INTRAVENOUS | Status: AC | PRN
  Administered 2012-12-15: 13 mL via INTRAVENOUS

## 2012-12-20 DIAGNOSIS — F419 Anxiety disorder, unspecified: Secondary | ICD-10-CM | POA: Insufficient documentation

## 2012-12-26 ENCOUNTER — Other Ambulatory Visit: Payer: Self-pay | Admitting: Internal Medicine

## 2012-12-26 DIAGNOSIS — N63 Unspecified lump in unspecified breast: Secondary | ICD-10-CM

## 2013-01-09 ENCOUNTER — Ambulatory Visit
Admission: RE | Admit: 2013-01-09 | Discharge: 2013-01-09 | Disposition: A | Discharge status: Home / Self Care | Payer: Medicare Other | Source: Ambulatory Visit | Attending: Internal Medicine | Admitting: Internal Medicine

## 2013-01-09 DIAGNOSIS — N63 Unspecified lump in unspecified breast: Secondary | ICD-10-CM

## 2013-02-16 ENCOUNTER — Other Ambulatory Visit: Payer: Self-pay

## 2013-03-01 ENCOUNTER — Encounter: Payer: Self-pay | Admitting: Internal Medicine

## 2013-03-01 ENCOUNTER — Ambulatory Visit (INDEPENDENT_AMBULATORY_CARE_PROVIDER_SITE_OTHER): Payer: Medicare Other | Admitting: Internal Medicine

## 2013-03-01 VITALS — BP 144/88 | HR 64 | Temp 99.0°F | Wt 133.0 lb

## 2013-03-01 DIAGNOSIS — J988 Other specified respiratory disorders: Secondary | ICD-10-CM

## 2013-03-01 DIAGNOSIS — J329 Chronic sinusitis, unspecified: Secondary | ICD-10-CM

## 2013-03-01 DIAGNOSIS — J22 Unspecified acute lower respiratory infection: Secondary | ICD-10-CM

## 2013-03-01 DIAGNOSIS — H6120 Impacted cerumen, unspecified ear: Secondary | ICD-10-CM

## 2013-03-01 DIAGNOSIS — H6121 Impacted cerumen, right ear: Secondary | ICD-10-CM

## 2013-03-01 MED ORDER — DOXYCYCLINE HYCLATE 100 MG PO CAPS: 100.0000 mg | ORAL_CAPSULE | Freq: Two times a day (BID) | ORAL | Status: DC
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
## 2013-03-01 NOTE — Patient Instructions (Addendum)
Antibiotic for poss sinusitis .   Expect t improvement in the next 3-5 days.   Continue saline washes  Home made cough med as tolerated.  Ear  Try debrox  Right ear.   Use at night  adn if needed  we can recheck and try to remove. May need to see an ear doctor to remove .   Sinusitis Sinusitis is redness, soreness, and swelling (inflammation) of the paranasal sinuses. Paranasal sinuses are air pockets within the bones of your face (beneath the eyes, the middle of the forehead, or above the eyes). In healthy paranasal sinuses, mucus is able to drain out, and air is able to circulate through them by way of your nose. However, when your paranasal sinuses are inflamed, mucus and air can become trapped. This can allow bacteria and other germs to grow and cause infection. Sinusitis can develop quickly and last only a short time (acute) or continue over a long period (chronic). Sinusitis that lasts for more than 12 weeks is considered chronic.  CAUSES  Causes of sinusitis include:  Allergies.  Structural abnormalities, such as displacement of the cartilage that separates your nostrils (deviated septum), which can decrease the air flow through your nose and sinuses and affect sinus drainage.  Functional abnormalities, such as when the small hairs (cilia) that line your sinuses and help remove mucus do not work properly or are not present. SYMPTOMS  Symptoms of acute and chronic sinusitis are the same. The primary symptoms are pain and pressure around the affected sinuses. Other symptoms include:  Upper toothache.  Earache.  Headache.  Bad breath.  Decreased sense of smell and taste.  A cough, which worsens when you are lying flat.  Fatigue.  Fever.  Thick drainage from your nose, which often is green and may contain pus (purulent).  Swelling and warmth over the affected sinuses. DIAGNOSIS  Your caregiver will perform a physical exam. During the exam, your caregiver may:  Look in  your nose for signs of abnormal growths in your nostrils (nasal polyps).  Tap over the affected sinus to check for signs of infection.  View the inside of your sinuses (endoscopy) with a special imaging device with a light attached (endoscope), which is inserted into your sinuses. If your caregiver suspects that you have chronic sinusitis, one or more of the following tests may be recommended:  Allergy tests.  Nasal culture A sample of mucus is taken from your nose and sent to a lab and screened for bacteria.  Nasal cytology A sample of mucus is taken from your nose and examined by your caregiver to determine if your sinusitis is related to an allergy. TREATMENT  Most cases of acute sinusitis are related to a viral infection and will resolve on their own within 10 days. Sometimes medicines are prescribed to help relieve symptoms (pain medicine, decongestants, nasal steroid sprays, or saline sprays).  However, for sinusitis related to a bacterial infection, your caregiver will prescribe antibiotic medicines. These are medicines that will help kill the bacteria causing the infection.  Rarely, sinusitis is caused by a fungal infection. In theses cases, your caregiver will prescribe antifungal medicine. For some cases of chronic sinusitis, surgery is needed. Generally, these are cases in which sinusitis recurs more than 3 times per year, despite other treatments. HOME CARE INSTRUCTIONS   Drink plenty of water. Water helps thin the mucus so your sinuses can drain more easily.  Use a humidifier.  Inhale steam 3 to 4 times a  day (for example, sit in the bathroom with the shower running).  Apply a warm, moist washcloth to your face 3 to 4 times a day, or as directed by your caregiver.  Use saline nasal sprays to help moisten and clean your sinuses.  Take over-the-counter or prescription medicines for pain, discomfort, or fever only as directed by your caregiver. SEEK IMMEDIATE MEDICAL CARE  IF:  You have increasing pain or severe headaches.  You have nausea, vomiting, or drowsiness.  You have swelling around your face.  You have vision problems.  You have a stiff neck.  You have difficulty breathing. MAKE SURE YOU:   Understand these instructions.  Will watch your condition.  Will get help right away if you are not doing well or get worse. Document Released: 03/30/2005 Document Revised: 06/22/2011 Document Reviewed: 04/14/2011 [facility] Patient Information 2014 [place], [place].

## 2013-03-05 ENCOUNTER — Other Ambulatory Visit: Payer: Self-pay | Admitting: Internal Medicine

## 2013-03-06 ENCOUNTER — Other Ambulatory Visit: Payer: Self-pay | Admitting: Internal Medicine

## 2013-03-06 NOTE — Telephone Encounter (Signed)
Pt has not had a TSH since 02/12/2012.  Please advise.  Thanks!

## 2013-03-06 NOTE — Telephone Encounter (Signed)
Arrange her to have a preventive visit with labs in Jan .  Refill  Thyroid medication until then ( 2 months)?

## 2013-03-07 ENCOUNTER — Telehealth: Payer: Self-pay | Admitting: Family Medicine

## 2013-03-07 NOTE — Telephone Encounter (Signed)
Error

## 2013-03-15 ENCOUNTER — Other Ambulatory Visit: Payer: Self-pay | Admitting: Internal Medicine

## 2013-03-16 NOTE — Telephone Encounter (Signed)
See first message ? Duplicate?

## 2013-03-16 NOTE — Telephone Encounter (Signed)
Labs not done since 10/2011.  Please advise.  Thanks!

## 2013-03-16 NOTE — Telephone Encounter (Signed)
Schedule pt for BMP lipid lft and TSH ( doesn't have to fast) Refill medication for 2 months  Needs ov before runs out

## 2013-03-17 ENCOUNTER — Telehealth: Payer: Self-pay | Admitting: Family Medicine

## 2013-03-17 ENCOUNTER — Encounter: Payer: Self-pay | Admitting: *Deleted

## 2013-03-17 NOTE — Telephone Encounter (Signed)
This patient needs lab work and a follow up visit in 1 month per [facility]. I have placed the orders for the lab work.  Please call the patient and make both appointments.  Thanks!

## 2013-03-17 NOTE — Telephone Encounter (Signed)
lmom 

## 2013-03-20 ENCOUNTER — Other Ambulatory Visit: Payer: Medicare Other

## 2013-03-20 ENCOUNTER — Other Ambulatory Visit: Payer: Self-pay | Admitting: Family Medicine

## 2013-03-20 DIAGNOSIS — Z0000 Encounter for general adult medical examination without abnormal findings: Secondary | ICD-10-CM

## 2013-04-17 ENCOUNTER — Encounter: Payer: Self-pay | Admitting: Internal Medicine

## 2013-04-17 ENCOUNTER — Ambulatory Visit (INDEPENDENT_AMBULATORY_CARE_PROVIDER_SITE_OTHER): Payer: Medicare Other | Admitting: Internal Medicine

## 2013-04-17 VITALS — BP 128/64 | HR 62 | Temp 99.0°F | Wt 134.0 lb

## 2013-04-17 DIAGNOSIS — M47817 Spondylosis without myelopathy or radiculopathy, lumbosacral region: Secondary | ICD-10-CM

## 2013-04-17 DIAGNOSIS — I10 Essential (primary) hypertension: Secondary | ICD-10-CM

## 2013-04-17 DIAGNOSIS — R3915 Urgency of urination: Secondary | ICD-10-CM

## 2013-04-17 DIAGNOSIS — G589 Mononeuropathy, unspecified: Secondary | ICD-10-CM

## 2013-04-17 DIAGNOSIS — G4700 Insomnia, unspecified: Secondary | ICD-10-CM

## 2013-04-17 DIAGNOSIS — E039 Hypothyroidism, unspecified: Secondary | ICD-10-CM

## 2013-04-17 DIAGNOSIS — E785 Hyperlipidemia, unspecified: Secondary | ICD-10-CM

## 2013-04-17 DIAGNOSIS — G629 Polyneuropathy, unspecified: Secondary | ICD-10-CM

## 2013-04-17 DIAGNOSIS — M47816 Spondylosis without myelopathy or radiculopathy, lumbar region: Secondary | ICD-10-CM

## 2013-04-17 LAB — POCT URINALYSIS DIP (MANUAL ENTRY)
Bilirubin, UA: NEGATIVE
Blood, UA: NEGATIVE
GLUCOSE UA: NEGATIVE
Ketones, POC UA: NEGATIVE
Nitrite, UA: NEGATIVE
PROTEIN UA: NEGATIVE
SPEC GRAV UA: 1.01
UROBILINOGEN UA: 0.2
pH, UA: 7

## 2013-04-17 MED ORDER — GABAPENTIN 300 MG PO CAPS: ORAL_CAPSULE | ORAL | Status: DC

## 2013-04-17 MED ORDER — ATENOLOL-CHLORTHALIDONE 50-25 MG PO TABS: 0.5000 | ORAL_TABLET | Freq: Every day | ORAL | Status: DC
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
## 2013-04-17 NOTE — Patient Instructions (Signed)
Take caution with using lorazepam pain pill and avoid alcohol.  Can try gabapentin 300 mg a day increased to twice a day after 5-7 days and then increase to 3 times a day after 5-7 days. This can cause some drowsiness but can help nerve pain in many people.  This would be used instead of the Lyrica which is more expensive.  We can change her blood pressure medication to something similar can follow this up. Take a half a pill once a day and monitor your blood pressure readings if they are increasing we can switch to a half a pill twice a day.  Plan followup visit in about 2 months to see what her blood pressure is doing and the Neurontin is doing. We may need to recheck her potassium level at that time.

## 2013-04-17 NOTE — Progress Notes (Signed)
Pre visit review using our clinic review tool, if applicable. No additional management support is needed unless otherwise documented below in the visit note. 

## 2013-04-23 DIAGNOSIS — E785 Hyperlipidemia, unspecified: Secondary | ICD-10-CM | POA: Insufficient documentation

## 2013-04-23 DIAGNOSIS — E782 Mixed hyperlipidemia: Secondary | ICD-10-CM | POA: Insufficient documentation

## 2013-04-23 DIAGNOSIS — E039 Hypothyroidism, unspecified: Secondary | ICD-10-CM | POA: Insufficient documentation

## 2013-04-23 DIAGNOSIS — R3915 Urgency of urination: Secondary | ICD-10-CM | POA: Insufficient documentation

## 2013-04-23 DIAGNOSIS — I10 Essential (primary) hypertension: Secondary | ICD-10-CM | POA: Insufficient documentation

## 2013-04-26 ENCOUNTER — Telehealth: Payer: Self-pay | Admitting: Family Medicine

## 2013-04-26 NOTE — Telephone Encounter (Signed)
lmom for pt to call and sch lab appt!!

## 2013-04-26 NOTE — Telephone Encounter (Signed)
Please call the pt and make lab appt.  Pt needs to be fasting.  Thanks!

## 2013-04-26 NOTE — Telephone Encounter (Signed)
Message copied by Hulda Humphrey on Wed Apr 26, 2013  9:38 AM ------      Message from: [facility], [place] K      Created: Sun Apr 23, 2013 11:47 AM      Regarding: blood tests not done        i have been waiting for patient blood work ( orders from 12 14but I dont see that they were done.       Pt was sent to lab 1/ 5 visit and only UA was done.       Contact patient and get this done asap.      Thanks !      WP       ------

## 2013-04-27 NOTE — Telephone Encounter (Signed)
appt scheduled 05/11/13, pt aware.

## 2013-04-27 NOTE — Telephone Encounter (Signed)
lmom 

## 2013-05-06 ENCOUNTER — Other Ambulatory Visit: Payer: Self-pay | Admitting: Internal Medicine

## 2013-05-11 ENCOUNTER — Other Ambulatory Visit (INDEPENDENT_AMBULATORY_CARE_PROVIDER_SITE_OTHER): Payer: Medicare Other

## 2013-05-11 DIAGNOSIS — Z0000 Encounter for general adult medical examination without abnormal findings: Secondary | ICD-10-CM

## 2013-05-11 DIAGNOSIS — I10 Essential (primary) hypertension: Secondary | ICD-10-CM

## 2013-05-11 DIAGNOSIS — E785 Hyperlipidemia, unspecified: Secondary | ICD-10-CM

## 2013-05-11 LAB — CBC WITH DIFFERENTIAL/PLATELET
BASOS PCT: 0.5 % (ref 0.0–3.0)
Basophils Absolute: 0 10*3/uL (ref 0.0–0.1)
EOS ABS: 0.1 10*3/uL (ref 0.0–0.7)
EOS PCT: 1.9 % (ref 0.0–5.0)
HEMATOCRIT: 44.2 % (ref 36.0–46.0)
HEMOGLOBIN: 14.2 g/dL (ref 12.0–15.0)
LYMPHS ABS: 2.6 10*3/uL (ref 0.7–4.0)
Lymphocytes Relative: 37.4 % (ref 12.0–46.0)
MCHC: 32.2 g/dL (ref 30.0–36.0)
MCV: 94.4 fl (ref 78.0–100.0)
MONO ABS: 0.6 10*3/uL (ref 0.1–1.0)
Monocytes Relative: 9.1 % (ref 3.0–12.0)
NEUTROS ABS: 3.6 10*3/uL (ref 1.4–7.7)
NEUTROS PCT: 51.1 % (ref 43.0–77.0)
Platelets: 192 10*3/uL (ref 150.0–400.0)
RBC: 4.68 Mil/uL (ref 3.87–5.11)
RDW: 13.3 % (ref 11.5–14.6)
WBC: 7 10*3/uL (ref 4.5–10.5)

## 2013-05-11 LAB — BASIC METABOLIC PANEL
BUN: 13 mg/dL (ref 6–23)
CHLORIDE: 97 meq/L (ref 96–112)
CO2: 29 mEq/L (ref 19–32)
Calcium: 9.2 mg/dL (ref 8.4–10.5)
Creatinine, Ser: 0.6 mg/dL (ref 0.4–1.2)
GFR: 105.05 mL/min (ref >60.00)
Glucose, Bld: 147 mg/dL — ABNORMAL HIGH (ref 70–99)
POTASSIUM: 4.2 meq/L (ref 3.5–5.1)
SODIUM: 132 meq/L — AB (ref 135–145)

## 2013-05-11 LAB — HEPATIC FUNCTION PANEL
ALBUMIN: 4.1 g/dL (ref 3.5–5.2)
ALT: 14 U/L (ref 0–35)
AST: 19 U/L (ref 0–37)
Alkaline Phosphatase: 88 U/L (ref 39–117)
BILIRUBIN DIRECT: 0 mg/dL (ref 0.0–0.3)
TOTAL PROTEIN: 7 g/dL (ref 6.0–8.3)
Total Bilirubin: 0.7 mg/dL (ref 0.3–1.2)

## 2013-05-11 LAB — LIPID PANEL
CHOLESTEROL: 167 mg/dL (ref 0–200)
HDL: 57.8 mg/dL (ref >39.00)
LDL CALC: 91 mg/dL (ref 0–99)
TRIGLYCERIDES: 91 mg/dL (ref 0.0–149.0)
Total CHOL/HDL Ratio: 3
VLDL: 18.2 mg/dL (ref 0.0–40.0)

## 2013-05-11 LAB — TSH: TSH: 1.26 u[IU]/mL (ref 0.35–5.50)

## 2013-06-10 ENCOUNTER — Other Ambulatory Visit: Payer: Self-pay | Admitting: Internal Medicine

## 2013-06-21 ENCOUNTER — Encounter: Payer: Self-pay | Admitting: Internal Medicine

## 2013-07-03 ENCOUNTER — Encounter: Payer: Self-pay | Admitting: Internal Medicine

## 2013-07-03 ENCOUNTER — Ambulatory Visit (INDEPENDENT_AMBULATORY_CARE_PROVIDER_SITE_OTHER): Payer: Medicare Other | Admitting: Internal Medicine

## 2013-07-03 VITALS — BP 132/86 | Temp 99.0°F | Ht 62.75 in | Wt 136.0 lb

## 2013-07-03 DIAGNOSIS — I10 Essential (primary) hypertension: Secondary | ICD-10-CM

## 2013-07-03 DIAGNOSIS — G4700 Insomnia, unspecified: Secondary | ICD-10-CM

## 2013-07-03 DIAGNOSIS — Z79899 Other long term (current) drug therapy: Secondary | ICD-10-CM | POA: Insufficient documentation

## 2013-07-03 DIAGNOSIS — IMO0002 Reserved for concepts with insufficient information to code with codable children: Secondary | ICD-10-CM

## 2013-07-03 DIAGNOSIS — M48061 Spinal stenosis, lumbar region without neurogenic claudication: Secondary | ICD-10-CM

## 2013-07-03 DIAGNOSIS — Z79891 Long term (current) use of opiate analgesic: Secondary | ICD-10-CM

## 2013-07-03 DIAGNOSIS — M5410 Radiculopathy, site unspecified: Secondary | ICD-10-CM

## 2013-07-03 MED ORDER — LORAZEPAM 1 MG PO TABS: 1.0000 mg | ORAL_TABLET | Freq: Every day | ORAL | Status: DC

## 2013-07-03 MED ORDER — HYDROCODONE-ACETAMINOPHEN 10-650 MG PO TABS: ORAL_TABLET | ORAL | Status: DC

## 2013-07-03 MED ORDER — LORAZEPAM 1 MG PO TABS
1.0000 mg | ORAL_TABLET | Freq: Every day | ORAL | Status: DC
Start: 2013-07-03 — End: 2013-07-03

## 2013-07-03 NOTE — Assessment & Plan Note (Signed)
Risk-benefit reviewed continue at this time. Cautions reviewed

## 2013-07-03 NOTE — Patient Instructions (Addendum)
Continue lifestyle intervention healthy eating and exercise . Caution with medications, ROV in 3-4 months or as needed
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
## 2013-07-04 ENCOUNTER — Telehealth: Payer: Self-pay | Admitting: Internal Medicine

## 2013-07-04 NOTE — Telephone Encounter (Signed)
Relevant patient education assigned to patient using Emmi. ° °

## 2013-07-19 ENCOUNTER — Encounter: Payer: Self-pay | Admitting: Cardiology

## 2013-07-19 ENCOUNTER — Ambulatory Visit: Payer: Medicare Other | Attending: Cardiology | Admitting: Cardiology

## 2013-07-19 VITALS — BP 162/92 | HR 70 | Temp 99.3°F | Resp 16 | Ht 64.0 in | Wt 135.0 lb

## 2013-07-19 DIAGNOSIS — I10 Essential (primary) hypertension: Secondary | ICD-10-CM

## 2013-07-19 DIAGNOSIS — E039 Hypothyroidism, unspecified: Secondary | ICD-10-CM | POA: Insufficient documentation

## 2013-07-19 DIAGNOSIS — I2510 Atherosclerotic heart disease of native coronary artery without angina pectoris: Secondary | ICD-10-CM | POA: Insufficient documentation

## 2013-07-19 DIAGNOSIS — Z79899 Other long term (current) drug therapy: Secondary | ICD-10-CM | POA: Insufficient documentation

## 2013-07-19 DIAGNOSIS — E785 Hyperlipidemia, unspecified: Secondary | ICD-10-CM | POA: Insufficient documentation

## 2013-07-19 DIAGNOSIS — Z7982 Long term (current) use of aspirin: Secondary | ICD-10-CM | POA: Insufficient documentation

## 2013-07-19 DIAGNOSIS — Z09 Encounter for follow-up examination after completed treatment for conditions other than malignant neoplasm: Secondary | ICD-10-CM | POA: Insufficient documentation

## 2013-07-19 NOTE — Patient Instructions (Signed)
Patient instructed to f/u in 1 year

## 2013-07-19 NOTE — Assessment & Plan Note (Signed)
Stable. Continue secondary preventative therapy. Return in one year or when necessary.

## 2013-07-19 NOTE — Progress Notes (Signed)
Pt comes in today to f/u with Dr. Verl Blalock for Hx. CAD,HTn controlled Taking prescribed medications daily Denies CP today' VSS
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
## 2013-07-28 ENCOUNTER — Encounter: Payer: Self-pay | Admitting: *Deleted

## 2013-08-02 ENCOUNTER — Encounter: Payer: Self-pay | Admitting: Internal Medicine

## 2013-08-17 ENCOUNTER — Telehealth: Payer: Self-pay | Admitting: Internal Medicine

## 2013-08-17 NOTE — Telephone Encounter (Signed)
Pt states she has same thing as last fall.(sinus/allergy/runny nose etc)  Has tried OTC w/ no success. Would like md to call in doxycycline (VIBRAMYCIN) 100 MG capsule Refused appt . [facility]/ [place]

## 2013-08-18 NOTE — Telephone Encounter (Signed)
Left a message on home phone for the pt to return my call. 

## 2013-08-18 NOTE — Telephone Encounter (Signed)
Pt returned your call. Will be in for a little while if you could cb soon

## 2013-08-18 NOTE — Telephone Encounter (Signed)
Spoke to the pt and informed her that she should go to [facility] per [facility]  Antibiotic denied at this time.

## 2013-08-18 NOTE — Telephone Encounter (Signed)
Spoke to the pt.  She complains of cough w/ yellow sputum, nasal congestion w/yellow sputum, sinus pressure and pain, sinus headache and sneezing.  Ongoing for 2 weeks.  Denies fever.  Would like an antibiotic called to the pharmacy.  Please advise.  Thanks!

## 2013-08-18 NOTE — Telephone Encounter (Signed)
Tried reaching the patient.  No answering machine.

## 2013-08-19 ENCOUNTER — Ambulatory Visit: Payer: Medicare Other | Admitting: Family Medicine

## 2013-08-28 ENCOUNTER — Other Ambulatory Visit: Payer: Self-pay | Admitting: Internal Medicine

## 2013-09-06 ENCOUNTER — Other Ambulatory Visit: Payer: Self-pay

## 2013-09-06 DIAGNOSIS — Z1231 Encounter for screening mammogram for malignant neoplasm of breast: Secondary | ICD-10-CM

## 2013-09-28 ENCOUNTER — Ambulatory Visit
Admission: RE | Admit: 2013-09-28 | Discharge: 2013-09-28 | Disposition: A | Discharge status: Home / Self Care | Payer: Medicare Other | Source: Ambulatory Visit

## 2013-09-28 ENCOUNTER — Ambulatory Visit: Payer: Medicare Other

## 2013-09-28 DIAGNOSIS — Z1231 Encounter for screening mammogram for malignant neoplasm of breast: Secondary | ICD-10-CM

## 2014-01-08 ENCOUNTER — Encounter: Payer: Self-pay | Admitting: Internal Medicine

## 2014-02-17 ENCOUNTER — Telehealth: Payer: Self-pay

## 2014-02-17 HISTORY — PX: CATARACT EXTRACTION: SUR2

## 2014-02-20 NOTE — Telephone Encounter (Signed)
Pt needs AWV

## 2014-03-19 ENCOUNTER — Ambulatory Visit (AMBULATORY_SURGERY_CENTER): Payer: Self-pay | Admitting: *Deleted

## 2014-03-19 ENCOUNTER — Telehealth: Payer: Self-pay | Admitting: Internal Medicine

## 2014-03-19 VITALS — Ht 64.0 in | Wt 133.2 lb

## 2014-03-19 DIAGNOSIS — Z86010 Personal history of colonic polyps: Secondary | ICD-10-CM

## 2014-03-19 DIAGNOSIS — Z800 Family history of malignant neoplasm of digestive organs: Secondary | ICD-10-CM

## 2014-03-19 MED ORDER — MOVIPREP 100 G PO SOLR: 1.0000 | Freq: Once | ORAL | Status: DC

## 2014-03-19 NOTE — Progress Notes (Signed)
No egg or soy allergy. ewm No issues with past sedation. ewm No blood thinners, no diet pills. ewm No emmi video. ewm

## 2014-03-19 NOTE — Telephone Encounter (Signed)
Patient advised moviprep coupon is placed at front desk for her.

## 2014-03-30 ENCOUNTER — Ambulatory Visit (AMBULATORY_SURGERY_CENTER): Payer: Medicare Other | Admitting: Internal Medicine

## 2014-03-30 ENCOUNTER — Encounter: Payer: Self-pay | Admitting: Internal Medicine

## 2014-03-30 VITALS — BP 158/91 | HR 66 | Temp 97.5°F | Resp 19 | Ht 64.0 in | Wt 133.0 lb

## 2014-03-30 DIAGNOSIS — D125 Benign neoplasm of sigmoid colon: Secondary | ICD-10-CM

## 2014-03-30 DIAGNOSIS — Z86010 Personal history of colonic polyps: Secondary | ICD-10-CM

## 2014-03-30 DIAGNOSIS — D122 Benign neoplasm of ascending colon: Secondary | ICD-10-CM

## 2014-03-30 MED ORDER — SODIUM CHLORIDE 0.9 % IV SOLN: 500.0000 mL | INTRAVENOUS | Status: DC

## 2014-03-30 NOTE — Progress Notes (Deleted)
Called to room to assist during endoscopic procedure.  Patient ID and intended procedure confirmed with present staff. Received instructions for my participation in the procedure from the performing physician.  

## 2014-03-30 NOTE — Op Note (Signed)
[place]  [address]. [place], [address]   COLONOSCOPY PROCEDURE REPORT  PATIENT: Christina [name], Christina [name]  MR#: [id number] BIRTHDATE: 19-Mar-1935 , 7  yrs. old GENDER: female ENDOSCOPIST: Lafayette Dragon, MD REFERRED JG:GEZM Perini, M.D. PROCEDURE DATE:  03/30/2014 PROCEDURE:   Colonoscopy with snare polypectomy First Screening Colonoscopy - Avg.  risk and is 50 yrs.  old or older - No.  Prior Negative Screening - Now for repeat screening. N/A  History of Adenoma - Now for follow-up colonoscopy & has been > or = to 3 yrs.  Yes hx of adenoma.  Has been 3 or more years since last colonoscopy.  Polyps Removed Today? Yes. ASA CLASS:   Class II INDICATIONS:adenomatous polyp on 2007 colonoscopy.  Hyperplastic polyp in 2001.  Last colonoscopy July 2010 no polyps found. Positive family history of colon cancer in patient's son. MEDICATIONS: Monitored anesthesia care and Propofol 250 mg IV  DESCRIPTION OF PROCEDURE:   After the risks benefits and alternatives of the procedure were thoroughly explained, informed consent was obtained.  The digital rectal exam revealed no abnormalities of the rectum.   The LB [id number]  endoscope was introduced through the anus and advanced to the cecum, which was identified by both the appendix and ileocecal valve. No adverse events experienced.   The quality of the prep was good, using MoviPrep  The instrument was then slowly withdrawn as the colon was fully examined.      COLON FINDINGS: Two polypoid shaped sessile polyps measuring 6 mm in size were found in the sigmoid colon and ascending colon.  A polypectomy was performed with a cold snare.  The resection was complete, the polyp tissue was completely retrieved and sent to histology.  Retroflexed views revealed no abnormalities. The time to cecum=8 minutes 12 seconds.  Withdrawal time=8 minutes 11 seconds.  The scope was withdrawn and the procedure completed. COMPLICATIONS:  There were no immediate complications.  ENDOSCOPIC IMPRESSION: Two sessile polyps were found in the sigmoid colon and ascending colon; polypectomy was performed with a cold snare  RECOMMENDATIONS: Await pathology results no recall colonoscopy due to age High-fiber diet  eSigned:  Lafayette Dragon, MD 03/30/2014 10:38 AM   cc:   PATIENT NAME:  Daryan, Cagley MR#: [id number]

## 2014-03-30 NOTE — Progress Notes (Signed)
A/ox3 pleased with MAC, report to Jane RN 

## 2014-03-30 NOTE — Progress Notes (Signed)
Called to room to assist during endoscopic procedure.  Patient ID and intended procedure confirmed with present staff. Received instructions for my participation in the procedure from the performing physician.  

## 2014-03-30 NOTE — Patient Instructions (Signed)

## 2014-04-02 ENCOUNTER — Telehealth: Payer: Self-pay | Admitting: *Deleted

## 2014-04-02 NOTE — Telephone Encounter (Signed)
  Follow up Call-  Call back number 03/30/2014  Post procedure Call Back phone  # [phone] hm  Permission to leave phone message Yes     Patient questions:  Do you have a fever, pain , or abdominal swelling? No. Pain Score  0 *  Have you tolerated food without any problems? Yes.    Have you been able to return to your normal activities? Yes.    Do you have any questions about your discharge instructions: Diet   No. Medications  No. Follow up visit  No.  Do you have questions or concerns about your Care? No.  Actions: * If pain score is 4 or above: No action needed, pain <4.

## 2014-04-03 ENCOUNTER — Encounter: Payer: Self-pay | Admitting: Internal Medicine

## 2014-06-05 ENCOUNTER — Encounter: Payer: Self-pay | Admitting: *Deleted

## 2014-10-03 ENCOUNTER — Other Ambulatory Visit: Payer: Self-pay

## 2014-10-03 DIAGNOSIS — Z1231 Encounter for screening mammogram for malignant neoplasm of breast: Secondary | ICD-10-CM

## 2014-10-08 ENCOUNTER — Other Ambulatory Visit: Payer: Self-pay

## 2014-10-23 ENCOUNTER — Encounter: Payer: Self-pay | Admitting: Internal Medicine

## 2014-11-05 ENCOUNTER — Ambulatory Visit
Admission: RE | Admit: 2014-11-05 | Discharge: 2014-11-05 | Disposition: A | Discharge status: Home / Self Care | Payer: Medicare Other | Source: Ambulatory Visit

## 2014-11-05 DIAGNOSIS — Z1231 Encounter for screening mammogram for malignant neoplasm of breast: Secondary | ICD-10-CM

## 2015-09-10 ENCOUNTER — Emergency Department (HOSPITAL_COMMUNITY): Payer: No Typology Code available for payment source

## 2015-09-10 ENCOUNTER — Encounter (HOSPITAL_COMMUNITY): Payer: Self-pay

## 2015-09-10 ENCOUNTER — Emergency Department (HOSPITAL_COMMUNITY)
Admission: EM | Admit: 2015-09-10 | Discharge: 2015-09-11 | Disposition: A | Discharge status: Home / Self Care | Payer: No Typology Code available for payment source | Attending: Emergency Medicine | Admitting: Emergency Medicine

## 2015-09-10 DIAGNOSIS — Y998 Other external cause status: Secondary | ICD-10-CM | POA: Diagnosis not present

## 2015-09-10 DIAGNOSIS — Z79899 Other long term (current) drug therapy: Secondary | ICD-10-CM | POA: Insufficient documentation

## 2015-09-10 DIAGNOSIS — I2510 Atherosclerotic heart disease of native coronary artery without angina pectoris: Secondary | ICD-10-CM | POA: Insufficient documentation

## 2015-09-10 DIAGNOSIS — S79912A Unspecified injury of left hip, initial encounter: Secondary | ICD-10-CM | POA: Diagnosis not present

## 2015-09-10 DIAGNOSIS — S60511A Abrasion of right hand, initial encounter: Secondary | ICD-10-CM | POA: Insufficient documentation

## 2015-09-10 DIAGNOSIS — S40022A Contusion of left upper arm, initial encounter: Secondary | ICD-10-CM | POA: Insufficient documentation

## 2015-09-10 DIAGNOSIS — S0003XA Contusion of scalp, initial encounter: Secondary | ICD-10-CM | POA: Insufficient documentation

## 2015-09-10 DIAGNOSIS — Z87440 Personal history of urinary (tract) infections: Secondary | ICD-10-CM | POA: Insufficient documentation

## 2015-09-10 DIAGNOSIS — S3992XA Unspecified injury of lower back, initial encounter: Secondary | ICD-10-CM | POA: Insufficient documentation

## 2015-09-10 DIAGNOSIS — Y9289 Other specified places as the place of occurrence of the external cause: Secondary | ICD-10-CM | POA: Insufficient documentation

## 2015-09-10 DIAGNOSIS — Z7982 Long term (current) use of aspirin: Secondary | ICD-10-CM | POA: Diagnosis not present

## 2015-09-10 DIAGNOSIS — R40241 Glasgow coma scale score 13-15, unspecified time: Secondary | ICD-10-CM | POA: Diagnosis not present

## 2015-09-10 DIAGNOSIS — S61412A Laceration without foreign body of left hand, initial encounter: Secondary | ICD-10-CM | POA: Diagnosis not present

## 2015-09-10 DIAGNOSIS — E785 Hyperlipidemia, unspecified: Secondary | ICD-10-CM | POA: Insufficient documentation

## 2015-09-10 DIAGNOSIS — Z86010 Personal history of colonic polyps: Secondary | ICD-10-CM | POA: Diagnosis not present

## 2015-09-10 DIAGNOSIS — G8929 Other chronic pain: Secondary | ICD-10-CM | POA: Diagnosis not present

## 2015-09-10 DIAGNOSIS — I10 Essential (primary) hypertension: Secondary | ICD-10-CM | POA: Diagnosis not present

## 2015-09-10 DIAGNOSIS — Y9389 Activity, other specified: Secondary | ICD-10-CM | POA: Insufficient documentation

## 2015-09-10 DIAGNOSIS — E039 Hypothyroidism, unspecified: Secondary | ICD-10-CM | POA: Insufficient documentation

## 2015-09-10 DIAGNOSIS — F329 Major depressive disorder, single episode, unspecified: Secondary | ICD-10-CM | POA: Insufficient documentation

## 2015-09-10 DIAGNOSIS — S4992XA Unspecified injury of left shoulder and upper arm, initial encounter: Secondary | ICD-10-CM | POA: Diagnosis present

## 2015-09-10 DIAGNOSIS — Z23 Encounter for immunization: Secondary | ICD-10-CM | POA: Insufficient documentation

## 2015-09-10 LAB — URINALYSIS, ROUTINE W REFLEX MICROSCOPIC
Bilirubin Urine: NEGATIVE
GLUCOSE, UA: NEGATIVE mg/dL
Ketones, ur: NEGATIVE mg/dL
Nitrite: NEGATIVE
PROTEIN: NEGATIVE mg/dL
SPECIFIC GRAVITY, URINE: 1.028 (ref 1.005–1.030)
pH: 6 (ref 5.0–8.0)

## 2015-09-10 LAB — I-STAT CHEM 8, ED
BUN: 13 mg/dL (ref 6–20)
CHLORIDE: 100 mmol/L — AB (ref 101–111)
Calcium, Ion: 1.09 mmol/L — ABNORMAL LOW (ref 1.13–1.30)
Creatinine, Ser: 0.5 mg/dL (ref 0.44–1.00)
Glucose, Bld: 130 mg/dL — ABNORMAL HIGH (ref 65–99)
HCT: 40 % (ref 36.0–46.0)
HEMOGLOBIN: 13.6 g/dL (ref 12.0–15.0)
POTASSIUM: 3.9 mmol/L (ref 3.5–5.1)
SODIUM: 136 mmol/L (ref 135–145)
TCO2: 22 mmol/L (ref 0–100)

## 2015-09-10 LAB — CBC WITH DIFFERENTIAL/PLATELET
BASOS ABS: 0.1 10*3/uL (ref 0.0–0.1)
Basophils Relative: 0 %
EOS PCT: 2 %
Eosinophils Absolute: 0.2 10*3/uL (ref 0.0–0.7)
HCT: 41.6 % (ref 36.0–46.0)
HEMOGLOBIN: 13.4 g/dL (ref 12.0–15.0)
LYMPHS ABS: 1.8 10*3/uL (ref 0.7–4.0)
LYMPHS PCT: 16 %
MCH: 29.9 pg (ref 26.0–34.0)
MCHC: 32.2 g/dL (ref 30.0–36.0)
MCV: 92.9 fL (ref 78.0–100.0)
Monocytes Absolute: 0.9 10*3/uL (ref 0.1–1.0)
Monocytes Relative: 8 %
NEUTROS PCT: 74 %
Neutro Abs: 8.4 10*3/uL — ABNORMAL HIGH (ref 1.7–7.7)
PLATELETS: 199 10*3/uL (ref 150–400)
RBC: 4.48 MIL/uL (ref 3.87–5.11)
RDW: 12.6 % (ref 11.5–15.5)
WBC: 11.3 10*3/uL — AB (ref 4.0–10.5)

## 2015-09-10 LAB — TYPE AND SCREEN
ABO/RH(D): O POS
Antibody Screen: NEGATIVE

## 2015-09-10 LAB — URINE MICROSCOPIC-ADD ON

## 2015-09-10 LAB — ABO/RH: ABO/RH(D): O POS

## 2015-09-10 MED ORDER — FENTANYL CITRATE (PF) 100 MCG/2ML IJ SOLN
100.0000 ug | Freq: Once | INTRAMUSCULAR | Status: AC
  Administered 2015-09-10: 100 ug via INTRAVENOUS
  Filled 2015-09-10: qty 2

## 2015-09-10 MED ORDER — HYDROCODONE-ACETAMINOPHEN 5-325 MG PO TABS
1.0000 | ORAL_TABLET | Freq: Once | ORAL | Status: AC
  Administered 2015-09-10: 1 via ORAL
  Filled 2015-09-10: qty 1

## 2015-09-10 MED ORDER — IOPAMIDOL (ISOVUE-300) INJECTION 61%
INTRAVENOUS | Status: AC
  Administered 2015-09-10: 100 mL via INTRAVENOUS
  Filled 2015-09-10: qty 100

## 2015-09-10 MED ORDER — HYDROCODONE-ACETAMINOPHEN 5-325 MG PO TABS: 1.0000 | ORAL_TABLET | Freq: Four times a day (QID) | ORAL | Status: DC | PRN

## 2015-09-10 MED ORDER — TETANUS-DIPHTH-ACELL PERTUSSIS 5-2.5-18.5 LF-MCG/0.5 IM SUSP
0.5000 mL | Freq: Once | INTRAMUSCULAR | Status: AC
  Administered 2015-09-10: 0.5 mL via INTRAMUSCULAR
  Filled 2015-09-10: qty 0.5
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
## 2015-09-10 NOTE — ED Notes (Signed)
Myself and Erin, RN undressed patient, placed in gown, on monitor, continuous pulse oximetry and blood pressure cuff; c-collar still intact
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
## 2015-09-10 NOTE — ED Notes (Signed)
RN to room to round on pt and C-collar had been removed by pt.

## 2015-09-10 NOTE — ED Notes (Signed)
MD at bedside upon EMS arrival to room

## 2015-09-10 NOTE — ED Notes (Signed)
Steri-strips , non-adhesive pads and kerlix placed on skin tears

## 2015-09-10 NOTE — ED Notes (Signed)
CT called and made aware that portable X-rays are completed and pt is ready for transport.

## 2015-09-10 NOTE — ED Notes (Signed)
Per Winfred Leeds, MD, placed a saline soaked gauze on patient's left hand; visitors at bedside

## 2015-09-10 NOTE — Discharge Instructions (Signed)
Take Tylenol for mild pain or the pain medicine prescribed for bad pain. Don't take Tylenol together with the pain medicine prescribed as the combination can be dangerous to your liver. Get your wounds rechecked by your doctor or an urgent care center in the next 2 days. Signs of infection include redness around the wound, more pain, drainage from the wound fever or feeling ill. Return to the emergency if you feel worse for any reason or if you feel that you may be due to an infection Sterile Tape Wound Care Some cuts and wounds can be closed using sterile tape, also called skin adhesive strips. Skin adhesive strips can be used for shallow (superficial) and simple cuts, wounds, lacerations, and surgical incisions. These strips act in place of stitches to hold the edges of the wound together, allowing for faster healing. Unlike stitches, the adhesive strips do not require needles or anesthetic medicine for placement. The strips will wear off naturally as the wound is healing. It is important to take proper care of your wound at home while it heals.  HOME CARE INSTRUCTIONS  Try to keep the area around your wound clean and dry. Do not allow the adhesive strips to get wet for the first 12 hours.   Do not use any soaps or ointments on the wound for the first 12 hours.   If a bandage (dressing) has been applied, follow your health care provider's instructions for how often to change the dressing. Keep the dressing dry if one has been applied.   Do not remove the adhesive strips. They will fall off on their own. If they do not, you may remove them gently after 10 days. You should gently wet the strips before removing them. For example, this can be done in the shower.  Do not scratch, pick, or rub the wound area.   Protect the wound from further injury until it is healed.   Protect the wound from sun and tanning bed exposure while it is healing and for several weeks after healing.   Only take  over-the-counter or prescription medicines as directed by your health care provider.   Keep all follow-up appointments as directed by your health care provider.  SEEK MEDICAL CARE IF: Your adhesive strips become wet or soaked with blood before the wound has healed. The tape will need to be replaced.  SEEK IMMEDIATE MEDICAL CARE IF:  You have increasing pain in the wound.   You develop a rash after the strips are applied.  Your wound becomes red, swollen, hot, or tender.   You have a red streak that goes away from the wound.   You have pus coming from the wound.   You have increased bleeding from the wound.  You notice a bad smell coming from the wound.   Your wound breaks open. MAKE SURE YOU:  Understand these instructions.  Will watch your condition.  Will get help right away if you are not doing well or get worse.   This information is not intended to replace advice given to you by your health care provider. Make sure you discuss any questions you have with your health care provider.   Document Released: 05/07/2004 Document Revised: 04/20/2014 Document Reviewed: 10/19/2012 Elsevier Interactive Patient Education [employer].

## 2015-09-11 LAB — URINE CULTURE: Special Requests: NORMAL

## 2015-10-02 ENCOUNTER — Other Ambulatory Visit (HOSPITAL_COMMUNITY): Payer: Self-pay | Admitting: Internal Medicine

## 2015-10-02 DIAGNOSIS — R011 Cardiac murmur, unspecified: Secondary | ICD-10-CM

## 2015-10-14 ENCOUNTER — Other Ambulatory Visit (HOSPITAL_COMMUNITY): Payer: Medicare Other

## 2015-10-23 ENCOUNTER — Ambulatory Visit (HOSPITAL_COMMUNITY): Payer: Medicare Other | Attending: Cardiology

## 2015-10-23 ENCOUNTER — Other Ambulatory Visit: Payer: Self-pay

## 2015-10-23 DIAGNOSIS — I059 Rheumatic mitral valve disease, unspecified: Secondary | ICD-10-CM | POA: Diagnosis not present

## 2015-10-23 DIAGNOSIS — I10 Essential (primary) hypertension: Secondary | ICD-10-CM | POA: Insufficient documentation

## 2015-10-23 DIAGNOSIS — I351 Nonrheumatic aortic (valve) insufficiency: Secondary | ICD-10-CM | POA: Diagnosis not present

## 2015-10-23 DIAGNOSIS — R011 Cardiac murmur, unspecified: Secondary | ICD-10-CM | POA: Diagnosis not present

## 2015-10-23 DIAGNOSIS — I2510 Atherosclerotic heart disease of native coronary artery without angina pectoris: Secondary | ICD-10-CM | POA: Insufficient documentation

## 2015-10-23 DIAGNOSIS — E119 Type 2 diabetes mellitus without complications: Secondary | ICD-10-CM | POA: Diagnosis not present

## 2015-10-23 LAB — ECHOCARDIOGRAM COMPLETE
CHL CUP STROKE VOLUME: 33 mL
E decel time: 169 msec
EERAT: 14.32
FS: 27 % — AB (ref 28–44)
IV/PV OW: 1.18
LA ID, A-P, ES: 31 mm
LA diam end sys: 31 mm
LA vol index: 19.5 mL/m2
LA vol: 32 mL
LADIAMINDEX: 1.89 cm/m2
LAVOLA4C: 26 mL
LDCA: 2.84 cm2
LV E/e'average: 14.32
LV TDI E'LATERAL: 6.69
LV sys vol index: 15 mL/m2
LV sys vol: 25 mL
LVDIAVOL: 58 mL (ref 46–106)
LVDIAVOLIN: 35 mL/m2
LVEEMED: 14.32
LVELAT: 6.69 cm/s
LVOT SV: 67 mL
LVOT peak grad rest: 5 mmHg
LVOT peak vel: 111 cm/s
LVOTD: 19 mm
MV Dec: 169
MV pk A vel: 111 m/s
MV pk E vel: 95.8 m/s
MVPG: 4 mmHg
PW: 8.27 mm — AB (ref 0.6–1.1)
RV LATERAL S' VELOCITY: 12.6 cm/s
RV sys press: 27 mmHg
Reg peak vel: 245 cm/s
Simpson's disk: 57
TDI e' medial: 5.7
TRMAXVEL: 245 cm/s

## 2015-11-29 ENCOUNTER — Other Ambulatory Visit: Payer: Self-pay | Admitting: Internal Medicine

## 2015-11-29 DIAGNOSIS — Z1231 Encounter for screening mammogram for malignant neoplasm of breast: Secondary | ICD-10-CM

## 2015-12-06 ENCOUNTER — Ambulatory Visit: Payer: Medicare Other

## 2015-12-26 ENCOUNTER — Ambulatory Visit
Admission: RE | Admit: 2015-12-26 | Discharge: 2015-12-26 | Disposition: A | Discharge status: Home / Self Care | Payer: Medicare Other | Source: Ambulatory Visit | Attending: Internal Medicine | Admitting: Internal Medicine

## 2015-12-26 DIAGNOSIS — Z1231 Encounter for screening mammogram for malignant neoplasm of breast: Secondary | ICD-10-CM

## 2016-01-11 ENCOUNTER — Telehealth: Payer: Self-pay | Admitting: Internal Medicine

## 2016-01-11 NOTE — Telephone Encounter (Signed)
81 year old female, patient of Dr. Delfin Edis Contacted me today is on call physician to report ongoing issues with abdominal pain which she describes as crampy in nature. She denies fever or chills. Denies nausea and vomiting. She has seen Dr. Joylene Draft recently for this pain and he started metronidazole which she is taking 500 mg 3 times a day. It has not resulted in any improvement at this point Right now she doesn't feel like she needs to go to the ER but I made her aware that if pain worsens that she needs to go to the ER this weekend for urgent evaluation. She voiced understanding Will have our office contact her on Monday to try to work her in for an urgent visit with any available provider or advanced practitioner

## 2016-01-13 ENCOUNTER — Ambulatory Visit (INDEPENDENT_AMBULATORY_CARE_PROVIDER_SITE_OTHER): Payer: Medicare Other | Admitting: Internal Medicine

## 2016-01-13 ENCOUNTER — Other Ambulatory Visit (INDEPENDENT_AMBULATORY_CARE_PROVIDER_SITE_OTHER): Payer: Medicare Other

## 2016-01-13 ENCOUNTER — Encounter: Payer: Self-pay | Admitting: Internal Medicine

## 2016-01-13 VITALS — BP 126/70 | HR 62 | Ht 64.0 in | Wt 129.0 lb

## 2016-01-13 DIAGNOSIS — R194 Change in bowel habit: Secondary | ICD-10-CM | POA: Diagnosis not present

## 2016-01-13 DIAGNOSIS — R1013 Epigastric pain: Secondary | ICD-10-CM

## 2016-01-13 DIAGNOSIS — R195 Other fecal abnormalities: Secondary | ICD-10-CM | POA: Diagnosis not present

## 2016-01-13 LAB — CBC WITH DIFFERENTIAL/PLATELET
BASOS PCT: 0.4 % (ref 0.0–3.0)
Basophils Absolute: 0 10*3/uL (ref 0.0–0.1)
Eosinophils Absolute: 0.1 10*3/uL (ref 0.0–0.7)
Eosinophils Relative: 1.2 % (ref 0.0–5.0)
HEMATOCRIT: 43.7 % (ref 36.0–46.0)
HEMOGLOBIN: 14.7 g/dL (ref 12.0–15.0)
LYMPHS ABS: 2.8 10*3/uL (ref 0.7–4.0)
Lymphocytes Relative: 24.7 % (ref 12.0–46.0)
MCHC: 33.7 g/dL (ref 30.0–36.0)
MCV: 90.6 fl (ref 78.0–100.0)
MONO ABS: 0.7 10*3/uL (ref 0.1–1.0)
Monocytes Relative: 6.5 % (ref 3.0–12.0)
NEUTROS ABS: 7.6 10*3/uL (ref 1.4–7.7)
Neutrophils Relative %: 67.2 % (ref 43.0–77.0)
Platelets: 297 10*3/uL (ref 150.0–400.0)
RBC: 4.82 Mil/uL (ref 3.87–5.11)
RDW: 13.4 % (ref 11.5–15.5)
WBC: 11.4 10*3/uL — ABNORMAL HIGH (ref 4.0–10.5)

## 2016-01-13 LAB — COMPREHENSIVE METABOLIC PANEL
ALBUMIN: 4.1 g/dL (ref 3.5–5.2)
ALK PHOS: 81 U/L (ref 39–117)
ALT: 27 U/L (ref 0–35)
AST: 30 U/L (ref 0–37)
BUN: 10 mg/dL (ref 6–23)
CALCIUM: 9.6 mg/dL (ref 8.4–10.5)
CHLORIDE: 99 meq/L (ref 96–112)
CO2: 29 mEq/L (ref 19–32)
CREATININE: 0.56 mg/dL (ref 0.40–1.20)
GFR: 110.8 mL/min (ref >60.00)
Glucose, Bld: 119 mg/dL — ABNORMAL HIGH (ref 70–99)
POTASSIUM: 4.4 meq/L (ref 3.5–5.1)
Sodium: 134 mEq/L — ABNORMAL LOW (ref 135–145)
Total Bilirubin: 0.3 mg/dL (ref 0.2–1.2)
Total Protein: 7.5 g/dL (ref 6.0–8.3)

## 2016-01-13 LAB — LIPASE: LIPASE: 29 U/L (ref 11.0–59.0)

## 2016-01-13 MED ORDER — SUCRALFATE 1 G PO TABS: 1.0000 g | ORAL_TABLET | Freq: Three times a day (TID) | ORAL | 2 refills | Status: DC

## 2016-01-13 MED ORDER — PANTOPRAZOLE SODIUM 40 MG PO TBEC: 40.0000 mg | DELAYED_RELEASE_TABLET | Freq: Every day | ORAL | 2 refills | Status: DC

## 2016-01-13 NOTE — Patient Instructions (Signed)
Your physician has requested that you go to the basement for the following lab work before leaving today: CBC, CMP, Lipase, GI Pathogen  We have sent the following medications to your pharmacy for you to pick up at your convenience: Pantoprazole 40 mg daily Carafate 1 grams before meals and at bedtime  You have been scheduled for a CT scan of the abdomen and pelvis at [facility] ([address]---this is in the same building as [facility]).   You are scheduled on Monday, 01/20/16 at 10:30 am. You should arrive 15 minutes prior to your appointment time for registration. Please follow the written instructions below on the day of your exam:  WARNING: IF YOU ARE ALLERGIC TO IODINE/X-RAY DYE, PLEASE NOTIFY RADIOLOGY IMMEDIATELY AT [phone]! YOU WILL BE GIVEN A 13 HOUR PREMEDICATION PREP.  1) Do not eat or drink anything after 6:30 am (4 hours prior to your test) 2) You have been given 2 bottles of oral contrast to drink. The solution may taste better if refrigerated, but do NOT add ice or any other liquid to this solution. Shake well before drinking.    Drink 1 bottle of contrast @ 8:30 am (2 hours prior to your exam)  Drink 1 bottle of contrast @ 9:30 am (1 hour prior to your exam)  You may take any medications as prescribed with a small amount of water except for the following: Metformin, Glucophage, Glucovance, Avandamet, Riomet, Fortamet, Actoplus Met, Janumet, Glumetza or Metaglip. The above medications must be held the day of the exam AND 48 hours after the exam.  The purpose of you drinking the oral contrast is to aid in the visualization of your intestinal tract. The contrast solution may cause some diarrhea. Before your exam is started, you will be given a small amount of fluid to drink. Depending on your individual set of symptoms, you may also receive an intravenous injection of x-ray contrast/dye. Plan on being at [facility] for 30 minutes or longer,  depending on the type of exam you are having performed.  This test typically takes 30-45 minutes to complete.  If you have any questions regarding your exam or if you need to reschedule, you may call the CT department at [phone] between the hours of 8:00 am and 5:00 pm, Monday-Friday.  ________________________________________________________________________  CC: Dr Joylene Draft  If you are age 42 or older, your body mass index should be between 23-30. Your Body mass index is 22.14 kg/m. If this is out of the aforementioned range listed, please consider follow up with your Primary Care Provider.  If you are age 48 or younger, your body mass index should be between 19-25. Your Body mass index is 22.14 kg/m. If this is out of the aformentioned range listed, please consider follow up with your Primary Care Provider.

## 2016-01-13 NOTE — Telephone Encounter (Signed)
Pt states she is not feeling any better. Pt scheduled to see Dr. Hilarie Fredrickson today at [place]. Pt aware of appt.
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
## 2016-01-15 ENCOUNTER — Other Ambulatory Visit: Payer: Medicare Other

## 2016-01-15 DIAGNOSIS — R195 Other fecal abnormalities: Secondary | ICD-10-CM

## 2016-01-15 DIAGNOSIS — R194 Change in bowel habit: Secondary | ICD-10-CM

## 2016-01-15 DIAGNOSIS — R1013 Epigastric pain: Secondary | ICD-10-CM

## 2016-01-16 LAB — GASTROINTESTINAL PATHOGEN PANEL PCR
C. DIFFICILE TOX A/B, PCR: NOT DETECTED
CAMPYLOBACTER, PCR: NOT DETECTED
CRYPTOSPORIDIUM, PCR: NOT DETECTED
E coli (ETEC) LT/ST PCR: NOT DETECTED
E coli (STEC) stx1/stx2, PCR: NOT DETECTED
E coli 0157, PCR: NOT DETECTED
GIARDIA LAMBLIA, PCR: NOT DETECTED
NOROVIRUS, PCR: NOT DETECTED
ROTAVIRUS, PCR: NOT DETECTED
Salmonella, PCR: NOT DETECTED
Shigella, PCR: NOT DETECTED

## 2016-01-20 ENCOUNTER — Ambulatory Visit (INDEPENDENT_AMBULATORY_CARE_PROVIDER_SITE_OTHER)
Admission: RE | Admit: 2016-01-20 | Discharge: 2016-01-20 | Disposition: A | Discharge status: Home / Self Care | Payer: Medicare Other | Source: Ambulatory Visit | Attending: Internal Medicine | Admitting: Internal Medicine

## 2016-01-20 DIAGNOSIS — R194 Change in bowel habit: Secondary | ICD-10-CM

## 2016-01-20 DIAGNOSIS — R1013 Epigastric pain: Secondary | ICD-10-CM | POA: Diagnosis not present

## 2016-01-20 DIAGNOSIS — R195 Other fecal abnormalities: Secondary | ICD-10-CM | POA: Diagnosis not present

## 2016-01-20 MED ORDER — IOPAMIDOL (ISOVUE-300) INJECTION 61%
100.0000 mL | Freq: Once | INTRAVENOUS | Status: AC | PRN
  Administered 2016-01-20: 100 mL via INTRAVENOUS

## 2016-01-21 ENCOUNTER — Telehealth: Payer: Self-pay | Admitting: Internal Medicine

## 2016-01-21 NOTE — Telephone Encounter (Signed)
Pt calling for CT results, please advise. °

## 2016-01-22 NOTE — Telephone Encounter (Signed)
See result note.  

## 2016-03-16 ENCOUNTER — Other Ambulatory Visit: Payer: Self-pay | Admitting: *Deleted

## 2016-03-16 MED ORDER — SUCRALFATE 1 G PO TABS: 1.0000 g | ORAL_TABLET | Freq: Three times a day (TID) | ORAL | 0 refills | Status: DC

## 2016-03-16 NOTE — Telephone Encounter (Signed)
Rx sent 

## 2016-05-14 ENCOUNTER — Other Ambulatory Visit: Payer: Self-pay | Admitting: Internal Medicine

## 2016-06-30 ENCOUNTER — Emergency Department (HOSPITAL_COMMUNITY): Payer: Medicare Other

## 2016-06-30 ENCOUNTER — Observation Stay (HOSPITAL_COMMUNITY): Payer: Medicare Other

## 2016-06-30 ENCOUNTER — Encounter (HOSPITAL_COMMUNITY): Payer: Self-pay | Admitting: Emergency Medicine

## 2016-06-30 ENCOUNTER — Observation Stay (HOSPITAL_COMMUNITY)
Admission: EM | Admit: 2016-06-30 | Discharge: 2016-07-01 | Disposition: A | Discharge status: Home / Self Care | Payer: Medicare Other | Attending: Family Medicine | Admitting: Family Medicine

## 2016-06-30 DIAGNOSIS — R739 Hyperglycemia, unspecified: Secondary | ICD-10-CM

## 2016-06-30 DIAGNOSIS — R2681 Unsteadiness on feet: Secondary | ICD-10-CM | POA: Diagnosis not present

## 2016-06-30 DIAGNOSIS — E119 Type 2 diabetes mellitus without complications: Secondary | ICD-10-CM | POA: Insufficient documentation

## 2016-06-30 DIAGNOSIS — E039 Hypothyroidism, unspecified: Secondary | ICD-10-CM | POA: Diagnosis not present

## 2016-06-30 DIAGNOSIS — Z79899 Other long term (current) drug therapy: Secondary | ICD-10-CM | POA: Insufficient documentation

## 2016-06-30 DIAGNOSIS — Z7982 Long term (current) use of aspirin: Secondary | ICD-10-CM | POA: Insufficient documentation

## 2016-06-30 DIAGNOSIS — E782 Mixed hyperlipidemia: Secondary | ICD-10-CM | POA: Diagnosis present

## 2016-06-30 DIAGNOSIS — R55 Syncope and collapse: Principal | ICD-10-CM | POA: Insufficient documentation

## 2016-06-30 DIAGNOSIS — I10 Essential (primary) hypertension: Secondary | ICD-10-CM | POA: Insufficient documentation

## 2016-06-30 DIAGNOSIS — E785 Hyperlipidemia, unspecified: Secondary | ICD-10-CM | POA: Diagnosis present

## 2016-06-30 DIAGNOSIS — I2510 Atherosclerotic heart disease of native coronary artery without angina pectoris: Secondary | ICD-10-CM | POA: Insufficient documentation

## 2016-06-30 DIAGNOSIS — G629 Polyneuropathy, unspecified: Secondary | ICD-10-CM

## 2016-06-30 LAB — PHOSPHORUS: PHOSPHORUS: 3.7 mg/dL (ref 2.5–4.6)

## 2016-06-30 LAB — URINALYSIS, ROUTINE W REFLEX MICROSCOPIC
Bilirubin Urine: NEGATIVE
GLUCOSE, UA: NEGATIVE mg/dL
Hgb urine dipstick: NEGATIVE
KETONES UR: NEGATIVE mg/dL
Nitrite: POSITIVE — AB
PROTEIN: NEGATIVE mg/dL
Specific Gravity, Urine: 1.006 (ref 1.005–1.030)
pH: 6 (ref 5.0–8.0)

## 2016-06-30 LAB — MAGNESIUM: MAGNESIUM: 1.9 mg/dL (ref 1.7–2.4)

## 2016-06-30 LAB — CBC
HEMATOCRIT: 39.7 % (ref 36.0–46.0)
HEMOGLOBIN: 13.4 g/dL (ref 12.0–15.0)
MCH: 30.9 pg (ref 26.0–34.0)
MCHC: 33.8 g/dL (ref 30.0–36.0)
MCV: 91.7 fL (ref 78.0–100.0)
Platelets: 189 10*3/uL (ref 150–400)
RBC: 4.33 MIL/uL (ref 3.87–5.11)
RDW: 13.3 % (ref 11.5–15.5)
WBC: 7.5 10*3/uL (ref 4.0–10.5)

## 2016-06-30 LAB — BASIC METABOLIC PANEL
ANION GAP: 6 (ref 5–15)
BUN: 15 mg/dL (ref 6–20)
CO2: 26 mmol/L (ref 22–32)
Calcium: 9.1 mg/dL (ref 8.9–10.3)
Chloride: 103 mmol/L (ref 101–111)
Creatinine, Ser: 0.67 mg/dL (ref 0.44–1.00)
GFR calc Af Amer: >60 mL/min (ref >60)
GLUCOSE: 187 mg/dL — AB (ref 65–99)
POTASSIUM: 3.7 mmol/L (ref 3.5–5.1)
Sodium: 135 mmol/L (ref 135–145)

## 2016-06-30 LAB — TSH: TSH: 0.854 u[IU]/mL (ref 0.350–4.500)

## 2016-06-30 LAB — TROPONIN I

## 2016-06-30 MED ORDER — SENNOSIDES-DOCUSATE SODIUM 8.6-50 MG PO TABS: 1.0000 | ORAL_TABLET | Freq: Every evening | ORAL | Status: DC | PRN

## 2016-06-30 MED ORDER — NITROGLYCERIN 0.4 MG SL SUBL
0.4000 mg | SUBLINGUAL_TABLET | SUBLINGUAL | Status: DC | PRN
Start: 2016-06-30 — End: 2016-07-01

## 2016-06-30 MED ORDER — ACETAMINOPHEN 325 MG PO TABS: 650.0000 mg | ORAL_TABLET | Freq: Four times a day (QID) | ORAL | Status: DC | PRN

## 2016-06-30 MED ORDER — ONDANSETRON HCL 4 MG PO TABS: 4.0000 mg | ORAL_TABLET | Freq: Four times a day (QID) | ORAL | Status: DC | PRN

## 2016-06-30 MED ORDER — EZETIMIBE 10 MG PO TABS
10.0000 mg | ORAL_TABLET | Freq: Every day | ORAL | Status: DC
  Administered 2016-07-01: 10 mg via ORAL
  Filled 2016-06-30: qty 1

## 2016-06-30 MED ORDER — ASPIRIN 325 MG PO TABS
325.0000 mg | ORAL_TABLET | ORAL | Status: DC
  Administered 2016-07-01: 325 mg via ORAL
  Filled 2016-06-30: qty 1

## 2016-06-30 MED ORDER — OMEGA-3-ACID ETHYL ESTERS 1 G PO CAPS
2.0000 g | ORAL_CAPSULE | Freq: Every day | ORAL | Status: DC
  Administered 2016-07-01: 2 g via ORAL
  Filled 2016-06-30: qty 2

## 2016-06-30 MED ORDER — SODIUM CHLORIDE 0.9 % IV SOLN
INTRAVENOUS | Status: DC
  Administered 2016-06-30 – 2016-07-01 (×2): via INTRAVENOUS

## 2016-06-30 MED ORDER — GABAPENTIN 100 MG PO CAPS
100.0000 mg | ORAL_CAPSULE | Freq: Three times a day (TID) | ORAL | Status: DC
  Administered 2016-06-30 – 2016-07-01 (×3): 100 mg via ORAL
  Filled 2016-06-30 (×3): qty 1

## 2016-06-30 MED ORDER — SODIUM CHLORIDE 0.9% FLUSH: 3.0000 mL | Freq: Two times a day (BID) | INTRAVENOUS | Status: DC

## 2016-06-30 MED ORDER — ENOXAPARIN SODIUM 40 MG/0.4ML ~~LOC~~ SOLN
40.0000 mg | SUBCUTANEOUS | Status: DC
  Administered 2016-06-30: 40 mg via SUBCUTANEOUS
  Filled 2016-06-30: qty 0.4

## 2016-06-30 MED ORDER — ONDANSETRON HCL 4 MG/2ML IJ SOLN: 4.0000 mg | Freq: Four times a day (QID) | INTRAMUSCULAR | Status: DC | PRN

## 2016-06-30 MED ORDER — LEVOTHYROXINE SODIUM 50 MCG PO TABS
50.0000 ug | ORAL_TABLET | Freq: Every day | ORAL | Status: DC
  Administered 2016-07-01: 50 ug via ORAL
  Filled 2016-06-30: qty 1

## 2016-06-30 MED ORDER — ACETAMINOPHEN 650 MG RE SUPP: 650.0000 mg | Freq: Four times a day (QID) | RECTAL | Status: DC | PRN

## 2016-06-30 MED ORDER — FOLIC ACID 1 MG PO TABS
1.0000 mg | ORAL_TABLET | Freq: Every day | ORAL | Status: DC
  Administered 2016-07-01: 1 mg via ORAL
  Filled 2016-06-30: qty 1

## 2016-06-30 MED ORDER — HYDROCODONE-ACETAMINOPHEN 5-325 MG PO TABS: 1.0000 | ORAL_TABLET | Freq: Four times a day (QID) | ORAL | Status: DC | PRN

## 2016-06-30 MED ORDER — OMEGA-3 FATTY ACIDS 1000 MG PO CAPS: 2.0000 g | ORAL_CAPSULE | Freq: Every day | ORAL | Status: DC

## 2016-06-30 MED ORDER — LORAZEPAM 1 MG PO TABS
2.0000 mg | ORAL_TABLET | Freq: Every day | ORAL | Status: DC
  Administered 2016-06-30: 2 mg via ORAL
  Filled 2016-06-30: qty 2

## 2016-06-30 NOTE — ED Notes (Signed)
WILL TRANSPORT PT TO 1 W 1102-1 AAOX4. PT IN NO APPARENT DISTRESS OR PAIN. THE OPPORTUNITY TO ASK QUESTIONS WAS PROVIDED.

## 2016-06-30 NOTE — ED Provider Notes (Signed)
[facility] DEPT Provider Note   CSN: [id number] Arrival date & time: 06/30/16  1044     History   Chief Complaint Chief Complaint  Patient presents with  . Loss of Consciousness    HPI Christina [name] is a 82 y.o. female.  HPI Patient presents after syncopal episode. She was at the beauty [employer] today when she said she began to feel tired. She then fell asleep or passed out. States she woke up to EMS there. No chest pain currently but does have occasional chest pain on her left chest that she's been having for years. No swelling or legs. She's been doing well the last couple days. She's had a good appetite. No nausea vomiting. No headache. Confusion. States she woke up quickly. No loss of bladder or bowel control. No reported seizure activity.   Past Medical History:  Diagnosis Date  . Anemia, unspecified   . Cataract   . Chest pain   . Coronary atherosclerosis of native coronary artery   . Depressive disorder, not elsewhere classified   . Diabetes mellitus   . Disorder of bone and cartilage, unspecified   . Diverticulosis of colon (without mention of hemorrhage)   . External hemorrhoids without mention of complication   . Family history of malignant neoplasm of gastrointestinal tract   . H/O: hysterectomy   . History of back surgery 2013    spinal stenosis 7 31 [place]  . Hx of dislocation of shoulder    right  . Hyperlipidemia    134 12-2013 per pt. on meds  . Hypertension   . Insomnia, unspecified   . Lumbago   . Personal history of colonic polyps   . Unspecified adverse effect of other drug, medicinal and biological substance(995.29)   . Unspecified hypothyroidism   . Unspecified vitamin D deficiency   . Urinary frequency   . Urinary tract infection, site not specified     Patient Active Problem List   Diagnosis Date Noted  . Syncope 06/30/2016  . Medication management 07/03/2013  . Hypothyroidism 04/23/2013  . Hyperlipidemia 04/23/2013  . Urgency  of urination 04/23/2013  . Hypertension 04/23/2013  . Sinusitis 03/01/2013  . Early cataract 05/07/2012  . Neuropathy ([place]) 05/06/2012  . Radicular pain of both lower extremities 05/06/2012  . Lumbar spinal stenosis 10/20/2011  . Hyperglycemia 05/24/2011  . Long term current use of opiate analgesic 05/24/2011  . 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 Hx of dislocation of shoulder   . H/O: hysterectomy   . VITAMIN D DEFICIENCY 10/03/2009  . CAD, NATIVE VESSEL 05/01/2009  . CHEST PAIN, EXERTIONAL 05/01/2009  . LOW BACK PAIN SYNDROME 11/27/2008  . ANEMIA 09/25/2008  . DEPRESSION 09/25/2008  . UNS ADVRS EFF OTH RX MEDICINAL&BIOLOGICAL SBSTNC 09/25/2008  . OSTEOPENIA 09/22/2007  . INSOMNIA 09/22/2007  . DIVERTICULOSIS OF COLON 09/06/2007  . COLONIC POLYPS, ADENOMATOUS, HX OF 09/06/2007  . HYPERLIPIDEMIA 05/10/2007  . HYPOTHYROIDISM 09/08/2006  . HYPERTENSION 09/08/2006    Past Surgical History:  Procedure Laterality Date  . ABDOMINAL HYSTERECTOMY    . APPENDECTOMY     1961  . BACK SURGERY     2013   . CATARACT EXTRACTION Right 02-17-2014  . CHOLECYSTECTOMY     2006  . COLONOSCOPY    . HEMORRHOID SURGERY    . POLYPECTOMY    . [place]    OB History    No data available  Home Medications    Prior to Admission medications   Medication Sig Start Date End Date Taking? Authorizing Provider  aspirin 325 MG tablet Take 325 mg by mouth 3 (three) times a week.    Yes Historical Provider, MD  bisoprolol-hydrochlorothiazide (ZIAC) 2.5-6.25 MG tablet Take 1 tablet by mouth daily.    Yes Historical Provider, MD  CALCIUM-VITAMIN D PO Take 1 tablet by mouth daily.    Yes Historical Provider, MD  Cholecalciferol (VITAMIN D PO) Take 1 tablet by mouth daily.    Yes Historical Provider, MD  Cyanocobalamin (VITAMIN B 12 PO) Take 1 tablet by mouth daily.    Yes Historical Provider, MD  ezetimibe (ZETIA) 10 MG tablet Take 10 mg by  mouth daily. 10/19/15  Yes Historical Provider, MD  fish oil-omega-3 fatty acids 1000 MG capsule Take 2 g by mouth daily.     Yes Historical Provider, MD  folic acid (FOLVITE) 1 MG tablet TAKE 1 TABLET BY MOUTH DAILY 12/02/10  Yes Hoover Browns., MD  gabapentin (NEURONTIN) 100 MG capsule Take 100 mg by mouth 3 (three) times daily.   Yes Historical Provider, MD  HYDROcodone-acetaminophen (NORCO) 5-325 MG tablet Take 1 tablet by mouth every 6 (six) hours as needed for moderate pain or severe pain. 09/10/15  Yes Sam Winfred Leeds, MD  irbesartan (AVAPRO) 150 MG tablet Take 150 mg by mouth daily. 11/04/15  Yes Historical Provider, MD  levothyroxine (SYNTHROID, LEVOTHROID) 50 MCG tablet TAKE 1 TABLET BY MOUTH EVERY DAY   Yes Burnis Medin, MD  LORazepam (ATIVAN) 2 MG tablet Take 2 mg by mouth at bedtime.   Yes Historical Provider, MD  pregabalin (LYRICA) 75 MG capsule Take 75 mg by mouth daily.    Yes Historical Provider, MD  LORazepam (ATIVAN) 1 MG tablet Take 1 tablet (1 mg total) by mouth at bedtime. Patient not taking: Reported on 06/30/2016 07/03/13   Burnis Medin, MD  nitroGLYCERIN (NITROSTAT) 0.4 MG SL tablet Place 1 tablet (0.4 mg total) under the tongue every 5 (five) minutes as needed for chest pain. 03/28/12   Renella Cunas, MD  pantoprazole (PROTONIX) 40 MG tablet Take 1 tablet (40 mg total) by mouth daily. Patient not taking: Reported on 06/30/2016 01/13/16   Jerene Bears, MD  sucralfate (CARAFATE) 1 g tablet TAKE 1 TABLET BY MOUTH 4  TIMES DAILY WITH MEALS AND  AT BEDTIME. Patient not taking: Reported on 06/30/2016 05/14/16   Jerene Bears, MD    Family History Family History  Problem Relation Age of Onset  . Heart disease Mother     died age 69 had dm  . Kidney disease Mother   . Diabetes Mother   . Diabetes Sister   . Diabetes Brother   . Heart disease Brother   . Colon cancer Brother   . Colon cancer Other     nephew  . Colon cancer Other     nephew  . Rectal cancer Other    . Stomach cancer Maternal Grandmother     thinks it was stomach cancer  . Esophageal cancer Neg Hx     Social History Social History  Substance Use Topics  . Smoking status: Never Smoker  . Smokeless tobacco: Never Used  . Alcohol use 4.2 oz/week    7 Glasses of wine per week     Comment: 1 glass of wine daily     Allergies   Codeine and Niacin   Review of Systems Review  of Systems  Constitutional: Negative for activity change, appetite change and fever.  HENT: Negative for congestion.   Eyes: Negative for pain.  Respiratory: Negative for chest tightness and shortness of breath.   Cardiovascular: Positive for chest pain. Negative for leg swelling.  Gastrointestinal: Negative for abdominal pain, diarrhea, nausea and vomiting.  Genitourinary: Negative for flank pain.  Musculoskeletal: Negative for back pain and neck stiffness.  Skin: Negative for rash.  Neurological: Positive for syncope. Negative for weakness, numbness and headaches.  Psychiatric/Behavioral: Negative for behavioral problems.     Physical Exam Updated Vital Signs BP 118/86   Pulse 66   Temp 98.1 F (36.7 C) (Oral)   Resp 17   Ht 5\' 3"  (1.6 m)   Wt 130 lb (59 kg)   SpO2 100%   BMI 23.03 kg/m   Physical Exam  Constitutional: She is oriented to person, place, and time. She appears well-developed and well-nourished.  HENT:  Head: Normocephalic and atraumatic.  Eyes: Pupils are equal, round, and reactive to light.  Neck: Neck supple.  Cardiovascular: Normal rate and regular rhythm.   No murmur heard. Pulmonary/Chest: No respiratory distress. She has no wheezes. She has no rales.  Abdominal: Soft. There is no tenderness.  Musculoskeletal: She exhibits no edema.  Neurological: She is alert and oriented to person, place, and time. No cranial nerve deficit.  Skin: Skin is warm and dry.  Psychiatric: She has a normal mood and affect. Her speech is normal.  Nursing note and vitals  reviewed.    ED Treatments / Results  Labs (all labs ordered are listed, but only abnormal results are displayed) Labs Reviewed  BASIC METABOLIC PANEL - Abnormal; Notable for the following:       Result Value   Glucose, Bld 187 (*)    All other components within normal limits  URINALYSIS, ROUTINE W REFLEX MICROSCOPIC - Abnormal; Notable for the following:    APPearance HAZY (*)    Nitrite POSITIVE (*)    Leukocytes, UA SMALL (*)    Bacteria, UA RARE (*)    Squamous Epithelial / LPF 0-5 (*)    All other components within normal limits  CBC  TROPONIN I    EKG  EKG Interpretation  Date/Time:  Tuesday June 30 2016 11:05:15 EDT Ventricular Rate:  63 PR Interval:    QRS Duration: 90 QT Interval:  401 QTC Calculation: 411 R Axis:   -23 Text Interpretation:  Sinus rhythm Probable left atrial enlargement Abnormal R-wave progression, early transition Probable left ventricular hypertrophy Inferior infarct, old Anterior Q waves, possibly due to LVH Confirmed by Alvino Chapel  MD, Millan Legan [phone]) on 06/30/2016 12:12:51 PM       Radiology Dg Chest 2 View  Result Date: 06/30/2016 CLINICAL DATA:  Syncope EXAM: CHEST  2 VIEW COMPARISON:  09/10/2015 FINDINGS: Moderate-sized hiatal hernia. Mild elevation of the right hemidiaphragm. Lungs are clear. Heart is normal size. No effusions or acute bony abnormality. IMPRESSION: Moderate-sized hiatal hernia. Stable elevation of the right hemidiaphragm. No active disease. Electronically Signed   By: Rolm Baptise M.D.   On: 06/30/2016 11:55    Procedures Procedures (including critical care time)  Medications Ordered in ED Medications - No data to display   Initial Impression / Assessment and Plan / ED Course  I have reviewed the triage vital signs and the nursing notes.  Pertinent labs & imaging results that were available during my care of the patient were reviewed by me and considered in my medical  decision making (see chart for details).      Patient was syncope. 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 States she is feeling well otherwise. Was poorly out for around 5 minutes. Nose seen seizure activity. Has known mild nonobstructive coronary disease. Labs reassuring. Will admit to internal medicine.  Final Clinical Impressions(s) / ED Diagnoses   Final diagnoses:  Syncope, unspecified syncope type    New Prescriptions New Prescriptions   No medications on file     Davonna Belling, MD 06/30/16 1436

## 2016-06-30 NOTE — ED Notes (Signed)
FIRST ATTEMPT TO CALL REPORT TO 1W 1102-1.

## 2016-06-30 NOTE — H&P (Signed)
History and Physical    Christina [name] WGY:[id number] DOB: 1935/01/01 DOA: 06/30/2016  PCP: Jerlyn Ly, MD   Patient coming from: Hair Dresser  Chief Complaint: Passed Out   HPI: Christina [name] is a high functioning 82 y.o. female with medical history significant of HLD, HTN, Hx of Nonobstructive CAD, Hypothyroidism, Vitamin D deficiency and other comorbids including Neuropathy, GERD, Axexity Chronic Back Pain, and Grade 1 Diastolic Dysfunction who was at her hairdresser getting her hair curled and passed out. Patient states that this AM she was in her usual state of Health this AM when she went swimming to the [facility]. States she went to the Hair dresser at 9-10 am. Patient states she was sitting there getting her hair rolled and she started to "feeling werid" and "grey" and thinks the the whole room was getting "grey." She states she must have passed out because that's all she remembered. Patient states witnesses told her she was out for 3-5 minutes and unresponsive when they tried to arouse her so they called EMS. When patient awoke she had no recollection and confusion. No tongue biting. Patient states she is concerned her memory is worsening. No nausea, CP, SOB, lightheadedness or dizziness associated with symptoms.   ED Course: Was brought in had basic bloodwork done, CXR and EKG; TRH called to Admit for Syncope  Review of Systems: As per HPI otherwise 10 point review of systems negative.   Past Medical History:  Diagnosis Date  . Anemia, unspecified   . Cataract   . Chest pain   . Coronary atherosclerosis of native coronary artery   . Depressive disorder, not elsewhere classified   . Diabetes mellitus   . Disorder of bone and cartilage, unspecified   . Diverticulosis of colon (without mention of hemorrhage)   . External hemorrhoids without mention of complication   . Family history of malignant neoplasm of gastrointestinal tract   . H/O: hysterectomy   . History of back surgery  2013    spinal stenosis 7 31 [place]  . Hx of dislocation of shoulder    right  . Hyperlipidemia    134 12-2013 per pt. on meds  . Hypertension   . Insomnia, unspecified   . Lumbago   . Personal history of colonic polyps   . Unspecified adverse effect of other drug, medicinal and biological substance(995.29)   . Unspecified hypothyroidism   . Unspecified vitamin D deficiency   . Urinary frequency   . Urinary tract infection, site not specified    Past Surgical History:  Procedure Laterality Date  . ABDOMINAL HYSTERECTOMY    . APPENDECTOMY     1961  . BACK SURGERY     2013   . CATARACT EXTRACTION Right 02-17-2014  . CHOLECYSTECTOMY     2006  . COLONOSCOPY    . HEMORRHOID SURGERY    . POLYPECTOMY    . [place]  reports that she has never smoked. She has never used smokeless tobacco. She reports that she drinks about 4.2 oz of alcohol per week . She reports that she does not use drugs.  Allergies  Allergen Reactions  . Codeine     itching  . Niacin     Skin flushed   Family History  Problem Relation Age of Onset  . Heart disease Mother     died age 79 had dm  . Kidney disease Mother   . Diabetes  Mother   . Diabetes Sister   . Diabetes Brother   . Heart disease Brother   . Colon cancer Brother   . Colon cancer Other     nephew  . Colon cancer Other     nephew  . Rectal cancer Other   . Stomach cancer Maternal Grandmother     thinks it was stomach cancer  . Esophageal cancer Neg Hx    Prior to Admission medications   Medication Sig Start Date End Date Taking? Authorizing Provider  aspirin 325 MG tablet Take 325 mg by mouth 3 (three) times a week.    Yes Historical Provider, MD  bisoprolol-hydrochlorothiazide (ZIAC) 2.5-6.25 MG tablet Take 1 tablet by mouth daily.    Yes Historical Provider, MD  CALCIUM-VITAMIN D PO Take 1 tablet by mouth daily.    Yes Historical Provider, MD  Cholecalciferol (VITAMIN D PO)  Take 1 tablet by mouth daily.    Yes Historical Provider, MD  Cyanocobalamin (VITAMIN B 12 PO) Take 1 tablet by mouth daily.    Yes Historical Provider, MD  ezetimibe (ZETIA) 10 MG tablet Take 10 mg by mouth daily. 10/19/15  Yes Historical Provider, MD  fish oil-omega-3 fatty acids 1000 MG capsule Take 2 g by mouth daily.     Yes Historical Provider, MD  folic acid (FOLVITE) 1 MG tablet TAKE 1 TABLET BY MOUTH DAILY 12/02/10  Yes Hoover Browns., MD  gabapentin (NEURONTIN) 100 MG capsule Take 100 mg by mouth 3 (three) times daily.   Yes Historical Provider, MD  HYDROcodone-acetaminophen (NORCO) 5-325 MG tablet Take 1 tablet by mouth every 6 (six) hours as needed for moderate pain or severe pain. 09/10/15  Yes Sam Winfred Leeds, MD  irbesartan (AVAPRO) 150 MG tablet Take 150 mg by mouth daily. 11/04/15  Yes Historical Provider, MD  levothyroxine (SYNTHROID, LEVOTHROID) 50 MCG tablet TAKE 1 TABLET BY MOUTH EVERY DAY   Yes Burnis Medin, MD  LORazepam (ATIVAN) 2 MG tablet Take 2 mg by mouth at bedtime.   Yes Historical Provider, MD  pregabalin (LYRICA) 75 MG capsule Take 75 mg by mouth daily.    Yes Historical Provider, MD  LORazepam (ATIVAN) 1 MG tablet Take 1 tablet (1 mg total) by mouth at bedtime. Patient not taking: Reported on 06/30/2016 07/03/13   Burnis Medin, MD  nitroGLYCERIN (NITROSTAT) 0.4 MG SL tablet Place 1 tablet (0.4 mg total) under the tongue every 5 (five) minutes as needed for chest pain. 03/28/12   Renella Cunas, MD  pantoprazole (PROTONIX) 40 MG tablet Take 1 tablet (40 mg total) by mouth daily. Patient not taking: Reported on 06/30/2016 01/13/16   Jerene Bears, MD  sucralfate (CARAFATE) 1 g tablet TAKE 1 TABLET BY MOUTH 4  TIMES DAILY WITH MEALS AND  AT BEDTIME. Patient not taking: Reported on 06/30/2016 05/14/16   Jerene Bears, MD   Physical Exam: Vitals:   06/30/16 1059 06/30/16 1241 06/30/16 1330 06/30/16 1430  BP: 127/81  100/61 118/86  Pulse: 62  (!) 55 66  Resp: 16  14  17   Temp: 98.1 F (36.7 C)     TempSrc: Oral     SpO2: 98%  96% 100%  Weight: 59 kg (130 lb) 59 kg (130 lb)    Height:  5\' 3"  (1.6 m)     Constitutional:  NAD and appears calm and comfortable Eyes: Lids and conjunctivae normal, sclerae anicteric  ENMT: External Ears, Nose appear normal. Grossly normal hearing.  Neck: Appears normal, supple, no cervical masses, normal ROM, no appreciable thyromegaly, no JVD Respiratory: Clear to auscultation bilaterally, no wheezing, rales, rhonchi or crackles. Normal respiratory effort and patient is not tachypenic. No accessory muscle use.  Cardiovascular: RRR but on the slower side, no murmurs / rubs / gallops. S1 and S2 auscultated. No extremity edema. Abdomen: Soft, non-tender, non-distended. No masses palpated. No appreciable hepatosplenomegaly. Bowel sounds positive x4.  GU: Deferred. Musculoskeletal: No clubbing / cyanosis of digits/nails. No joint deformity upper and lower extremities.  Skin: No rashes, lesions, ulcers. No induration; Warm and dry.  Neurologic: CN 2-12 grossly intact with no focal deficits. Romberg sign cerebellar reflexes not assessed.  Psychiatric: Normal judgment and insight. Alert and oriented x 3. Normal mood and appropriate affect.   Labs on Admission: I have personally reviewed following labs and imaging studies  CBC:  Recent Labs Lab 06/30/16 1121  WBC 7.5  HGB 13.4  HCT 39.7  MCV 91.7  PLT 092   Basic Metabolic Panel:  Recent Labs Lab 06/30/16 1121  NA 135  K 3.7  CL 103  CO2 26  GLUCOSE 187*  BUN 15  CREATININE 0.67  CALCIUM 9.1   GFR: Estimated Creatinine Clearance: 46.4 mL/min (by C-G formula based on SCr of 0.67 mg/dL). Liver Function Tests: No results for input(s): AST, ALT, ALKPHOS, BILITOT, PROT, ALBUMIN in the last 168 hours. No results for input(s): LIPASE, AMYLASE in the last 168 hours. No results for input(s): AMMONIA in the last 168 hours. Coagulation Profile: No results for  input(s): INR, PROTIME in the last 168 hours. Cardiac Enzymes:  Recent Labs Lab 06/30/16 1121  TROPONINI <0.03   BNP (last 3 results) No results for input(s): PROBNP in the last 8760 hours. HbA1C: No results for input(s): HGBA1C in the last 72 hours. CBG: No results for input(s): GLUCAP in the last 168 hours. Lipid Profile: No results for input(s): CHOL, HDL, LDLCALC, TRIG, CHOLHDL, LDLDIRECT in the last 72 hours. Thyroid Function Tests: No results for input(s): TSH, T4TOTAL, FREET4, T3FREE, THYROIDAB in the last 72 hours. Anemia Panel: No results for input(s): VITAMINB12, FOLATE, FERRITIN, TIBC, IRON, RETICCTPCT in the last 72 hours. Urine analysis:    Component Value Date/Time   COLORURINE YELLOW 06/30/2016 1242   APPEARANCEUR HAZY (A) 06/30/2016 1242   LABSPEC 1.006 06/30/2016 1242   PHURINE 6.0 06/30/2016 1242   GLUCOSEU NEGATIVE 06/30/2016 1242   HGBUR NEGATIVE 06/30/2016 1242   HGBUR trace-lysed 10/03/2009 0832   BILIRUBINUR NEGATIVE 06/30/2016 1242   BILIRUBINUR negative 04/17/2013 1317   BILIRUBINUR small 02/20/2012 1055   KETONESUR NEGATIVE 06/30/2016 1242   PROTEINUR NEGATIVE 06/30/2016 1242   UROBILINOGEN 0.2 04/17/2013 1317   UROBILINOGEN 0.2 10/03/2009 0832   NITRITE POSITIVE (A) 06/30/2016 1242   LEUKOCYTESUR SMALL (A) 06/30/2016 1242   Sepsis Labs: !!!!!!!!!!!!!!!!!!!!!!!!!!!!!!!!!!!!!!!!!!!! @LABRCNTIP (procalcitonin:4,lacticidven:4) )No results found for this or any previous visit (from the past 240 hour(s)).   Radiological Exams on Admission: Dg Chest 2 View  Result Date: 06/30/2016 CLINICAL DATA:  Syncope EXAM: CHEST  2 VIEW COMPARISON:  09/10/2015 FINDINGS: Moderate-sized hiatal hernia. Mild elevation of the right hemidiaphragm. Lungs are clear. Heart is normal size. No effusions or acute bony abnormality. IMPRESSION: Moderate-sized hiatal hernia. Stable elevation of the right hemidiaphragm. No active disease. Electronically Signed   By: Rolm Baptise  M.D.   On: 06/30/2016 11:55   EKG: Independently reviewed. Showed Sinus Rhythm at a rate of 63 and likely LVH and poor R Wave progression. No  evidence of ST Elevation or Depression on my interpretation.   Assessment/Plan Active Problems:   Syncope   Syncope of Unclear Etiology suspect Vasovagal however patient was Unresponsive for 3-5 Minutes -Place in Observation Telemetry; Repeat EKG in AM -Cycle Cardiac Troponin I x 3 -HR was 63 on Admission -Check ECHOCardiogram -Check Orthostatics -Obtain Head CT without Contrast -Check TSH, Free T4 -Get PT and OT Evaluation -Repeat CBC and CMP in AM  Hypertension -Held Patient's Ziac; Patient states she is no longer taking that medication -C/w Home Irbesartan once patient is not Orthostatic and worked up for Syncope  Hx of Nonobstructive CAD with Hx of Cardiac Catheterization -C/w NTG 0.4 mg sL, and Zetia -Holding Irbesartan 150 mg po Daily because of recent Syncope and will need evaluation of Orthostatics  Hyperlipidemia -Check Lipid Panel -C/w Zetia 10 mg po Daily and with Omega 3 Fish Oil 2 grams daily  Hypothyroidism -Check TSH and Free T4 -C/w Home Levothyroxine 50 mcg Daily  GERD -No longer taking PPI  Neuropathy -C/w Gabapentin 100 mg TID daily; Per patient she isn't taking any more  Hx of Back Pain from Spinal Stenosis and Lumbago -C/w Hydrocodone-Acetaminophen 5-325 mg po q6hprn  Impaired Fasting Glucose/ ?Hx of Diabetes -Was hyperglycemic on Admission at 187 -Check HbA1c -Last HbA1c was 6.4  Hx of Grade 1 Diastolic Dysfunction not Decompensated -ECHO 10/23/15 showed EF of 55-60% with normal wall motion and no regional wall motion abnormalities. Doppler parameters were consistent with abnormal Left Ventricular Relaxation (Grade 1 Diastolic Dysfunction) -Repeat ECHOCardiogram  DVT prophylaxis: Lovenox sq Code Status: FULL CODE Family Communication:  Disposition Plan: Will Get PT Evaluation; Likely Home in  AM Consults called: None Admission status: Observation Telemetry  [facility], D.O. [facility] Pager [phone]  If 7PM-7AM, please contact night-coverage [email or web address] Password [email or web address]  06/30/2016, 2:40 PM

## 2016-06-30 NOTE — ED Notes (Signed)
Bed: JR93 Expected date:  Expected time:  Means of arrival:  Comments: EMS- 82yo F, syncope

## 2016-06-30 NOTE — ED Triage Notes (Signed)
Pt arrived via EMS from hairdresser. Pt had a syncopal episode while sitting in hair dresser chair. Staff report pt was unresponsive for 3-5 minutes. Pt pale upon EMS arrival. No incontinence or injury noted.  Pt reports that she feels normal at present. No pain. Had a productive cough yesterday, otherwise has felt fine.

## 2016-07-01 ENCOUNTER — Observation Stay (HOSPITAL_BASED_OUTPATIENT_CLINIC_OR_DEPARTMENT_OTHER): Payer: Medicare Other

## 2016-07-01 DIAGNOSIS — I10 Essential (primary) hypertension: Secondary | ICD-10-CM

## 2016-07-01 DIAGNOSIS — I519 Heart disease, unspecified: Secondary | ICD-10-CM

## 2016-07-01 DIAGNOSIS — R55 Syncope and collapse: Secondary | ICD-10-CM | POA: Diagnosis not present

## 2016-07-01 DIAGNOSIS — I2510 Atherosclerotic heart disease of native coronary artery without angina pectoris: Secondary | ICD-10-CM | POA: Diagnosis not present

## 2016-07-01 LAB — COMPREHENSIVE METABOLIC PANEL
ALK PHOS: 66 U/L (ref 38–126)
ALT: 16 U/L (ref 14–54)
AST: 20 U/L (ref 15–41)
Albumin: 3.8 g/dL (ref 3.5–5.0)
Anion gap: 6 (ref 5–15)
BUN: 14 mg/dL (ref 6–20)
CHLORIDE: 106 mmol/L (ref 101–111)
CO2: 27 mmol/L (ref 22–32)
CREATININE: 0.43 mg/dL — AB (ref 0.44–1.00)
Calcium: 8.9 mg/dL (ref 8.9–10.3)
GFR calc Af Amer: >60 mL/min (ref >60)
Glucose, Bld: 104 mg/dL — ABNORMAL HIGH (ref 65–99)
Potassium: 3.6 mmol/L (ref 3.5–5.1)
Sodium: 139 mmol/L (ref 135–145)
Total Bilirubin: 0.8 mg/dL (ref 0.3–1.2)
Total Protein: 6.1 g/dL — ABNORMAL LOW (ref 6.5–8.1)

## 2016-07-01 LAB — LIPID PANEL
CHOL/HDL RATIO: 3.8 ratio
Cholesterol: 174 mg/dL (ref 0–200)
HDL: 46 mg/dL (ref >40)
LDL CALC: 101 mg/dL — AB (ref 0–99)
Triglycerides: 133 mg/dL (ref <150)
VLDL: 27 mg/dL (ref 0–40)

## 2016-07-01 LAB — CBC
HCT: 36.7 % (ref 36.0–46.0)
Hemoglobin: 12.5 g/dL (ref 12.0–15.0)
MCH: 30.9 pg (ref 26.0–34.0)
MCHC: 34.1 g/dL (ref 30.0–36.0)
MCV: 90.8 fL (ref 78.0–100.0)
PLATELETS: 188 10*3/uL (ref 150–400)
RBC: 4.04 MIL/uL (ref 3.87–5.11)
RDW: 13.3 % (ref 11.5–15.5)
WBC: 7.7 10*3/uL (ref 4.0–10.5)

## 2016-07-01 LAB — GLUCOSE, CAPILLARY: Glucose-Capillary: 89 mg/dL (ref 65–99)

## 2016-07-01 LAB — ECHOCARDIOGRAM COMPLETE
Height: 63 in
WEIGHTICAEL: 2119.94 [oz_av]

## 2016-07-01 LAB — TROPONIN I: Troponin I: <0.03 ng/mL (ref <0.03)

## 2016-07-01 NOTE — Evaluation (Signed)
Occupational Therapy Evaluation Patient Details Name: Christina [name] MRN: [id number] DOB: [birth year] Today's Date: 07/01/2016    History of Present Illness Pt admitted through ED after passing out at [place].  Pt with hx of HTN, CAD and neuropathy   Clinical Impression   This 82 year old female was admitted for the above. She is overall supervision for adls and transfers, for safety. No further OT is needed at this time    Follow Up Recommendations  No OT follow up    Equipment Recommendations  None recommended by OT    Recommendations for Other Services       Precautions / Restrictions Precautions Precautions: Fall Restrictions Weight Bearing Restrictions: No      Mobility Bed Mobility Overal bed mobility: Modified Independent             General bed mobility comments: in/out bed unassisted  Transfers Overall transfer level: Modified independent Equipment used: None             General transfer comment: Pt self assisted with UEs     Balance Overall balance assessment: No apparent balance deficits (not formally assessed)                                          ADL Overall ADL's : Needs assistance/impaired                         Toilet Transfer: Supervision/safety;Comfort height toilet;Ambulation   Toileting- Clothing Manipulation and Hygiene: Modified independent         General ADL Comments: pt needs supervision to set herself up for ADLs; no LOB when retrieving items from floor.  Took orthostatic BPs (-) and gave to RN.     Vision         Perception     Praxis      Pertinent Vitals/Pain Pain Assessment: No/denies pain     Hand Dominance     Extremity/Trunk Assessment Upper Extremity Assessment Upper Extremity Assessment: Overall WFLs          Communication Communication Communication: HOH   Cognition Arousal/Alertness: Awake/alert Behavior During Therapy: WFL for tasks  assessed/performed Overall Cognitive Status: Within Functional Limits for tasks assessed                     General Comments       Exercises       Shoulder Instructions      Home Living Family/patient expects to be discharged to:: Private residence Living Arrangements: Alone   Type of Home: House             Bathroom Shower/Tub: Occupational psychologist: Standard     Home Equipment: None          Prior Functioning/Environment Level of Independence: Independent        Comments: [facility] and [facility]        OT Problem List:        OT Treatment/Interventions:      OT Goals(Current goals can be found in the care plan section) Acute Rehab OT Goals Patient Stated Goal: HOME!!  OT Frequency:     Barriers to D/C:            Co-evaluation   Reason for Co-Treatment: For patient/therapist safety PT  goals addressed during session: Mobility/safety with mobility OT goals addressed during session: ADL's and self-care      End of Session Equipment Utilized During Treatment: Gait belt  Activity Tolerance: Patient tolerated treatment well Patient left: in bed;with call bell/phone within reach  OT Visit Diagnosis: Muscle weakness (generalized) (M62.81)                ADL either performed or assessed with clinical judgement  Time: 8478-4128 OT Time Calculation (min): 18 min Charges:  OT General Charges $OT Visit: 1 Procedure OT Evaluation $OT Eval Low Complexity: 1 Procedure G-Codes: OT G-codes **NOT FOR INPATIENT CLASS** Functional Assessment Tool Used: Clinical judgement Functional Limitation: Self care Self Care Current Status (S0813): At least 1 percent but less than 20 percent impaired, limited or restricted Self Care Goal Status (G8719): At least 1 percent but less than 20 percent impaired, limited or restricted Self Care Discharge Status [id number]): At least 1 percent but less than 20 percent  impaired, limited or restricted   Christina [name], OTR/L [phone] 07/01/2016  Christina [name] 07/01/2016, 10:36 AM

## 2016-07-01 NOTE — Care Management Obs Status (Signed)
[facility] NOTIFICATION   Patient Details  Name: Christina [name] MRN: [id number] Date of Birth: Jun 01, 1934   Medicare Observation Status Notification Given:  Yes    Guadalupe Maple, RN 07/01/2016, 3:25 PM

## 2016-07-01 NOTE — Progress Notes (Signed)
  Echocardiogram 2D Echocardiogram has been performed.  Christina [name] 07/01/2016, 1:54 PM

## 2016-07-01 NOTE — Discharge Summary (Signed)
Physician Discharge Summary  AVELYN TOUCH  NOI:[id number]  DOB: 1934-11-13  DOA: 06/30/2016 PCP: Jerlyn Ly, MD  Admit date: 06/30/2016 Discharge date: 07/01/2016  Admitted From: Home  Disposition:  Home   Recommendations for Outpatient Follow-up:  1. 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 Patient was in her usual state of health when swimming in the [facility] and then subsequently went to her beauty salon where she started feeling "weird". Patient reported that with Mrs. told her that she was out for 3-5 minutes and the next thing that she remembers is speaking with EMS. Patient had no prior symptoms like chest pain, shortness of breath, dizziness, or weakness. She was admitted for evaluation of syncope. CT head, echo cardiogram, T11 to monitor, and left workup was done all with negative results. Patient did not have more syncopal episode. Patient was hydrated with IV fluids and discharged home to follow-up with primary care doctor.  Subjective: Patient seen and examined. Have no complaints. Denies dizziness, weakness, numbness, chest pain, SOB, palpitations and blurry vision   Discharge Diagnoses/Hospital Course:  Syncope - 2/2 Vasovagal from dehydration  Patient was placed in observation, heart monitor for > 24hr with no acute findings. ECHO was done which showed no significant changes from prior. CT head no acute changes. Patient with slight increase in BUN, probably dehydrated after working out. Recommend to follow up with PCP in 1-2 week. Will defer further workup to outpatient if deemed necessary   HTN - BP stable   Resume home medications Follow up with PCP   CAD - asymptomatic  No changes in medication - continue home regimen   Grade 1 Diastolic Dysfunction - no signs of fluid overload  Continue to monitor Advised low salt diet  Follow up with PCP   All other chronic medical condition were stable during the hospitalization.  On the day of the discharge the patient's vitals were stable, and no other acute medical condition were reported by patient. Patient was felt safe to be discharge to home.  Discharge Instructions  You were cared for by a hospitalist during your hospital stay. If you have any questions about your discharge medications or the care you received while you were in the hospital after you are discharged, you can call the unit and asked to speak with the hospitalist on call if the hospitalist that took care of you is not available. Once you are discharged, your primary care physician will handle any further medical issues. Please note that NO REFILLS for any discharge medications will be authorized once you are discharged, as it is imperative that you return to your primary care physician (or establish a relationship with a primary care physician if you do not have one) for your aftercare needs so that they can reassess your need for medications and monitor your lab values.  Discharge Instructions    Call MD for:  difficulty breathing, headache or visual disturbances    Complete by:  As directed    Call MD for:  extreme fatigue    Complete by:  As directed    Call MD for:  hives    Complete by:  As directed    Call MD for:  persistant dizziness or light-headedness    Complete by:  As directed    Call MD for:  persistant nausea and vomiting    Complete by:  As directed    Call MD for:  redness, tenderness, or signs of infection (pain, swelling, redness, odor or green/yellow discharge around incision site)    Complete by:  As directed    Call MD for:  severe uncontrolled pain     Complete by:  As directed    Call MD for:  temperature >100.4    Complete by:  As directed    Diet - low sodium heart healthy    Complete by:  As directed    Increase activity slowly    Complete by:  As directed      Allergies as of 07/01/2016      Reactions   Codeine    itching   Niacin    Skin flushed      Medication List    STOP taking these medications   pantoprazole 40 MG tablet Commonly known as:  PROTONIX   sucralfate 1 g tablet Commonly known as:  CARAFATE     TAKE these medications   aspirin 325 MG tablet Take 325 mg by mouth 3 (three) times a week.   bisoprolol-hydrochlorothiazide 2.5-6.25 MG tablet Commonly known as:  ZIAC Take 1 tablet by mouth daily.   CALCIUM-VITAMIN D PO Take 1 tablet by mouth daily.   fish oil-omega-3 fatty acids 1000 MG capsule Take 2 g by mouth daily.   folic acid 1 MG tablet Commonly known as:  FOLVITE TAKE 1 TABLET BY MOUTH DAILY   gabapentin 100 MG capsule Commonly known as:  NEURONTIN Take 100 mg by mouth 3 (three) times daily.   HYDROcodone-acetaminophen 5-325 MG tablet Commonly known as:  NORCO Take 1 tablet by mouth every 6 (six) hours as needed for moderate pain or severe pain.   irbesartan 150 MG tablet Commonly known as:  AVAPRO Take 150 mg by mouth daily.   levothyroxine 50 MCG tablet Commonly known as:  SYNTHROID, LEVOTHROID TAKE 1 TABLET BY MOUTH EVERY DAY   LORazepam 2 MG tablet Commonly known as:  ATIVAN Take 2 mg by mouth at bedtime. What changed:  Another medication with the same name was removed. Continue taking this medication, and follow the directions you see here.   nitroGLYCERIN 0.4 MG SL tablet Commonly known as:  NITROSTAT Place 1 tablet (0.4 mg total) under the tongue every 5 (five) minutes as needed for chest pain.   pregabalin 75 MG capsule Commonly known as:  LYRICA Take 75 mg by mouth daily.   VITAMIN B 12 PO Take 1 tablet by mouth daily.   VITAMIN D PO Take 1 tablet by mouth  daily.   ZETIA 10 MG tablet Generic drug:  ezetimibe Take 10 mg by mouth daily.       Allergies  Allergen Reactions  . Codeine     itching  . Niacin     Skin flushed    Consultations:  None    Procedures/Studies: Dg Chest 2 View  Result Date: 06/30/2016 CLINICAL DATA:  Syncope EXAM: CHEST  2 VIEW COMPARISON:  09/10/2015 FINDINGS: Moderate-sized hiatal hernia. Mild elevation of the right hemidiaphragm. Lungs are clear. Heart is normal size. No effusions or acute bony abnormality. IMPRESSION: Moderate-sized hiatal hernia. Stable elevation of the right hemidiaphragm. No active disease. Electronically Signed   By: Rolm Baptise  M.D.   On: 06/30/2016 11:55   Ct Head Wo Contrast  Result Date: 06/30/2016 CLINICAL DATA:  82 year old female with history of syncopal episode today. Fatigue. EXAM: CT HEAD WITHOUT CONTRAST TECHNIQUE: Contiguous axial images were obtained from the base of the skull through the vertex without intravenous contrast. COMPARISON:  Head CT 09/10/2015. FINDINGS: Brain: Patchy and confluent areas of decreased attenuation are noted throughout the deep and periventricular white matter of the cerebral hemispheres bilaterally, compatible with chronic microvascular ischemic disease. No evidence of acute infarction, hemorrhage, hydrocephalus, extra-axial collection or mass lesion/mass effect. Vascular: No hyperdense vessel or unexpected calcification. Skull: Normal. Negative for fracture or focal lesion. Sinuses/Orbits: No acute finding. Other: None. IMPRESSION: 1. No acute intracranial abnormalities. 2. Chronic microvascular ischemic changes in the cerebral white matter, as above. Electronically Signed   By: Vinnie Langton M.D.   On: 06/30/2016 15:45   ECHO 07/01/16 ------------------------------------------------------------------- Study Conclusions  - Left ventricle: The cavity size was normal. Systolic function was   normal. The estimated ejection fraction was in the  range of 60%   to 65%. Wall motion was normal; there were no regional wall   motion abnormalities. Doppler parameters are consistent with   abnormal left ventricular relaxation (grade 1 diastolic   dysfunction). - Aortic valve: There was trivial regurgitation. - Mitral valve: Mildly calcified annulus. There was trivial   regurgitation. - Right ventricle: The cavity size was normal. Systolic function   was normal. - Pulmonary arteries: PA peak pressure: 22 mm Hg (S). - Inferior vena cava: The vessel was normal in size. The   respirophasic diameter changes were in the normal range (= 50%),   consistent with normal central venous pressure.  Impressions:  - Normal LV size with EF 60-65%. Normal RV size and systolic   function. No significant valvular abnormalities.   Discharge Exam: Vitals:   07/01/16 0527 07/01/16 1414  BP: 114/72 123/72  Pulse: (!) 58 64  Resp: 16 18  Temp: 98 F (36.7 C) 98.7 F (37.1 C)   Vitals:   06/30/16 2030 07/01/16 0149 07/01/16 0527 07/01/16 1414  BP: (!) 145/72 118/76 114/72 123/72  Pulse: 64 (!) 57 (!) 58 64  Resp: 16 16 16 18   Temp: 98.7 F (37.1 C) 97.9 F (36.6 C) 98 F (36.7 C) 98.7 F (37.1 C)  TempSrc: Oral Oral Oral Oral  SpO2: 99% 96% 96% 98%  Weight:   60.1 kg (132 lb 7.9 oz)   Height:        General: Pt is alert, awake, not in acute distress Cardiovascular: RRR, S1/S2 +, no rubs, no gallops Respiratory: CTA bilaterally, no wheezing, no rhonchi Abdominal: Soft, NT, ND, bowel sounds + Extremities: no edema, no cyanosis   The results of significant diagnostics from this hospitalization (including imaging, microbiology, ancillary and laboratory) are listed below for reference.     Microbiology: No results found for this or any previous visit (from the past 240 hour(s)).   Labs: BNP (last 3 results) No results for input(s): BNP in the last 8760 hours. Basic Metabolic Panel:  Recent Labs Lab 06/30/16 1121  07/01/16 0305  NA 135 139  K 3.7 3.6  CL 103 106  CO2 26 27  GLUCOSE 187* 104*  BUN 15 14  CREATININE 0.67 0.43*  CALCIUM 9.1 8.9  MG 1.9  --   PHOS 3.7  --    Liver Function Tests:  Recent Labs Lab 07/01/16 0305  AST 20  ALT 16  ALKPHOS  66  BILITOT 0.8  PROT 6.1*  ALBUMIN 3.8   No results for input(s): LIPASE, AMYLASE in the last 168 hours. No results for input(s): AMMONIA in the last 168 hours. CBC:  Recent Labs Lab 06/30/16 1121 07/01/16 0305  WBC 7.5 7.7  HGB 13.4 12.5  HCT 39.7 36.7  MCV 91.7 90.8  PLT 189 188   Cardiac Enzymes:  Recent Labs Lab 06/30/16 1121 06/30/16 1840 06/30/16 2350 07/01/16 0637  TROPONINI <0.03 <0.03 <0.03 <0.03   BNP: Invalid input(s): POCBNP CBG:  Recent Labs Lab 07/01/16 0533  GLUCAP 89   D-Dimer No results for input(s): DDIMER in the last 72 hours. Hgb A1c No results for input(s): HGBA1C in the last 72 hours. Lipid Profile  Recent Labs  07/01/16 0305  CHOL 174  HDL 46  LDLCALC 101*  TRIG 133  CHOLHDL 3.8   Thyroid function studies  Recent Labs  06/30/16 1840  TSH 0.854   Anemia work up No results for input(s): VITAMINB12, FOLATE, FERRITIN, TIBC, IRON, RETICCTPCT in the last 72 hours. Urinalysis    Component Value Date/Time   COLORURINE YELLOW 06/30/2016 1242   APPEARANCEUR HAZY (A) 06/30/2016 1242   LABSPEC 1.006 06/30/2016 1242   PHURINE 6.0 06/30/2016 1242   GLUCOSEU NEGATIVE 06/30/2016 1242   HGBUR NEGATIVE 06/30/2016 1242   HGBUR trace-lysed 10/03/2009 0832   BILIRUBINUR NEGATIVE 06/30/2016 1242   BILIRUBINUR negative 04/17/2013 1317   BILIRUBINUR small 02/20/2012 1055   KETONESUR NEGATIVE 06/30/2016 1242   PROTEINUR NEGATIVE 06/30/2016 1242   UROBILINOGEN 0.2 04/17/2013 1317   UROBILINOGEN 0.2 10/03/2009 0832   NITRITE POSITIVE (A) 06/30/2016 1242   LEUKOCYTESUR SMALL (A) 06/30/2016 1242   Sepsis Labs Invalid input(s): PROCALCITONIN,  WBC,  LACTICIDVEN Microbiology No results  found for this or any previous visit (from the past 240 hour(s)).  SIGNED:  Chipper Oman, MD  [facility] 07/01/2016, 4:16 PM  Pager please text page via  [email or web address] Password [email or web address]

## 2016-07-01 NOTE — Evaluation (Signed)
Physical Therapy Evaluation Patient Details Name: Christina [name] MRN: [id number] DOB: 22-May-1934 Today's Date: 07/01/2016   History of Present Illness  Pt admitted through ED after passing out at [place].  Pt with hx of HTN, CAD and neuropathy  Clinical Impression  Pt admitted as above and currently mobilizing at baseline IND level.  Orthostatic BPs Sup 152/86 with HR 67; Sit   156/104 HR 69, and stand 170/87 HR 68.  Pt demonstrates ability to step bkwd, sideways and stork stand and skip unassisted.  Pt did struggle with tandem walking but able to correct for LOB unassisted.  Pt plans dc to home and states she attends balance classes regularly.  Will dc from PT service at this time.    Follow Up Recommendations No PT follow up    Equipment Recommendations  None recommended by PT    Recommendations for Other Services       Precautions / Restrictions Precautions Precautions: Fall Restrictions Weight Bearing Restrictions: No      Mobility  Bed Mobility Overal bed mobility: Modified Independent             General bed mobility comments: in/out bed unassisted  Transfers Overall transfer level: Modified independent Equipment used: None             General transfer comment: Pt self assisted with UEs   Ambulation/Gait Ambulation/Gait assistance: Min guard;Supervision;Independent Ambulation Distance (Feet): 400 Feet Assistive device: None Gait Pattern/deviations: WFL(Within Functional Limits)   Gait velocity interpretation: at or above normal speed for age/gender General Gait Details: Mild initial instability but no LOB and improved throughout time up on feet  Stairs            Wheelchair Mobility    Modified Rankin (Stroke Patients Only)       Balance Overall balance assessment: No apparent balance deficits (not formally assessed)                                           Pertinent Vitals/Pain Pain Assessment: No/denies pain     Home Living Family/patient expects to be discharged to:: Private residence Living Arrangements: Alone   Type of Home: House         Home Equipment: None      Prior Function Level of Independence: Independent         Comments: [facility] [employer] and [facility]     Hand Dominance        Extremity/Trunk Assessment   Upper Extremity Assessment Upper Extremity Assessment: Defer to OT evaluation    Lower Extremity Assessment Lower Extremity Assessment: Overall WFL for tasks assessed       Communication   Communication: HOH  Cognition Arousal/Alertness: Awake/alert Behavior During Therapy: WFL for tasks assessed/performed Overall Cognitive Status: Within Functional Limits for tasks assessed                      General Comments      Exercises     Assessment/Plan    PT Assessment Patent does not need any further PT services  PT Problem List         PT Treatment Interventions      PT Goals (Current goals can be found in the Care Plan section)  Acute Rehab PT Goals Patient Stated Goal: HOME!! PT Goal Formulation: All assessment and education  complete, DC therapy    Frequency     Barriers to discharge        Co-evaluation PT/OT/SLP Co-Evaluation/Treatment: Yes Reason for Co-Treatment: For patient/therapist safety (Pt admitted with syncope) PT goals addressed during session: Mobility/safety with mobility;Balance OT goals addressed during session: ADL's and self-care       End of Session Equipment Utilized During Treatment: Gait belt Activity Tolerance: Patient tolerated treatment well Patient left: in bed;with call bell/phone within reach Nurse Communication: Mobility status;Other (comment) (Orthostatic BP) PT Visit Diagnosis: Unsteadiness on feet (R26.81)    Functional Assessment Tool Used: AM-PAC 6 Clicks Basic Mobility Functional Limitation: Mobility: Walking and moving around Mobility: Walking and  Moving Around Current Status (M9311): At least 1 percent but less than 20 percent impaired, limited or restricted Mobility: Walking and Moving Around Goal Status [phone]): At least 1 percent but less than 20 percent impaired, limited or restricted Mobility: Walking and Moving Around Discharge Status [phone]): At least 1 percent but less than 20 percent impaired, limited or restricted    Time: 0828-0846 PT Time Calculation (min) (ACUTE ONLY): 18 min   Charges:   PT Evaluation $PT Eval Low Complexity: 1 Procedure     PT G Codes:   PT G-Codes **NOT FOR INPATIENT CLASS** Functional Assessment Tool Used: AM-PAC 6 Clicks Basic Mobility Functional Limitation: Mobility: Walking and moving around Mobility: Walking and Moving Around Current Status (H2257): At least 1 percent but less than 20 percent impaired, limited or restricted Mobility: Walking and Moving Around Goal Status [phone]): At least 1 percent but less than 20 percent impaired, limited or restricted Mobility: Walking and Moving Around Discharge Status [phone]): At least 1 percent but less than 20 percent impaired, limited or restricted     Yarel Rushlow 07/01/2016, 8:59 AM

## 2016-07-01 NOTE — Progress Notes (Signed)
Discharge instructions reviewed with patient utilizing teach back method no questions at this time. Patient discharged to home. ?

## 2016-07-02 LAB — HEMOGLOBIN A1C
HEMOGLOBIN A1C: 6.3 % — AB (ref 4.8–5.6)
Mean Plasma Glucose: 134 mg/dL

## 2016-11-19 ENCOUNTER — Other Ambulatory Visit: Payer: Self-pay | Admitting: Internal Medicine

## 2016-11-19 DIAGNOSIS — Z1231 Encounter for screening mammogram for malignant neoplasm of breast: Secondary | ICD-10-CM

## 2016-12-29 ENCOUNTER — Ambulatory Visit
Admission: RE | Admit: 2016-12-29 | Discharge: 2016-12-29 | Disposition: A | Discharge status: Home / Self Care | Payer: Medicare Other | Source: Ambulatory Visit | Attending: Internal Medicine | Admitting: Internal Medicine

## 2016-12-29 DIAGNOSIS — Z1231 Encounter for screening mammogram for malignant neoplasm of breast: Secondary | ICD-10-CM

## 2017-05-11 ENCOUNTER — Other Ambulatory Visit: Payer: Self-pay | Admitting: Internal Medicine

## 2017-05-11 DIAGNOSIS — M5416 Radiculopathy, lumbar region: Secondary | ICD-10-CM

## 2017-05-11 DIAGNOSIS — G609 Hereditary and idiopathic neuropathy, unspecified: Secondary | ICD-10-CM

## 2017-05-24 ENCOUNTER — Ambulatory Visit
Admission: RE | Admit: 2017-05-24 | Discharge: 2017-05-24 | Disposition: A | Discharge status: Home / Self Care | Payer: Medicare Other | Source: Ambulatory Visit | Attending: Internal Medicine | Admitting: Internal Medicine

## 2017-05-24 DIAGNOSIS — M5416 Radiculopathy, lumbar region: Secondary | ICD-10-CM

## 2017-05-24 DIAGNOSIS — G609 Hereditary and idiopathic neuropathy, unspecified: Secondary | ICD-10-CM

## 2017-12-07 ENCOUNTER — Other Ambulatory Visit: Payer: Self-pay | Admitting: Internal Medicine

## 2017-12-07 DIAGNOSIS — Z1231 Encounter for screening mammogram for malignant neoplasm of breast: Secondary | ICD-10-CM

## 2017-12-20 ENCOUNTER — Other Ambulatory Visit: Payer: Self-pay | Admitting: Dermatology

## 2017-12-20 DIAGNOSIS — C4492 Squamous cell carcinoma of skin, unspecified: Secondary | ICD-10-CM

## 2017-12-20 HISTORY — DX: Squamous cell carcinoma of skin, unspecified: C44.92

## 2017-12-23 ENCOUNTER — Ambulatory Visit (INDEPENDENT_AMBULATORY_CARE_PROVIDER_SITE_OTHER): Payer: Medicare Other

## 2017-12-23 ENCOUNTER — Encounter (INDEPENDENT_AMBULATORY_CARE_PROVIDER_SITE_OTHER): Payer: Self-pay | Admitting: Physician Assistant

## 2017-12-23 ENCOUNTER — Ambulatory Visit (INDEPENDENT_AMBULATORY_CARE_PROVIDER_SITE_OTHER): Payer: Medicare Other | Admitting: Physician Assistant

## 2017-12-23 DIAGNOSIS — M25512 Pain in left shoulder: Secondary | ICD-10-CM

## 2017-12-23 MED ORDER — METHYLPREDNISOLONE ACETATE 40 MG/ML IJ SUSP
40.0000 mg | INTRAMUSCULAR | Status: AC | PRN
  Administered 2017-12-23: 40 mg via INTRA_ARTICULAR

## 2017-12-23 MED ORDER — LIDOCAINE HCL 1 % IJ SOLN
3.0000 mL | INTRAMUSCULAR | Status: AC | PRN
  Administered 2017-12-23: 3 mL

## 2017-12-23 NOTE — Progress Notes (Signed)
Office Visit Note   Patient: Christina [name]           Date of Birth: [birth year]           MRN: [id number] Visit Date: 12/23/2017              Requested by: [name], MD [address] [place], [place] [address] PCP: [name], MD   Assessment & Plan: Visit Diagnoses:  1. Left shoulder pain, unspecified chronicity     Plan: She will follow-up with [place] on as-needed basis.  She is encouraged to continue to work on strengthening and range of motion of the left shoulder.  Questions encouraged and answered  Follow-Up Instructions: Return if symptoms worsen or fail to improve.   Orders:  Orders Placed This Encounter  Procedures  . Large Joint Inj  . XR Shoulder Left   No orders of the defined types were placed in this encounter.     Procedures: Large Joint Inj: L subacromial bursa on 12/23/2017 2:46 PM Indications: pain Details: 22 G 1.5 in needle, lateral approach  Arthrogram: No  Medications: 3 mL lidocaine 1 %; 40 mg methylPREDNISolone acetate 40 MG/ML Outcome: tolerated well, no immediate complications Procedure, treatment alternatives, risks and benefits explained, specific risks discussed. Consent was given by the patient. Immediately prior to procedure a time out was called to verify the correct patient, procedure, equipment, support staff and site/side marked as required. Patient was prepped and draped in the usual sterile fashion.       Clinical Data: No additional findings.   Subjective: Chief Complaint  Patient presents with  . Left Shoulder - Pain    HPI Mrs. Christina [name] is a 83 year old female comes in today due to left shoulder pain for the past month.  She is requesting a cortisone injection into the left shoulder.  Back in 1999 she had a large rotator cuff tear that was deemed unrepairable.  She is continue to work on range of motion strengthening shoulder since that time.  Said no known injury.  She denies any numbness tingling down the arm. Review of  Systems Denies fevers chills shortness of breath chest pain  Objective: Vital Signs: There were no vitals taken for this visit.  Physical Exam  Constitutional: She is oriented to person, place, and time. She appears well-developed and well-nourished. No distress.  Pulmonary/Chest: Effort normal.  Neurological: She is alert and oriented to person, place, and time.  Skin: She is not diaphoretic.    Ortho Exam Bilateral shoulders she has weakness left shoulder with external rotation against resistance.  Internal rotation 5 out of 5 bilaterally.  She has positive impingement sign on the left negative on the right.  She has full forward flexion left shoulder.  Range of motion shoulder fluid without crepitus. Specialty Comments:  No specialty comments available.  Imaging: Xr Shoulder Left  Result Date: 12/23/2017 Left shoulder 3 views: Shoulder is well located.  Humeral head without any significant arthritic findings.  Diminished subacromial space on the Y view.  The axillary view is slightly rotated difficult to interpret.  No acute fractures no bony maladies otherwise.    PMFS History: Patient Active Problem List   Diagnosis Date Noted  . Syncope 06/30/2016  . Medication management 07/03/2013  . Hypothyroidism 04/23/2013  . Hyperlipidemia 04/23/2013  . Urgency of urination 04/23/2013  . Hypertension 04/23/2013  . Sinusitis 03/01/2013  . Early cataract 05/07/2012  . Neuropathy ([place]) 05/06/2012  . Radicular pain of both lower  extremities 05/06/2012  . Lumbar spinal stenosis 10/20/2011  . Hyperglycemia 05/24/2011  . Long term current use of opiate analgesic 05/24/2011  . 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 Hx of dislocation of shoulder   . H/O: hysterectomy   . VITAMIN D DEFICIENCY 10/03/2009  . CAD, NATIVE VESSEL 05/01/2009  . CHEST PAIN, EXERTIONAL 05/01/2009  . LOW BACK PAIN SYNDROME 11/27/2008  . ANEMIA 09/25/2008  . DEPRESSION 09/25/2008  .  UNS ADVRS EFF OTH RX MEDICINAL&BIOLOGICAL SBSTNC 09/25/2008  . OSTEOPENIA 09/22/2007  . INSOMNIA 09/22/2007  . DIVERTICULOSIS OF COLON 09/06/2007  . COLONIC POLYPS, ADENOMATOUS, HX OF 09/06/2007  . HYPERLIPIDEMIA 05/10/2007  . HYPOTHYROIDISM 09/08/2006  . Essential hypertension 09/08/2006   Past Medical History:  Diagnosis Date  . Anemia, unspecified   . Cataract   . Chest pain   . Coronary atherosclerosis of native coronary artery   . Depressive disorder, not elsewhere classified   . Diabetes mellitus   . Disorder of bone and cartilage, unspecified   . Diverticulosis of colon (without mention of hemorrhage)   . External hemorrhoids without mention of complication   . Family history of malignant neoplasm of gastrointestinal tract   . H/O: hysterectomy   . History of back surgery 2013    spinal stenosis 7 31 [place]  . Hx of dislocation of shoulder    right  . Hyperlipidemia    134 12-2013 per pt. on meds  . Hypertension   . Insomnia, unspecified   . Lumbago   . Personal history of colonic polyps   . Unspecified adverse effect of other drug, medicinal and biological substance(995.29)   . Unspecified hypothyroidism   . Unspecified vitamin D deficiency   . Urinary frequency   . Urinary tract infection, site not specified     Family History  Problem Relation Age of Onset  . Heart disease Mother        died age 81 had dm  . Kidney disease Mother   . Diabetes Mother   . Diabetes Sister   . Diabetes Brother   . Heart disease Brother   . Colon cancer Brother   . Colon cancer Other        nephew  . Colon cancer Other        nephew  . Rectal cancer Other   . Stomach cancer Maternal Grandmother        thinks it was stomach cancer  . Esophageal cancer Neg Hx     Past Surgical History:  Procedure Laterality Date  . ABDOMINAL HYSTERECTOMY    . APPENDECTOMY     1961  . BACK SURGERY     2013   . CATARACT EXTRACTION Right 02-17-2014  . CHOLECYSTECTOMY     2006  .  COLONOSCOPY    . HEMORRHOID SURGERY    . POLYPECTOMY    . [place], 2001   Social History   Occupational History  . Occupation: retired    Fish farm manager: RETIRED  Tobacco Use  . Smoking status: Never Smoker  . Smokeless tobacco: Never Used  Substance and Sexual Activity  . Alcohol use: Yes    Alcohol/week: 7.0 standard drinks    Types: 7 Glasses of wine per week    Comment: 1 glass of wine daily  . Drug use: No    Types: Hydrocodone    Comment: uses hydrocodone  . Sexual activity: Never

## 2018-01-03 ENCOUNTER — Ambulatory Visit
Admission: RE | Admit: 2018-01-03 | Discharge: 2018-01-03 | Disposition: A | Discharge status: Home / Self Care | Payer: Medicare Other | Source: Ambulatory Visit | Attending: Internal Medicine | Admitting: Internal Medicine

## 2018-01-03 DIAGNOSIS — Z1231 Encounter for screening mammogram for malignant neoplasm of breast: Secondary | ICD-10-CM

## 2018-09-21 ENCOUNTER — Other Ambulatory Visit: Payer: Self-pay

## 2018-09-21 ENCOUNTER — Ambulatory Visit (INDEPENDENT_AMBULATORY_CARE_PROVIDER_SITE_OTHER): Payer: Medicare Other | Admitting: Physician Assistant

## 2018-09-21 ENCOUNTER — Encounter: Payer: Self-pay | Admitting: Physician Assistant

## 2018-09-21 DIAGNOSIS — M722 Plantar fascial fibromatosis: Secondary | ICD-10-CM

## 2018-09-21 MED ORDER — METHYLPREDNISOLONE ACETATE 40 MG/ML IJ SUSP
13.3300 mg | INTRAMUSCULAR | Status: AC | PRN
  Administered 2018-09-21: 13:00:00 13.33 mg

## 2018-09-21 MED ORDER — LIDOCAINE HCL 1 % IJ SOLN
0.5000 mL | INTRAMUSCULAR | Status: AC | PRN
  Administered 2018-09-21: .5 mL

## 2018-09-21 NOTE — Progress Notes (Signed)
Office Visit Note   Patient: Christina [name]           Date of Birth: [birth year]           MRN: [id number] Visit Date: 09/21/2018              Requested by: [name], MD [address] [place], [place] [address] PCP: [name], MD   Assessment & Plan: Visit Diagnoses:  1. Plantar fasciitis, left     Plan: Discussed gastrocsoleus stretching exercises with her shoe wear.  Have her follow-up on an as-needed basis pain persist or becomes worse.  Questions were encouraged and answered at length.  Follow-Up Instructions: Return if symptoms worsen or fail to improve.   Orders:  Orders Placed This Encounter  Procedures  . Foot Inj   No orders of the defined types were placed in this encounter.     Procedures: Foot Inj Date/Time: 09/21/2018 1:27 PM Performed by: Pete Pelt, PA-C Authorized by: Pete Pelt, PA-C   Indications:  Fasciitis Condition: Plantar Fasciitis   Location: left plantar fascia muscle   Prep: patient was prepped and draped in usual sterile fashion   Needle Size:  22 G Medications:  0.5 mL lidocaine 1 %; 13.33 mg methylPREDNISolone acetate 40 MG/ML     Clinical Data: No additional findings.   Subjective: Chief Complaint  Patient presents with  . Left Foot - Pain    HPI [name] returns today with left foot pain plantar aspect of her left heel.  No known injury.  Pain is been present for the past 2 to 3 weeks.  Is worse with the first step of the day.  She does wear orthotics in her shoes and pliable.  She denies any open back shoes.  She is been taking Aleve which helps some.  Review of Systems See HPI  No fevers or chills.  Objective: Vital Signs: There were no vitals taken for this visit.  Physical Exam Constitutional:      Appearance: She is normal weight. She is not ill-appearing or diaphoretic.  Cardiovascular:     Pulses: Normal pulses.  Pulmonary:     Effort: Pulmonary effort is normal.  Neurological:   Mental Status: She is alert and oriented to person, place, and time.     Ortho Exam Left foot tenderness medial tubercle of calcaneus.  No tenderness over the posterior tibial tendons peroneal tendons.  5 out of 5 strength with inversion eversion of the foot without pain.  Nontender over the Achilles tendon Achilles is intact on the left.  Exquisitely tight gastroc on the left.  No rashes skin lesions ulcerations of the left foot. Specialty Comments:  No specialty comments available.  Imaging: No results found.   PMFS History: Patient Active Problem List   Diagnosis Date Noted  . Syncope 06/30/2016  . Medication management 07/03/2013  . Hypothyroidism 04/23/2013  . Hyperlipidemia 04/23/2013  . Urgency of urination 04/23/2013  . Hypertension 04/23/2013  . Sinusitis 03/01/2013  . Early cataract 05/07/2012  . Neuropathy ([place]) 05/06/2012  . Radicular pain of both lower extremities 05/06/2012  . Lumbar spinal stenosis 10/20/2011  . Hyperglycemia 05/24/2011  . Long term current use of opiate analgesic 05/24/2011  . 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 Hx of dislocation of shoulder   . H/O: hysterectomy   . VITAMIN D DEFICIENCY 10/03/2009  . CAD, NATIVE VESSEL 05/01/2009  . CHEST PAIN, EXERTIONAL 05/01/2009  . LOW BACK  PAIN SYNDROME 11/27/2008  . ANEMIA 09/25/2008  . DEPRESSION 09/25/2008  . UNS ADVRS EFF OTH RX MEDICINAL&BIOLOGICAL SBSTNC 09/25/2008  . OSTEOPENIA 09/22/2007  . INSOMNIA 09/22/2007  . DIVERTICULOSIS OF COLON 09/06/2007  . COLONIC POLYPS, ADENOMATOUS, HX OF 09/06/2007  . HYPERLIPIDEMIA 05/10/2007  . HYPOTHYROIDISM 09/08/2006  . Essential hypertension 09/08/2006   Past Medical History:  Diagnosis Date  . Anemia, unspecified   . Cataract   . Chest pain   . Coronary atherosclerosis of native coronary artery   . Depressive disorder, not elsewhere classified   . Diabetes mellitus   . Disorder of bone and cartilage, unspecified   .  Diverticulosis of colon (without mention of hemorrhage)   . External hemorrhoids without mention of complication   . Family history of malignant neoplasm of gastrointestinal tract   . H/O: hysterectomy   . History of back surgery 2013    spinal stenosis 7 31 [place]  . Hx of dislocation of shoulder    right  . Hyperlipidemia    134 12-2013 per pt. on meds  . Hypertension   . Insomnia, unspecified   . Lumbago   . Personal history of colonic polyps   . Unspecified adverse effect of other drug, medicinal and biological substance(995.29)   . Unspecified hypothyroidism   . Unspecified vitamin D deficiency   . Urinary frequency   . Urinary tract infection, site not specified     Family History  Problem Relation Age of Onset  . Heart disease Mother        died age 39 had dm  . Kidney disease Mother   . Diabetes Mother   . Diabetes Sister   . Diabetes Brother   . Heart disease Brother   . Colon cancer Brother   . Colon cancer Other        nephew  . Colon cancer Other        nephew  . Rectal cancer Other   . Stomach cancer Maternal Grandmother        thinks it was stomach cancer  . Esophageal cancer Neg Hx     Past Surgical History:  Procedure Laterality Date  . ABDOMINAL HYSTERECTOMY    . APPENDECTOMY     1961  . BACK SURGERY     2013   . CATARACT EXTRACTION Right 02-17-2014  . CHOLECYSTECTOMY     2006  . COLONOSCOPY    . HEMORRHOID SURGERY    . POLYPECTOMY    . [place], 2001   Social History   Occupational History  . Occupation: retired    Fish farm manager: RETIRED  Tobacco Use  . Smoking status: Never Smoker  . Smokeless tobacco: Never Used  Substance and Sexual Activity  . Alcohol use: Yes    Alcohol/week: 7.0 standard drinks    Types: 7 Glasses of wine per week    Comment: 1 glass of wine daily  . Drug use: No    Types: Hydrocodone    Comment: uses hydrocodone  . Sexual activity: Never

## 2018-12-02 ENCOUNTER — Other Ambulatory Visit: Payer: Self-pay | Admitting: Internal Medicine

## 2018-12-02 DIAGNOSIS — Z1231 Encounter for screening mammogram for malignant neoplasm of breast: Secondary | ICD-10-CM

## 2019-01-17 ENCOUNTER — Ambulatory Visit: Payer: Medicare Other

## 2019-02-07 ENCOUNTER — Other Ambulatory Visit: Payer: Self-pay

## 2019-02-07 ENCOUNTER — Ambulatory Visit
Admission: RE | Admit: 2019-02-07 | Discharge: 2019-02-07 | Disposition: A | Discharge status: Home / Self Care | Payer: Medicare Other | Source: Ambulatory Visit | Attending: Internal Medicine | Admitting: Internal Medicine

## 2019-02-07 DIAGNOSIS — Z1231 Encounter for screening mammogram for malignant neoplasm of breast: Secondary | ICD-10-CM

## 2019-02-23 ENCOUNTER — Ambulatory Visit (INDEPENDENT_AMBULATORY_CARE_PROVIDER_SITE_OTHER): Payer: Medicare Other

## 2019-02-23 ENCOUNTER — Ambulatory Visit: Payer: Medicare Other | Admitting: Podiatry

## 2019-02-23 ENCOUNTER — Other Ambulatory Visit: Payer: Self-pay

## 2019-02-23 ENCOUNTER — Encounter: Payer: Self-pay | Admitting: Podiatry

## 2019-02-23 DIAGNOSIS — M722 Plantar fascial fibromatosis: Secondary | ICD-10-CM

## 2019-02-23 MED ORDER — MELOXICAM 15 MG PO TABS: 15.0000 mg | ORAL_TABLET | Freq: Every day | ORAL | 2 refills | Status: DC

## 2019-02-23 NOTE — Patient Instructions (Signed)

## 2019-02-28 NOTE — Progress Notes (Signed)
Subjective:   Patient ID: Christina [name], female   DOB: 84 y.o.   MRN: [id number]   HPI Patient presents stating been having a lot of pain in the bottom of the left heel and states it is been sore and making it hard to walk.  Patient's had this trouble before and had previous injection in March which helped temporarily and does not smoke likes to be active   Review of Systems  All other systems reviewed and are negative.       Objective:  Physical Exam Vitals signs and nursing note reviewed.  Constitutional:      Appearance: She is well-developed.  Pulmonary:     Effort: Pulmonary effort is normal.  Musculoskeletal: Normal range of motion.  Skin:    General: Skin is warm.  Neurological:     Mental Status: She is alert.     Neurovascular status intact muscle strength found to be adequate range of motion within normal limits.  Patient is found to have moderate depression of the arch bilateral is found to have exquisite discomfort plantar aspect left heel at the insertional point of the tendon into the calcaneus with inflammation fluid of the medial band at its insertion into the calcaneus     Assessment:  Acute plantar fasciitis left with inflammation fluid of the medial band     Plan:  H&P reviewed condition and today did sterile prep and injected the fascia 3 mg Kenalog 5 mg Xylocaine and applied fascial brace with instructions on usage.  Gave instructions for physical therapy shoe gear modification and reappoint to recheck 3 weeks  X-rays indicate small spur no indications of stress fracture arthritis

## 2019-03-16 ENCOUNTER — Ambulatory Visit (INDEPENDENT_AMBULATORY_CARE_PROVIDER_SITE_OTHER): Payer: Medicare Other | Admitting: Podiatry

## 2019-03-16 ENCOUNTER — Other Ambulatory Visit: Payer: Self-pay

## 2019-03-16 ENCOUNTER — Encounter: Payer: Self-pay | Admitting: Podiatry

## 2019-03-16 DIAGNOSIS — M722 Plantar fascial fibromatosis: Secondary | ICD-10-CM | POA: Diagnosis not present

## 2019-03-16 DIAGNOSIS — M1990 Unspecified osteoarthritis, unspecified site: Secondary | ICD-10-CM | POA: Insufficient documentation

## 2019-03-16 NOTE — Progress Notes (Signed)
Subjective:   Patient ID: Christina [name], female   DOB: 84 y.o.   MRN: [id number]   HPI Patient states her left heel has improved quite a bit and is mildly discomforting but better with brace helping her a lot   ROS      Objective:  Physical Exam  Neurovascular status intact negative Bevelyn Buckles' sign noted with patient's left heel improved with mild discomfort still upon deep palpation with moderate depression of the arch noted     Assessment:  Plantar fasciitis improved left with moderate discomfort still noted     Plan:  H&P reviewed condition and recommended stretching exercises anti-inflammatories physical therapy and shoe gear modifications.  Reviewed what to do if any other issues were to occur and patient will be seen back on an as-needed basis

## 2019-05-20 ENCOUNTER — Other Ambulatory Visit: Payer: Self-pay | Admitting: Podiatry

## 2019-05-30 ENCOUNTER — Other Ambulatory Visit: Payer: Self-pay | Admitting: Physician Assistant

## 2019-07-18 ENCOUNTER — Ambulatory Visit (HOSPITAL_COMMUNITY)
Admission: EM | Admit: 2019-07-18 | Discharge: 2019-07-18 | Disposition: A | Discharge status: Home / Self Care | Payer: Medicare Other | Source: Home / Self Care

## 2019-07-18 ENCOUNTER — Encounter (HOSPITAL_COMMUNITY): Payer: Self-pay | Admitting: Emergency Medicine

## 2019-07-18 ENCOUNTER — Encounter (HOSPITAL_COMMUNITY): Payer: Self-pay

## 2019-07-18 ENCOUNTER — Emergency Department (HOSPITAL_COMMUNITY)
Admission: EM | Admit: 2019-07-18 | Discharge: 2019-07-18 | Disposition: A | Discharge status: Home / Self Care | Payer: Medicare Other | Attending: Emergency Medicine | Admitting: Emergency Medicine

## 2019-07-18 ENCOUNTER — Other Ambulatory Visit: Payer: Self-pay

## 2019-07-18 DIAGNOSIS — Y92007 Garden or yard of unspecified non-institutional (private) residence as the place of occurrence of the external cause: Secondary | ICD-10-CM | POA: Insufficient documentation

## 2019-07-18 DIAGNOSIS — S51812A Laceration without foreign body of left forearm, initial encounter: Secondary | ICD-10-CM

## 2019-07-18 DIAGNOSIS — Z79899 Other long term (current) drug therapy: Secondary | ICD-10-CM | POA: Insufficient documentation

## 2019-07-18 DIAGNOSIS — E119 Type 2 diabetes mellitus without complications: Secondary | ICD-10-CM | POA: Diagnosis not present

## 2019-07-18 DIAGNOSIS — W01198A Fall on same level from slipping, tripping and stumbling with subsequent striking against other object, initial encounter: Secondary | ICD-10-CM | POA: Diagnosis not present

## 2019-07-18 DIAGNOSIS — Y9389 Activity, other specified: Secondary | ICD-10-CM | POA: Diagnosis not present

## 2019-07-18 DIAGNOSIS — E039 Hypothyroidism, unspecified: Secondary | ICD-10-CM | POA: Diagnosis not present

## 2019-07-18 DIAGNOSIS — Z7982 Long term (current) use of aspirin: Secondary | ICD-10-CM | POA: Insufficient documentation

## 2019-07-18 DIAGNOSIS — S41112A Laceration without foreign body of left upper arm, initial encounter: Secondary | ICD-10-CM

## 2019-07-18 DIAGNOSIS — I10 Essential (primary) hypertension: Secondary | ICD-10-CM | POA: Insufficient documentation

## 2019-07-18 DIAGNOSIS — Y999 Unspecified external cause status: Secondary | ICD-10-CM | POA: Diagnosis not present

## 2019-07-18 DIAGNOSIS — Z23 Encounter for immunization: Secondary | ICD-10-CM | POA: Insufficient documentation

## 2019-07-18 MED ORDER — LIDOCAINE-EPINEPHRINE (PF) 2 %-1:200000 IJ SOLN
10.0000 mL | Freq: Once | INTRAMUSCULAR | Status: AC
  Administered 2019-07-18: 10 mL
  Filled 2019-07-18: qty 20

## 2019-07-18 MED ORDER — BACITRACIN ZINC 500 UNIT/GM EX OINT: TOPICAL_OINTMENT | Freq: Once | CUTANEOUS | Status: AC

## 2019-07-18 MED ORDER — ACETAMINOPHEN 500 MG PO TABS
1000.0000 mg | ORAL_TABLET | Freq: Once | ORAL | Status: AC
  Administered 2019-07-18: 1000 mg via ORAL
  Filled 2019-07-18: qty 2

## 2019-07-18 MED ORDER — ACETAMINOPHEN 325 MG PO TABS
650.0000 mg | ORAL_TABLET | Freq: Once | ORAL | Status: AC
  Administered 2019-07-18: 17:00:00 650 mg via ORAL

## 2019-07-18 MED ORDER — TETANUS-DIPHTH-ACELL PERTUSSIS 5-2.5-18.5 LF-MCG/0.5 IM SUSP
0.5000 mL | Freq: Once | INTRAMUSCULAR | Status: AC
  Administered 2019-07-18: 23:00:00 0.5 mL via INTRAMUSCULAR
  Filled 2019-07-18: qty 0.5

## 2019-07-18 MED ORDER — ACETAMINOPHEN 325 MG PO TABS
ORAL_TABLET | ORAL | Status: AC
  Filled 2019-07-18: qty 2

## 2019-07-18 NOTE — ED Triage Notes (Signed)
Pt. Stated, I was trying to start the tiller and when I pulled it, I fell and hurt my left forearm.   EMS wrapped the arm with gauze and coban

## 2019-07-18 NOTE — ED Provider Notes (Signed)
[facility] EMERGENCY DEPARTMENT Provider Note   CSN: [id number] Arrival date & time: 07/18/19  1717     History Chief Complaint  Patient presents with  . Fall  . Extremity Laceration    Christina [name] is a 85 y.o. female.  Pt presents to the ED today with a laceration to her left lower arm.  Pt fell and hit her arm on the brick wall in her yard.  Pt initially went to pcp then to urgent care and then was sent here for repair.  She is unsure when she had her last tetanus.  She denies any other injuries.  No problems moving her fingers.  No numbness in her hand.        Past Medical History:  Diagnosis Date  . Anemia, unspecified   . Cataract   . Chest pain   . Coronary atherosclerosis of native coronary artery   . Depressive disorder, not elsewhere classified   . Diabetes mellitus   . Disorder of bone and cartilage, unspecified   . Diverticulosis of colon (without mention of hemorrhage)   . External hemorrhoids without mention of complication   . Family history of malignant neoplasm of gastrointestinal tract   . H/O: hysterectomy   . History of back surgery 2013    spinal stenosis 7 31 [place]  . Hx of dislocation of shoulder    right  . Hyperlipidemia    134 12-2013 per pt. on meds  . Hypertension   . Insomnia, unspecified   . Lumbago   . Personal history of colonic polyps   . Unspecified adverse effect of other drug, medicinal and biological substance(995.29)   . Unspecified hypothyroidism   . Unspecified vitamin D deficiency   . Urinary frequency   . Urinary tract infection, site not specified     Patient Active Problem List   Diagnosis Date Noted  . Arthritis 03/16/2019  . Syncope 06/30/2016  . Medication management 07/03/2013  . Hypothyroidism 04/23/2013  . Hyperlipidemia 04/23/2013  . Urgency of urination 04/23/2013  . Hypertension 04/23/2013  . Sinusitis 03/01/2013  . Anxiety 12/20/2012  . Early cataract 05/07/2012  .  Neuropathy ([place]) 05/06/2012  . Radicular pain of both lower extremities 05/06/2012  . Lumbar spinal stenosis 10/20/2011  . Hyperglycemia 05/24/2011  . Long term current use of opiate analgesic 05/24/2011  . 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 Hx of dislocation of shoulder   . H/O: hysterectomy   . VITAMIN D DEFICIENCY 10/03/2009  . CAD, NATIVE VESSEL 05/01/2009  . CHEST PAIN, EXERTIONAL 05/01/2009  . LOW BACK PAIN SYNDROME 11/27/2008  . ANEMIA 09/25/2008  . DEPRESSION 09/25/2008  . UNS ADVRS EFF OTH RX MEDICINAL&BIOLOGICAL SBSTNC 09/25/2008  . OSTEOPENIA 09/22/2007  . INSOMNIA 09/22/2007  . DIVERTICULOSIS OF COLON 09/06/2007  . COLONIC POLYPS, ADENOMATOUS, HX OF 09/06/2007  . HYPERLIPIDEMIA 05/10/2007  . HYPOTHYROIDISM 09/08/2006  . Essential hypertension 09/08/2006    Past Surgical History:  Procedure Laterality Date  . ABDOMINAL HYSTERECTOMY    . APPENDECTOMY     1961  . BACK SURGERY     2013   . CATARACT EXTRACTION Right 02-17-2014  . CHOLECYSTECTOMY     2006  . COLONOSCOPY    . HEMORRHOID SURGERY    . POLYPECTOMY    . [place]     OB History   No obstetric history on file.     Family History  Problem Relation Age of Onset  . Heart disease Mother        died age 83 had dm  . Kidney disease Mother   . Diabetes Mother   . Diabetes Sister   . Diabetes Brother   . Heart disease Brother   . Colon cancer Brother   . Colon cancer Other        nephew  . Colon cancer Other        nephew  . Rectal cancer Other   . Stomach cancer Maternal Grandmother        thinks it was stomach cancer  . Esophageal cancer Neg Hx     Social History   Tobacco Use  . Smoking status: Never Smoker  . Smokeless tobacco: Never Used  Substance Use Topics  . Alcohol use: Yes    Alcohol/week: 7.0 standard drinks    Types: 7 Glasses of wine per week    Comment: 1 glass of wine daily  . Drug use: No    Types: Hydrocodone     Comment: uses hydrocodone    Home Medications Prior to Admission medications   Medication Sig Start Date End Date Taking? Authorizing Provider  aspirin 325 MG tablet Take 325 mg by mouth 3 (three) times a week.     [provider]  bisoprolol-hydrochlorothiazide (ZIAC) 2.5-6.25 MG tablet Take 1 tablet by mouth daily.     [provider]  CALCIUM-VITAMIN D PO Take 1 tablet by mouth daily.     [provider]  Cholecalciferol (VITAMIN D PO) Take 1 tablet by mouth daily.     [provider]  Cyanocobalamin (VITAMIN B 12 PO) Take 1 tablet by mouth daily.     [provider]  escitalopram (LEXAPRO) 10 MG tablet Take 10 mg by mouth daily. 02/28/19   [provider]  ezetimibe (ZETIA) 10 MG tablet Take 10 mg by mouth daily. 10/19/15   [provider]  fish oil-omega-3 fatty acids 1000 MG capsule Take 2 g by mouth daily.      [provider]  folic acid (FOLVITE) 1 MG tablet TAKE 1 TABLET BY MOUTH DAILY 12/02/10   Hoover Browns., MD  gabapentin (NEURONTIN) 600 MG tablet Take 600 mg by mouth 3 (three) times daily. 01/01/19   [provider]  HYDROcodone-acetaminophen (NORCO) 5-325 MG tablet Take 1 tablet by mouth every 6 (six) hours as needed for moderate pain or severe pain. 09/10/15   Orlie Dakin, MD  irbesartan (AVAPRO) 150 MG tablet Take 150 mg by mouth daily. 11/04/15   [provider]  levothyroxine (SYNTHROID, LEVOTHROID) 50 MCG tablet TAKE 1 TABLET BY MOUTH EVERY DAY    Panosh, Standley Brooking, MD  LORazepam (ATIVAN) 2 MG tablet Take 2 mg by mouth at bedtime.    [provider]  meloxicam (MOBIC) 15 MG tablet TAKE 1 TABLET(15 MG) BY MOUTH DAILY 05/22/19   Regal, Tamala Fothergill, DPM  nitroGLYCERIN (NITROSTAT) 0.4 MG SL tablet Place 1 tablet (0.4 mg total) under the tongue every 5 (five) minutes as needed for chest pain. 03/28/12   Wall, Marijo Conception, MD  pregabalin (LYRICA) 75 MG capsule Take 75 mg by mouth  daily.     [provider]    Allergies    Codeine and Niacin  Review of Systems   Review of Systems  Skin: Positive for wound.  All other systems reviewed and are negative.   Physical Exam Updated Vital Signs BP Marland Kitchen)  154/86   Pulse (!) 58   Temp 98 F (36.7 C) (Oral)   Resp 17   Ht 5' 3.5" (1.613 m)   Wt 59 kg   SpO2 97%   BMI 22.67 kg/m   Physical Exam Vitals and nursing note reviewed.  Constitutional:      Appearance: Normal appearance.  HENT:     Head: Normocephalic and atraumatic.     Right Ear: External ear normal.     Left Ear: External ear normal.     Nose: Nose normal.     Mouth/Throat:     Mouth: Mucous membranes are moist.     Pharynx: Oropharynx is clear.  Eyes:     Extraocular Movements: Extraocular movements intact.     Conjunctiva/sclera: Conjunctivae normal.     Pupils: Pupils are equal, round, and reactive to light.  Cardiovascular:     Rate and Rhythm: Normal rate and regular rhythm.     Pulses: Normal pulses.     Heart sounds: Normal heart sounds.  Pulmonary:     Effort: Pulmonary effort is normal.     Breath sounds: Normal breath sounds.  Abdominal:     General: Abdomen is flat. Bowel sounds are normal.     Palpations: Abdomen is soft.  Musculoskeletal:        General: Normal range of motion.     Cervical back: Normal range of motion and neck supple.  Skin:    General: Skin is warm.     Capillary Refill: Capillary refill takes less than 2 seconds.       Neurological:     General: No focal deficit present.     Mental Status: She is alert and oriented to person, place, and time.  Psychiatric:        Mood and Affect: Mood normal.        Behavior: Behavior normal.     ED Results / Procedures / Treatments   Labs (all labs ordered are listed, but only abnormal results are displayed) Labs Reviewed - No data to display  EKG None  Radiology No results found.  Procedures .Marland KitchenLaceration Repair  Date/Time: 07/18/2019 11:02  PM Performed by: Isla Pence, MD Authorized by: Isla Pence, MD   Consent:    Consent obtained:  Verbal   Consent given by:  Patient   Risks discussed:  Infection, pain and poor cosmetic result   Alternatives discussed:  No treatment Anesthesia (see MAR for exact dosages):    Anesthesia method:  Local infiltration   Local anesthetic:  Lidocaine 2% WITH epi Laceration details:    Location:  Shoulder/arm   Shoulder/arm location:  L lower arm   Length (cm):  12 Repair type:    Repair type:  Intermediate Pre-procedure details:    Preparation:  Patient was prepped and draped in usual sterile fashion Exploration:    Hemostasis achieved with:  Epinephrine   Wound exploration: wound explored through full range of motion and entire depth of wound probed and visualized     Wound extent: no foreign bodies/material noted, no nerve damage noted, no tendon damage noted, no underlying fracture noted and no vascular damage noted     Contaminated: no   Treatment:    Area cleansed with:  Saline   Amount of cleaning:  Standard   Irrigation solution:  Sterile saline   Irrigation method:  Pressure wash Skin repair:    Repair method:  Sutures   Suture size:  4-0   Suture material:  Prolene   Suture technique:  Simple interrupted   Number of sutures:  18 Approximation:    Approximation:  Close Post-procedure details:    Dressing:  Antibiotic ointment and non-adherent dressing   Patient tolerance of procedure:  Tolerated well, no immediate complications   (including critical care time)  Medications Ordered in ED Medications  lidocaine-EPINEPHrine (XYLOCAINE W/EPI) 2 %-1:200000 (PF) injection 10 mL (has no administration in time range)  Tdap (BOOSTRIX) injection 0.5 mL (has no administration in time range)  acetaminophen (TYLENOL) tablet 1,000 mg (has no administration in time range)  bacitracin ointment (has no administration in time range)    ED Course  I have reviewed the  triage vital signs and the nursing notes.  Pertinent labs & imaging results that were available during my care of the patient were reviewed by me and considered in my medical decision making (see chart for details).    MDM Rules/Calculators/A&P                      Pt told to return or go to her PCP in 7 to 10 days to have the sutures removed.  Final Clinical Impression(s) / ED Diagnoses Final diagnoses:  Laceration of left upper extremity, initial encounter    Rx / DC Orders ED Discharge Orders    None       Isla Pence, MD 07/18/19 2306

## 2019-07-18 NOTE — Discharge Instructions (Signed)
Please report to the [facility] emergency room now as you are in need of more extensive suture repair than we can provide in the urgent care setting.

## 2019-07-18 NOTE — ED Provider Notes (Signed)
[place]   MRN: [id number] DOB: April 17, 1934  Subjective:   Christina [name] is a 85 y.o. female presenting for suffering an accidental fall today.  Patient states that she scraped her left forearm against a brick and suffered a deep laceration.  She came straight to our clinic.  Has not taken anything for pain.   Current Facility-Administered Medications:  .  acetaminophen (TYLENOL) tablet 650 mg, 650 mg, Oral, Once, Jaynee Eagles, PA-C  Current Outpatient Medications:  .  aspirin 325 MG tablet, Take 325 mg by mouth 3 (three) times a week. , Disp: , Rfl:  .  bisoprolol-hydrochlorothiazide (ZIAC) 2.5-6.25 MG tablet, Take 1 tablet by mouth daily. , Disp: , Rfl:  .  CALCIUM-VITAMIN D PO, Take 1 tablet by mouth daily. , Disp: , Rfl:  .  Cholecalciferol (VITAMIN D PO), Take 1 tablet by mouth daily. , Disp: , Rfl:  .  Cyanocobalamin (VITAMIN B 12 PO), Take 1 tablet by mouth daily. , Disp: , Rfl:  .  escitalopram (LEXAPRO) 10 MG tablet, Take 10 mg by mouth daily., Disp: , Rfl:  .  ezetimibe (ZETIA) 10 MG tablet, Take 10 mg by mouth daily., Disp: , Rfl:  .  fish oil-omega-3 fatty acids 1000 MG capsule, Take 2 g by mouth daily.  , Disp: , Rfl:  .  folic acid (FOLVITE) 1 MG tablet, TAKE 1 TABLET BY MOUTH DAILY, Disp: 30 tablet, Rfl: 3 .  gabapentin (NEURONTIN) 600 MG tablet, Take 600 mg by mouth 3 (three) times daily., Disp: , Rfl:  .  HYDROcodone-acetaminophen (NORCO) 5-325 MG tablet, Take 1 tablet by mouth every 6 (six) hours as needed for moderate pain or severe pain., Disp: 15 tablet, Rfl: 0 .  irbesartan (AVAPRO) 150 MG tablet, Take 150 mg by mouth daily., Disp: , Rfl:  .  levothyroxine (SYNTHROID, LEVOTHROID) 50 MCG tablet, TAKE 1 TABLET BY MOUTH EVERY DAY, Disp: 30 tablet, Rfl: 5 .  LORazepam (ATIVAN) 2 MG tablet, Take 2 mg by mouth at bedtime., Disp: , Rfl:  .  meloxicam (MOBIC) 15 MG tablet, TAKE 1 TABLET(15 MG) BY MOUTH DAILY, Disp: 30 tablet, Rfl: 2 .  nitroGLYCERIN (NITROSTAT)  0.4 MG SL tablet, Place 1 tablet (0.4 mg total) under the tongue every 5 (five) minutes as needed for chest pain., Disp: 25 tablet, Rfl: 11 .  pregabalin (LYRICA) 75 MG capsule, Take 75 mg by mouth daily. , Disp: , Rfl:    Allergies  Allergen Reactions  . Codeine     itching  . Niacin     Skin flushed    Past Medical History:  Diagnosis Date  . Anemia, unspecified   . Cataract   . Chest pain   . Coronary atherosclerosis of native coronary artery   . Depressive disorder, not elsewhere classified   . Diabetes mellitus   . Disorder of bone and cartilage, unspecified   . Diverticulosis of colon (without mention of hemorrhage)   . External hemorrhoids without mention of complication   . Family history of malignant neoplasm of gastrointestinal tract   . H/O: hysterectomy   . History of back surgery 2013    spinal stenosis 7 31 [place]  . Hx of dislocation of shoulder    right  . Hyperlipidemia    134 12-2013 per pt. on meds  . Hypertension   . Insomnia, unspecified   . Lumbago   . Personal history of colonic polyps   . Unspecified adverse effect of other  drug, medicinal and biological substance(995.29)   . Unspecified hypothyroidism   . Unspecified vitamin D deficiency   . Urinary frequency   . Urinary tract infection, site not specified      Past Surgical History:  Procedure Laterality Date  . ABDOMINAL HYSTERECTOMY    . APPENDECTOMY     1961  . BACK SURGERY     2013   . CATARACT EXTRACTION Right 02-17-2014  . CHOLECYSTECTOMY     2006  . COLONOSCOPY    . HEMORRHOID SURGERY    . POLYPECTOMY    . ROTATOR CUFF REPAIR     1999, 2001    Family History  Problem Relation Age of Onset  . Heart disease Mother        died age 21 had dm  . Kidney disease Mother   . Diabetes Mother   . Diabetes Sister   . Diabetes Brother   . Heart disease Brother   . Colon cancer Brother   . Colon cancer Other        nephew  . Colon cancer Other        nephew  . Rectal  cancer Other   . Stomach cancer Maternal Grandmother        thinks it was stomach cancer  . Esophageal cancer Neg Hx     Social History   Tobacco Use  . Smoking status: Never Smoker  . Smokeless tobacco: Never Used  Substance Use Topics  . Alcohol use: Yes    Alcohol/week: 7.0 standard drinks    Types: 7 Glasses of wine per week    Comment: 1 glass of wine daily  . Drug use: No    Types: Hydrocodone    Comment: uses hydrocodone    ROS   Objective:   Vitals: BP (!) 145/87 (BP Location: Right Arm)   Pulse 65   Temp 98.7 F (37.1 C) (Oral)   Resp 16   SpO2 98%   Physical Exam Constitutional:      General: She is not in acute distress.    Appearance: Normal appearance. She is well-developed. She is not ill-appearing.  HENT:     Head: Normocephalic and atraumatic.     Nose: Nose normal.     Mouth/Throat:     Mouth: Mucous membranes are moist.     Pharynx: Oropharynx is clear.  Eyes:     General: No scleral icterus.       Right eye: No discharge.        Left eye: No discharge.     Extraocular Movements: Extraocular movements intact.     Pupils: Pupils are equal, round, and reactive to light.  Cardiovascular:     Rate and Rhythm: Normal rate.  Pulmonary:     Effort: Pulmonary effort is normal.  Musculoskeletal:       Arms:  Skin:    General: Skin is warm and dry.  Neurological:     General: No focal deficit present.     Mental Status: She is alert and oriented to person, place, and time.  Psychiatric:        Mood and Affect: Mood normal.        Behavior: Behavior normal.        Thought Content: Thought content normal.        Judgment: Judgment normal.      Assessment and Plan :   1. Laceration of left forearm, initial encounter     Patient is in need of  more involved suture repair than we can provide in the urgent care setting.  She was given Tylenol in clinic, transported to the [facility] emergency room by wheelchair.   Jaynee Eagles,  [place] 07/18/19 1757

## 2019-07-18 NOTE — ED Notes (Signed)
Md at bedside to suture.

## 2019-07-18 NOTE — ED Triage Notes (Signed)
Patient tripped and fell up against the brick house, which ripped her skin.

## 2020-01-01 ENCOUNTER — Encounter: Payer: Self-pay | Admitting: *Deleted

## 2020-01-01 ENCOUNTER — Ambulatory Visit: Payer: Medicare Other | Admitting: Dermatology

## 2020-01-01 ENCOUNTER — Encounter: Payer: Self-pay | Admitting: Dermatology

## 2020-01-01 ENCOUNTER — Other Ambulatory Visit: Payer: Self-pay

## 2020-01-01 DIAGNOSIS — L821 Other seborrheic keratosis: Secondary | ICD-10-CM

## 2020-01-01 DIAGNOSIS — Z1283 Encounter for screening for malignant neoplasm of skin: Secondary | ICD-10-CM | POA: Diagnosis not present

## 2020-01-01 DIAGNOSIS — L720 Epidermal cyst: Secondary | ICD-10-CM | POA: Diagnosis not present

## 2020-01-01 DIAGNOSIS — L565 Disseminated superficial actinic porokeratosis (DSAP): Secondary | ICD-10-CM | POA: Diagnosis not present

## 2020-01-01 NOTE — Patient Instructions (Addendum)
Follow-up for Christina [name] date of birth 10/07/1934.  Examination of the arms, legs, upper chest, back, head and neck shows no atypical moles or melanoma.  The flat tan keratoses on the right leg fit benign keratoses and need no treatment.  On the left shin there  is a 5 mm hornlike lesion that may be DSAP and was treated with 5-second liquid nitrogen freeze.  There is also a 5 mm epidermoid cyst 1 cm outside of the right corner of the mouth; I explained that this is a minor surgical excision but that Medicare may choose not to cover this and in any event is a totally elective removal.  If [name] wants the cyst remove, she will schedule for 30-minute surgery.  Otherwise her exam was remarkable for how healthy and young most of her skin looks. DSAP and was treated with 5-second liquid nitrogen spray.  She understands that this will take several weeks to heal and if the freezing fails we can consider biopsy.

## 2020-01-08 ENCOUNTER — Other Ambulatory Visit: Payer: Self-pay | Admitting: Internal Medicine

## 2020-01-08 DIAGNOSIS — Z1231 Encounter for screening mammogram for malignant neoplasm of breast: Secondary | ICD-10-CM

## 2020-02-01 NOTE — Progress Notes (Signed)
° °  Follow-Up Visit   Subjective  Christina [name] is a 85 y.o. female who presents for the following: Skin Problem (on both arms and legs--noticed for months--check spot on right side of month--noticed for months).  Skin exam Location:  Duration:  Quality:  Associated Signs/Symptoms: Modifying Factors:  Severity:  Timing: Context:   Objective  Well appearing patient in no apparent distress; mood and affect are within normal limits.  A full examination was performed including scalp, head, eyes, ears, nose, lips, neck, chest, axillae, abdomen, back, buttocks, bilateral upper extremities, bilateral lower extremities, hands, feet, fingers, toes, fingernails, and toenails. All findings within normal limits unless otherwise noted below.   Assessment & Plan     Follow-up for Christina [name] date of birth December 20, 1934.  Examination of the arms, legs, upper chest, back, head and neck shows no atypical moles or melanoma.  The flat tan keratoses on the right leg fit benign keratoses and need no treatment.  On the left shin there  is a 5 mm hornlike lesion that may be DSAP and was treated with 5-second liquid nitrogen freeze.  There is also a 5 mm epidermoid cyst 1 cm outside of the right corner of the mouth; I explained that this is a minor surgical excision but that Medicare may choose not to cover this and in any event is a totally elective removal.  If Christina [name] wants the cyst remove, she will schedule for 30-minute surgery.  Otherwise her exam was remarkable for how healthy and young most of her skin looks. DSAP and was treated with 5-second liquid nitrogen spray.  She understands that this will take several weeks to heal and if the freezing fails we can consider biopsy.    Screening for malignant neoplasm of skin Mid Back  No atypical moles or non mole skin cancers. Follow up yearly skin exam.   Seborrheic keratosis Right Thigh - Anterior  No treatment needed.   DSAP (disseminated  superficial actinic porokeratosis) Left Lower Leg - Anterior  Destruction of lesion - Left Lower Leg - Anterior Complexity: simple   Destruction method: cryotherapy   Informed consent: discussed and consent obtained   Timeout:  patient name, date of birth, surgical site, and procedure verified Lesion destroyed using liquid nitrogen: Yes   Cryotherapy cycles:  5 Outcome: patient tolerated procedure well with no complications   Post-procedure details: wound care instructions given    Epidermal cyst Right Oral Commissure  Benign lesion patient may chose to have spot removed but will need a surgery appointment.      I, Lavonna Monarch, MD, have reviewed all documentation for this visit.  The documentation on 02/03/20 for the exam, diagnosis, procedures, and orders are all accurate and complete.

## 2020-02-03 ENCOUNTER — Encounter: Payer: Self-pay | Admitting: Dermatology

## 2020-02-12 ENCOUNTER — Other Ambulatory Visit: Payer: Self-pay

## 2020-02-12 ENCOUNTER — Ambulatory Visit
Admission: RE | Admit: 2020-02-12 | Discharge: 2020-02-12 | Disposition: A | Discharge status: Home / Self Care | Payer: Medicare Other | Source: Ambulatory Visit | Attending: Internal Medicine | Admitting: Internal Medicine

## 2020-02-12 DIAGNOSIS — Z1231 Encounter for screening mammogram for malignant neoplasm of breast: Secondary | ICD-10-CM

## 2020-04-08 ENCOUNTER — Encounter: Payer: Self-pay | Admitting: Internal Medicine

## 2020-04-09 ENCOUNTER — Other Ambulatory Visit: Payer: Self-pay

## 2020-04-09 ENCOUNTER — Ambulatory Visit: Payer: Medicare Other | Admitting: Internal Medicine

## 2020-04-09 ENCOUNTER — Encounter: Payer: Self-pay | Admitting: Internal Medicine

## 2020-04-09 VITALS — BP 112/68 | HR 62 | Ht 63.5 in | Wt 129.4 lb

## 2020-04-09 DIAGNOSIS — E782 Mixed hyperlipidemia: Secondary | ICD-10-CM | POA: Diagnosis not present

## 2020-04-09 DIAGNOSIS — I200 Unstable angina: Secondary | ICD-10-CM

## 2020-04-09 DIAGNOSIS — I2510 Atherosclerotic heart disease of native coronary artery without angina pectoris: Secondary | ICD-10-CM | POA: Diagnosis not present
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
## 2020-04-09 NOTE — Patient Instructions (Addendum)
Medication Instructions:  Your physician recommends that you continue on your current medications as directed. Please refer to the Current Medication list given to you today.  *If you need a refill on your cardiac medications before your next appointment, please call your pharmacy*   Lab Work: BMET:  Before Cardiac CT (They will tell you when to come and have this done)  If you have labs (blood work) drawn today and your tests are completely normal, you will receive your results only by: Marland Kitchen MyChart Message (if you have MyChart) OR . A paper copy in the mail If you have any lab test that is abnormal or we need to change your treatment, we will call you to review the results.   Testing/Procedures: Your physician has requested that you have an echocardiogram. Echocardiography is a painless test that uses sound waves to create images of your heart. It provides your doctor with information about the size and shape of your heart and how well your heart's chambers and valves are working. This procedure takes approximately one hour. There are no restrictions for this procedure.   Your physician has requested that you have cardiac CT. Cardiac computed tomography (CT) is a painless test that uses an x-ray machine to take clear, detailed pictures of your heart. For further information please visit [email or web address]. Please follow instruction sheet as BELOW:  Your cardiac CT will be scheduled at one of the below locations:   [facility] [address] [place], [place] [address] [phone]  At [facility], please arrive at the [facility] main entrance of [facility] 30 minutes prior to test start time. Proceed to the [facility] Radiology Department (first floor) to check-in and test prep.  Please follow these instructions carefully (unless otherwise directed):  On the Night Before the Test: . Be sure to Drink plenty of water. . Do not consume any  caffeinated/decaffeinated beverages or chocolate 12 hours prior to your test. . Do not take any antihistamines 12 hours prior to your test.   On the Day of the Test: . Drink plenty of water. Do not drink any water within one hour of the test. . Do not eat any food 4 hours prior to the test. . You may take your regular medications prior to the test.  . FEMALES- please wear underwire-free bra if available       After the Test: . Drink plenty of water. . After receiving IV contrast, you may experience a mild flushed feeling. This is normal. . On occasion, you may experience a mild rash up to 24 hours after the test. This is not dangerous. If this occurs, you can take Benadryl 25 mg and increase your fluid intake. . If you experience trouble breathing, this can be serious. If it is severe call 911 IMMEDIATELY. If it is mild, please call our office. . If you take any of these medications: Glipizide/Metformin, Avandament, Glucavance, please do not take 48 hours after completing test unless otherwise instructed.   Once we have confirmed authorization from your insurance company, we will call you to set up a date and time for your test. Based on how quickly your insurance processes prior authorizations requests, please allow up to 4 weeks to be contacted for scheduling your Cardiac CT appointment. Be advised that routine Cardiac CT appointments could be scheduled as many as 8 weeks after your provider has ordered it.  For non-scheduling related questions, please contact the cardiac imaging nurse navigator  should you have any questions/concerns: Marchia Bond, Cardiac Imaging Nurse Navigator Burley Saver, Interim Cardiac Imaging Nurse Navigator [facility] Direct Office Dial: [phone]   For scheduling needs, including cancellations and rescheduling, please call [place], [phone].    Follow-Up: At [facility], you and your health needs are our priority.  As  part of our continuing mission to provide you with exceptional heart care, we have created designated Provider Care Teams.  These Care Teams include your primary Cardiologist (physician) and Advanced Practice Providers (APPs -  Physician Assistants and Nurse Practitioners) who all work together to provide you with the care you need, when you need it.  We recommend signing up for the patient portal called "MyChart".  Sign up information is provided on this After Visit Summary.  MyChart is used to connect with patients for Virtual Visits (Telemedicine).  Patients are able to view lab/test results, encounter notes, upcoming appointments, etc.  Non-urgent messages can be sent to your provider as well.   To learn more about what you can do with MyChart, go to [email or web address].    Your next appointment:   3 month(s)  The format for your next appointment:   In Person  Provider:   Rudean Haskell, MD   Other Instructions  Echocardiogram An echocardiogram is a procedure that uses painless sound waves (ultrasound) to produce an image of the heart. Images from an echocardiogram can provide important information about:  Signs of coronary artery disease (CAD).  Aneurysm detection. An aneurysm is a weak or damaged part of an artery wall that bulges out from the normal force of blood pumping through the body.  Heart size and shape. Changes in the size or shape of the heart can be associated with certain conditions, including heart failure, aneurysm, and CAD.  Heart muscle function.  Heart valve function.  Signs of a past heart attack.  Fluid buildup around the heart.  Thickening of the heart muscle.  A tumor or infectious growth around the heart valves. Tell a health care provider about:  Any allergies you have.  All medicines you are taking, including vitamins, herbs, eye drops, creams, and over-the-counter medicines.  Any blood disorders you have.  Any surgeries you have  had.  Any medical conditions you have.  Whether you are pregnant or may be pregnant. What are the risks? Generally, this is a safe procedure. However, problems may occur, including:  Allergic reaction to dye (contrast) that may be used during the procedure. What happens before the procedure? No specific preparation is needed. You may eat and drink normally. What happens during the procedure?   An IV tube may be inserted into one of your veins.  You may receive contrast through this tube. A contrast is an injection that improves the quality of the pictures from your heart.  A gel will be applied to your chest.  A wand-like tool (transducer) will be moved over your chest. The gel will help to transmit the sound waves from the transducer.  The sound waves will harmlessly bounce off of your heart to allow the heart images to be captured in real-time motion. The images will be recorded on a computer. The procedure may vary among health care providers and hospitals. What happens after the procedure?  You may return to your normal, everyday life, including diet, activities, and medicines, unless your health care provider tells you not to do that. Summary  An echocardiogram is a procedure that uses painless sound waves (  ultrasound) to produce an image of the heart.  Images from an echocardiogram can provide important information about the size and shape of your heart, heart muscle function, heart valve function, and fluid buildup around your heart.  You do not need to do anything to prepare before this procedure. You may eat and drink normally.  After the echocardiogram is completed, you may return to your normal, everyday life, unless your health care provider tells you not to do that. This information is not intended to replace advice given to you by your health care provider. Make sure you discuss any questions you have with your health care provider. Document Revised: 07/21/2018  Document Reviewed: 05/02/2016 Elsevier Patient Education  [place].

## 2020-04-15 ENCOUNTER — Telehealth: Payer: Self-pay | Admitting: *Deleted

## 2020-04-15 NOTE — Telephone Encounter (Signed)
-----   Message from Lorrin Jackson sent at 04/15/2020  3:04 PM EST ----- Regarding: ct heart Scheduled 1/13 @ 12:15  Pt will need labs done for this exam.  Thanks, [place]

## 2020-04-15 NOTE — Telephone Encounter (Signed)
Call placed to pt regarding needing a BMET prior to her Cardiac CT 04/25/2020.  Left a detailed message on cell phone to come to the office 04/22/2020 anytime after 7:30 a.m.

## 2020-04-17 NOTE — Telephone Encounter (Signed)
Spoke to pt and she has been made aware to come in 04/22/20 for her BMET for the Cardiac CT.

## 2020-04-22 ENCOUNTER — Other Ambulatory Visit: Payer: Medicare Other | Admitting: *Deleted

## 2020-04-22 ENCOUNTER — Other Ambulatory Visit: Payer: Self-pay

## 2020-04-22 DIAGNOSIS — I200 Unstable angina: Secondary | ICD-10-CM

## 2020-04-22 DIAGNOSIS — I2510 Atherosclerotic heart disease of native coronary artery without angina pectoris: Secondary | ICD-10-CM

## 2020-04-22 LAB — BASIC METABOLIC PANEL
BUN/Creatinine Ratio: 14 (ref 12–28)
BUN: 10 mg/dL (ref 8–27)
CO2: 22 mmol/L (ref 20–29)
Calcium: 9.6 mg/dL (ref 8.7–10.3)
Chloride: 97 mmol/L (ref 96–106)
Creatinine, Ser: 0.72 mg/dL (ref 0.57–1.00)
GFR calc Af Amer: 90 mL/min/{1.73_m2} (ref >59)
GFR calc non Af Amer: 78 mL/min/{1.73_m2} (ref >59)
Glucose: 164 mg/dL — ABNORMAL HIGH (ref 65–99)
Potassium: 4.6 mmol/L (ref 3.5–5.2)
Sodium: 135 mmol/L (ref 134–144)

## 2020-04-24 ENCOUNTER — Telehealth (HOSPITAL_COMMUNITY): Payer: Self-pay | Admitting: Emergency Medicine

## 2020-04-24 NOTE — Telephone Encounter (Signed)
no answer on the phone, no answering machine  Marchia Bond RN Navigator Cardiac Imaging [facility] [phone] Office  [phone] Cell

## 2020-04-24 NOTE — Telephone Encounter (Signed)
Pt returning phone call regarding upcoming cardiac imaging study; pt verbalizes understanding of appt date/time, parking situation and where to check in, pre-test NPO status and medications ordered, and verified current allergies; name and call back number provided for further questions should they arise Phelan Schadt RN Navigator Cardiac Imaging [facility] [phone] office [phone] cell   

## 2020-04-25 ENCOUNTER — Ambulatory Visit (HOSPITAL_COMMUNITY)
Admission: RE | Admit: 2020-04-25 | Discharge: 2020-04-25 | Disposition: A | Discharge status: Home / Self Care | Payer: Medicare Other | Source: Ambulatory Visit | Attending: Internal Medicine | Admitting: Internal Medicine

## 2020-04-25 ENCOUNTER — Other Ambulatory Visit: Payer: Self-pay

## 2020-04-25 ENCOUNTER — Encounter (HOSPITAL_COMMUNITY): Payer: Self-pay

## 2020-04-25 DIAGNOSIS — I25110 Atherosclerotic heart disease of native coronary artery with unstable angina pectoris: Secondary | ICD-10-CM | POA: Insufficient documentation

## 2020-04-25 DIAGNOSIS — I200 Unstable angina: Secondary | ICD-10-CM

## 2020-04-25 DIAGNOSIS — I700 Atherosclerosis of aorta: Secondary | ICD-10-CM | POA: Insufficient documentation

## 2020-04-25 DIAGNOSIS — K449 Diaphragmatic hernia without obstruction or gangrene: Secondary | ICD-10-CM | POA: Diagnosis not present

## 2020-04-25 DIAGNOSIS — I2584 Coronary atherosclerosis due to calcified coronary lesion: Secondary | ICD-10-CM | POA: Diagnosis not present

## 2020-04-25 MED ORDER — NITROGLYCERIN 0.4 MG SL SUBL
0.8000 mg | SUBLINGUAL_TABLET | Freq: Once | SUBLINGUAL | Status: AC
  Administered 2020-04-25: 0.8 mg via SUBLINGUAL

## 2020-04-25 MED ORDER — IOHEXOL 350 MG/ML SOLN
80.0000 mL | Freq: Once | INTRAVENOUS | Status: AC | PRN
  Administered 2020-04-25: 80 mL via INTRAVENOUS

## 2020-04-25 MED ORDER — NITROGLYCERIN 0.4 MG SL SUBL
SUBLINGUAL_TABLET | SUBLINGUAL | Status: AC
  Filled 2020-04-25: qty 2

## 2020-04-29 DIAGNOSIS — R931 Abnormal findings on diagnostic imaging of heart and coronary circulation: Secondary | ICD-10-CM

## 2020-04-29 DIAGNOSIS — I200 Unstable angina: Secondary | ICD-10-CM | POA: Diagnosis not present

## 2020-05-01 ENCOUNTER — Telehealth: Payer: Self-pay | Admitting: Internal Medicine

## 2020-05-01 NOTE — Telephone Encounter (Signed)
Patient is under the impression that her appointment 05/13/20 at 2:40 pm is for a heart cath at [facility]. Patient was not sure why an echo would need to be done so close to what she believes is a heart cath.  Tried to reassure patient that the 05/13/20 appointment was a regular office visit but she did not understand. She wanted to talk to a Nurse to get more information. Please assist

## 2020-05-01 NOTE — Telephone Encounter (Signed)
Educated the patient about her up coming appointments. The difference between a CT and Echo. Discussed her appointment on the 1/31 with Dr. Gasper Sells and this is for medical optimization and discussion of cath.  Patient verbalized understanding.

## 2020-05-06 ENCOUNTER — Ambulatory Visit (HOSPITAL_COMMUNITY): Payer: Medicare Other | Attending: Cardiology

## 2020-05-06 ENCOUNTER — Telehealth: Payer: Self-pay

## 2020-05-06 ENCOUNTER — Other Ambulatory Visit: Payer: Self-pay

## 2020-05-06 DIAGNOSIS — I2510 Atherosclerotic heart disease of native coronary artery without angina pectoris: Secondary | ICD-10-CM | POA: Insufficient documentation

## 2020-05-06 DIAGNOSIS — I200 Unstable angina: Secondary | ICD-10-CM | POA: Diagnosis not present

## 2020-05-06 LAB — ECHOCARDIOGRAM COMPLETE
Area-P 1/2: 3.43 cm2
S' Lateral: 3.2 cm

## 2020-05-06 NOTE — Telephone Encounter (Signed)
-----   Message from Werner Lean, MD sent at 05/06/2020  1:58 PM EST ----- Results: No WMA Plan: 05/13/20 visit for medical mgmt vs cath  Werner Lean, MD

## 2020-05-06 NOTE — Telephone Encounter (Signed)
Attempted to call patient regarding Echo results. No answer, unable to leave a message due to voicemail not being set up.

## 2020-05-13 ENCOUNTER — Encounter: Payer: Self-pay | Admitting: Internal Medicine

## 2020-05-13 ENCOUNTER — Ambulatory Visit: Payer: Medicare Other | Admitting: Internal Medicine

## 2020-05-13 ENCOUNTER — Other Ambulatory Visit: Payer: Self-pay

## 2020-05-13 VITALS — BP 134/70 | HR 69 | Ht 63.5 in | Wt 130.6 lb

## 2020-05-13 DIAGNOSIS — I10 Essential (primary) hypertension: Secondary | ICD-10-CM

## 2020-05-13 DIAGNOSIS — I2510 Atherosclerotic heart disease of native coronary artery without angina pectoris: Secondary | ICD-10-CM | POA: Diagnosis not present

## 2020-05-13 DIAGNOSIS — R9389 Abnormal findings on diagnostic imaging of other specified body structures: Secondary | ICD-10-CM | POA: Diagnosis not present

## 2020-05-13 DIAGNOSIS — E782 Mixed hyperlipidemia: Secondary | ICD-10-CM | POA: Diagnosis not present

## 2020-05-13 DIAGNOSIS — E119 Type 2 diabetes mellitus without complications: Secondary | ICD-10-CM

## 2020-05-13 DIAGNOSIS — R079 Chest pain, unspecified: Secondary | ICD-10-CM | POA: Diagnosis not present

## 2020-05-13 DIAGNOSIS — I2584 Coronary atherosclerosis due to calcified coronary lesion: Secondary | ICD-10-CM

## 2020-05-13 LAB — CBC
Hematocrit: 41.9 % (ref 34.0–46.6)
Hemoglobin: 14.4 g/dL (ref 11.1–15.9)
MCH: 31.3 pg (ref 26.6–33.0)
MCHC: 34.4 g/dL (ref 31.5–35.7)
MCV: 91 fL (ref 79–97)
Platelets: 201 10*3/uL (ref 150–450)
RBC: 4.6 x10E6/uL (ref 3.77–5.28)
RDW: 12.4 % (ref 11.7–15.4)
WBC: 9.8 10*3/uL (ref 3.4–10.8)

## 2020-05-13 LAB — BASIC METABOLIC PANEL
BUN/Creatinine Ratio: 22 (ref 12–28)
BUN: 13 mg/dL (ref 8–27)
CO2: 28 mmol/L (ref 20–29)
Calcium: 10.2 mg/dL (ref 8.7–10.3)
Chloride: 95 mmol/L — ABNORMAL LOW (ref 96–106)
Creatinine, Ser: 0.6 mg/dL (ref 0.57–1.00)
GFR calc Af Amer: 97 mL/min/{1.73_m2} (ref >59)
GFR calc non Af Amer: 85 mL/min/{1.73_m2} (ref >59)
Glucose: 171 mg/dL — ABNORMAL HIGH (ref 65–99)
Potassium: 4.6 mmol/L (ref 3.5–5.2)
Sodium: 131 mmol/L — ABNORMAL LOW (ref 134–144)
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
## 2020-05-13 NOTE — Patient Instructions (Signed)
Medication Instructions:  Your physician recommends that you continue on your current medications as directed. Please refer to the Current Medication list given to you today.  *If you need a refill on your cardiac medications before your next appointment, please call your pharmacy*   Lab Work: BMET and CBC today  If you have labs (blood work) drawn today and your tests are completely normal, you will receive your results only by: Marland Kitchen MyChart Message (if you have MyChart) OR . A paper copy in the mail If you have any lab test that is abnormal or we need to change your treatment, we will call you to review the results.   Testing/Procedures: Your physician has requested that you have a cardiac catheterization. Cardiac catheterization is used to diagnose and/or treat various heart conditions. Doctors may recommend this procedure for a number of different reasons. The most common reason is to evaluate chest pain. Chest pain can be a symptom of coronary artery disease (CAD), and cardiac catheterization can show whether plaque is narrowing or blocking your heart's arteries. This procedure is also used to evaluate the valves, as well as measure the blood flow and oxygen levels in different parts of your heart. For further information please visit HugeFiesta.tn. Please follow instruction sheet, as given.    Follow-Up: At [facility], you and your health needs are our priority.  As part of our continuing mission to provide you with exceptional heart care, we have created designated Provider Care Teams.  These Care Teams include your primary Cardiologist (physician) and Advanced Practice Providers (APPs -  Physician Assistants and Nurse Practitioners) who all work together to provide you with the care you need, when you need it.  We recommend signing up for the patient portal called "MyChart".  Sign up information is provided on this After Visit Summary.  MyChart is used to connect with patients  for Virtual Visits (Telemedicine).  Patients are able to view lab/test results, encounter notes, upcoming appointments, etc.  Non-urgent messages can be sent to your provider as well.   To learn more about what you can do with MyChart, go to [email or web address].    Your next appointment:   2-3 week(s) after cath  The format for your next appointment:   In Person  Provider:   You may see Werner Lean, MD or one of the following Advanced Practice Providers on your designated Care Team:    Melina Copa, PA-C  Ermalinda Barrios, PA-C    Other Instructions  Due to recent COVID-19 restrictions implemented by our local and state authorities and in an effort to keep both patients and staff as safe as possible, our hospital system requires COVID-19 testing prior to certain scheduled hospital procedures.  Please go to [place]. [place], [place] [address] on 05/18/20 at 11:05am.  This is a drive up testing site.  You will not need to exit your vehicle.  You must agree to self-quarantine from the time of your testing until the procedure date on 05/20/20.  This should included staying home with ONLY the people you live with.  Avoid take-out, grocery store shopping or leaving the house for any non-emergent reason.  Failure to have your COVID-19 test done on the date and time you have been scheduled will result in cancellation of your procedure.  Please call our office at [phone] if you have any questions.     [place] OFFICE [place],  [address] [place] [address] Dept: [phone] Loc: [place]  05/13/2020  You are scheduled for a Cardiac Catheterization on Monday, February 7 with Dr. [name].  1. Please arrive at the [facility] (Main Entrance A) at [facility]: [address] [place], [place] [address] at 5:30 AM (This time is two hours before your  procedure to ensure your preparation). Free valet parking service is available.   Special note: Every effort is made to have your procedure done on time. Please understand that emergencies sometimes delay scheduled procedures.  2. Diet: Do not eat solid foods after midnight.  The patient may have clear liquids until 5am upon the day of the procedure.  3. Labs: You will have labs drawn today.  4. Medication instructions in preparation for your procedure:   Contrast Allergy: No  On the morning of your procedure, take your Aspirin and any morning medicines NOT listed above.  You may use sips of water.  5. Plan for one night stay--bring personal belongings. 6. Bring a current list of your medications and current insurance cards. 7. You MUST have a responsible person to drive you home. 8. Someone MUST be with you the first 24 hours after you arrive home or your discharge will be delayed. 9. Please wear clothes that are easy to get on and off and wear slip-on shoes.  Thank you for allowing [place] to care for you!   -- [place] Invasive Cardiovascular services

## 2020-05-14 NOTE — Addendum Note (Signed)
Addended by: Briant Cedar on: 05/14/2020 04:50 PM   Modules accepted: Orders

## 2020-05-16 ENCOUNTER — Telehealth: Payer: Self-pay | Admitting: *Deleted

## 2020-05-16 NOTE — Telephone Encounter (Signed)
Pt contacted pre-catheterization scheduled at [facility] for: Monday May 20, 2020 7:30 AM Verified arrival time and place: [facility]) at: 5:30 AM   No solid food after midnight prior to cath, clear liquids until 5 AM day of procedure.  AM meds can be  taken pre-cath with sips of water including: ASA 81 mg   Confirmed patient has responsible adult to drive home post procedure and be with patient first 24 hours after arriving home: yes  You are allowed ONE visitor in the waiting room during the time you are at the hospital for your procedure. Both you and your visitor must wear a mask once you enter the hospital.   Reviewed procedure/mask/visitor instructions with patient.

## 2020-05-18 ENCOUNTER — Other Ambulatory Visit (HOSPITAL_COMMUNITY)
Admission: RE | Admit: 2020-05-18 | Discharge: 2020-05-18 | Disposition: A | Discharge status: Home / Self Care | Payer: Medicare Other | Source: Ambulatory Visit | Attending: Cardiovascular Disease | Admitting: Cardiovascular Disease

## 2020-05-18 DIAGNOSIS — Z01812 Encounter for preprocedural laboratory examination: Secondary | ICD-10-CM | POA: Insufficient documentation

## 2020-05-18 DIAGNOSIS — Z20822 Contact with and (suspected) exposure to covid-19: Secondary | ICD-10-CM | POA: Insufficient documentation

## 2020-05-18 LAB — SARS CORONAVIRUS 2 (TAT 6-24 HRS): SARS Coronavirus 2: NEGATIVE

## 2020-05-20 ENCOUNTER — Encounter (HOSPITAL_COMMUNITY): Payer: Self-pay | Admitting: Cardiovascular Disease

## 2020-05-20 ENCOUNTER — Inpatient Hospital Stay (HOSPITAL_COMMUNITY)
Admission: RE | Admit: 2020-05-20 | Discharge: 2020-05-22 | DRG: 247 | Disposition: A | Discharge status: Home / Self Care | Payer: Medicare Other | Attending: Interventional Cardiology | Admitting: Interventional Cardiology

## 2020-05-20 ENCOUNTER — Inpatient Hospital Stay (HOSPITAL_COMMUNITY)
Admission: RE | Disposition: A | Discharge status: Home / Self Care | Payer: Self-pay | Source: Home / Self Care | Attending: Cardiovascular Disease

## 2020-05-20 ENCOUNTER — Other Ambulatory Visit: Payer: Self-pay

## 2020-05-20 DIAGNOSIS — R079 Chest pain, unspecified: Secondary | ICD-10-CM

## 2020-05-20 DIAGNOSIS — Z79890 Hormone replacement therapy (postmenopausal): Secondary | ICD-10-CM | POA: Diagnosis not present

## 2020-05-20 DIAGNOSIS — Z833 Family history of diabetes mellitus: Secondary | ICD-10-CM | POA: Diagnosis not present

## 2020-05-20 DIAGNOSIS — Z8249 Family history of ischemic heart disease and other diseases of the circulatory system: Secondary | ICD-10-CM

## 2020-05-20 DIAGNOSIS — G4700 Insomnia, unspecified: Secondary | ICD-10-CM | POA: Diagnosis present

## 2020-05-20 DIAGNOSIS — R9389 Abnormal findings on diagnostic imaging of other specified body structures: Secondary | ICD-10-CM

## 2020-05-20 DIAGNOSIS — E559 Vitamin D deficiency, unspecified: Secondary | ICD-10-CM | POA: Diagnosis present

## 2020-05-20 DIAGNOSIS — E039 Hypothyroidism, unspecified: Secondary | ICD-10-CM | POA: Diagnosis present

## 2020-05-20 DIAGNOSIS — Z86010 Personal history of colonic polyps: Secondary | ICD-10-CM

## 2020-05-20 DIAGNOSIS — E119 Type 2 diabetes mellitus without complications: Secondary | ICD-10-CM | POA: Diagnosis present

## 2020-05-20 DIAGNOSIS — F32A Depression, unspecified: Secondary | ICD-10-CM | POA: Diagnosis present

## 2020-05-20 DIAGNOSIS — I25110 Atherosclerotic heart disease of native coronary artery with unstable angina pectoris: Secondary | ICD-10-CM | POA: Diagnosis present

## 2020-05-20 DIAGNOSIS — I2510 Atherosclerotic heart disease of native coronary artery without angina pectoris: Secondary | ICD-10-CM

## 2020-05-20 DIAGNOSIS — Y848 Other medical procedures as the cause of abnormal reaction of the patient, or of later complication, without mention of misadventure at the time of the procedure: Secondary | ICD-10-CM | POA: Diagnosis not present

## 2020-05-20 DIAGNOSIS — I200 Unstable angina: Secondary | ICD-10-CM | POA: Diagnosis not present

## 2020-05-20 DIAGNOSIS — Z20822 Contact with and (suspected) exposure to covid-19: Secondary | ICD-10-CM | POA: Diagnosis present

## 2020-05-20 DIAGNOSIS — E782 Mixed hyperlipidemia: Secondary | ICD-10-CM | POA: Diagnosis present

## 2020-05-20 DIAGNOSIS — Z90710 Acquired absence of both cervix and uterus: Secondary | ICD-10-CM

## 2020-05-20 DIAGNOSIS — S5011XA Contusion of right forearm, initial encounter: Secondary | ICD-10-CM | POA: Diagnosis not present

## 2020-05-20 DIAGNOSIS — I10 Essential (primary) hypertension: Secondary | ICD-10-CM | POA: Diagnosis present

## 2020-05-20 DIAGNOSIS — Z7982 Long term (current) use of aspirin: Secondary | ICD-10-CM | POA: Diagnosis not present

## 2020-05-20 DIAGNOSIS — Z888 Allergy status to other drugs, medicaments and biological substances status: Secondary | ICD-10-CM | POA: Diagnosis not present

## 2020-05-20 DIAGNOSIS — R001 Bradycardia, unspecified: Secondary | ICD-10-CM | POA: Diagnosis present

## 2020-05-20 DIAGNOSIS — Z885 Allergy status to narcotic agent status: Secondary | ICD-10-CM

## 2020-05-20 DIAGNOSIS — Z85828 Personal history of other malignant neoplasm of skin: Secondary | ICD-10-CM | POA: Diagnosis not present

## 2020-05-20 DIAGNOSIS — R0600 Dyspnea, unspecified: Secondary | ICD-10-CM | POA: Diagnosis present

## 2020-05-20 DIAGNOSIS — Z79899 Other long term (current) drug therapy: Secondary | ICD-10-CM | POA: Diagnosis not present

## 2020-05-20 DIAGNOSIS — I25119 Atherosclerotic heart disease of native coronary artery with unspecified angina pectoris: Secondary | ICD-10-CM | POA: Diagnosis not present

## 2020-05-20 DIAGNOSIS — Z955 Presence of coronary angioplasty implant and graft: Secondary | ICD-10-CM

## 2020-05-20 HISTORY — PX: LEFT HEART CATH AND CORONARY ANGIOGRAPHY: CATH118249

## 2020-05-20 LAB — GLUCOSE, CAPILLARY: Glucose-Capillary: 142 mg/dL — ABNORMAL HIGH (ref 70–99)

## 2020-05-20 SURGERY — LEFT HEART CATH AND CORONARY ANGIOGRAPHY
Anesthesia: LOCAL

## 2020-05-20 MED ORDER — LEVOTHYROXINE SODIUM 50 MCG PO TABS
50.0000 ug | ORAL_TABLET | Freq: Every day | ORAL | Status: DC
  Administered 2020-05-21 – 2020-05-22 (×2): 50 ug via ORAL
  Filled 2020-05-20 (×2): qty 1

## 2020-05-20 MED ORDER — NITROGLYCERIN 0.4 MG SL SUBL: 0.4000 mg | SUBLINGUAL_TABLET | SUBLINGUAL | Status: DC | PRN

## 2020-05-20 MED ORDER — SODIUM CHLORIDE 0.9 % IV SOLN: 250.0000 mL | INTRAVENOUS | Status: DC | PRN

## 2020-05-20 MED ORDER — HEPARIN (PORCINE) IN NACL 1000-0.9 UT/500ML-% IV SOLN
INTRAVENOUS | Status: AC
  Filled 2020-05-20: qty 1000

## 2020-05-20 MED ORDER — EZETIMIBE 10 MG PO TABS
10.0000 mg | ORAL_TABLET | Freq: Every day | ORAL | Status: DC
  Administered 2020-05-20 – 2020-05-22 (×3): 10 mg via ORAL
  Filled 2020-05-20 (×3): qty 1

## 2020-05-20 MED ORDER — SODIUM CHLORIDE 0.9% FLUSH
3.0000 mL | Freq: Two times a day (BID) | INTRAVENOUS | Status: DC
  Administered 2020-05-20 – 2020-05-21 (×2): 3 mL via INTRAVENOUS

## 2020-05-20 MED ORDER — SODIUM CHLORIDE 0.9 % WEIGHT BASED INFUSION
3.0000 mL/kg/h | INTRAVENOUS | Status: DC
  Administered 2020-05-20: 3 mL/kg/h via INTRAVENOUS

## 2020-05-20 MED ORDER — SODIUM CHLORIDE 0.9 % WEIGHT BASED INFUSION: 1.0000 mL/kg/h | INTRAVENOUS | Status: DC

## 2020-05-20 MED ORDER — FENTANYL CITRATE (PF) 100 MCG/2ML IJ SOLN
INTRAMUSCULAR | Status: DC | PRN
  Administered 2020-05-20: 25 ug via INTRAVENOUS

## 2020-05-20 MED ORDER — FENTANYL CITRATE (PF) 100 MCG/2ML IJ SOLN
INTRAMUSCULAR | Status: AC
  Filled 2020-05-20: qty 2

## 2020-05-20 MED ORDER — LABETALOL HCL 5 MG/ML IV SOLN: 10.0000 mg | INTRAVENOUS | Status: AC | PRN

## 2020-05-20 MED ORDER — SODIUM CHLORIDE 0.9% FLUSH: 3.0000 mL | INTRAVENOUS | Status: DC | PRN

## 2020-05-20 MED ORDER — VERAPAMIL HCL 2.5 MG/ML IV SOLN
INTRAVENOUS | Status: AC
  Filled 2020-05-20: qty 2

## 2020-05-20 MED ORDER — HEPARIN SODIUM (PORCINE) 1000 UNIT/ML IJ SOLN
INTRAMUSCULAR | Status: AC
  Filled 2020-05-20: qty 1

## 2020-05-20 MED ORDER — ESCITALOPRAM OXALATE 10 MG PO TABS
10.0000 mg | ORAL_TABLET | Freq: Every day | ORAL | Status: DC
  Administered 2020-05-20 – 2020-05-22 (×3): 10 mg via ORAL
  Filled 2020-05-20 (×3): qty 1

## 2020-05-20 MED ORDER — HYDRALAZINE HCL 20 MG/ML IJ SOLN: 10.0000 mg | INTRAMUSCULAR | Status: AC | PRN

## 2020-05-20 MED ORDER — ASPIRIN 81 MG PO CHEW: 81.0000 mg | CHEWABLE_TABLET | ORAL | Status: DC

## 2020-05-20 MED ORDER — MIDAZOLAM HCL 2 MG/2ML IJ SOLN
INTRAMUSCULAR | Status: DC | PRN
  Administered 2020-05-20: 1 mg via INTRAVENOUS

## 2020-05-20 MED ORDER — LIDOCAINE HCL (PF) 1 % IJ SOLN
INTRAMUSCULAR | Status: DC | PRN
  Administered 2020-05-20: 2 mL
  Administered 2020-05-20: 10 mL

## 2020-05-20 MED ORDER — HEPARIN (PORCINE) IN NACL 1000-0.9 UT/500ML-% IV SOLN
INTRAVENOUS | Status: DC | PRN
  Administered 2020-05-20 (×2): 500 mL

## 2020-05-20 MED ORDER — ONDANSETRON HCL 4 MG/2ML IJ SOLN: 4.0000 mg | Freq: Four times a day (QID) | INTRAMUSCULAR | Status: DC | PRN

## 2020-05-20 MED ORDER — IRBESARTAN 150 MG PO TABS
300.0000 mg | ORAL_TABLET | Freq: Every day | ORAL | Status: DC
  Administered 2020-05-21: 300 mg via ORAL
  Filled 2020-05-20 (×2): qty 2

## 2020-05-20 MED ORDER — HYDRALAZINE HCL 20 MG/ML IJ SOLN
INTRAMUSCULAR | Status: AC
  Filled 2020-05-20: qty 1

## 2020-05-20 MED ORDER — SODIUM CHLORIDE 0.9% FLUSH: 3.0000 mL | Freq: Two times a day (BID) | INTRAVENOUS | Status: DC

## 2020-05-20 MED ORDER — VERAPAMIL HCL 2.5 MG/ML IV SOLN: INTRAVENOUS | Status: DC | PRN

## 2020-05-20 MED ORDER — HYDRALAZINE HCL 20 MG/ML IJ SOLN: 10.0000 mg | Freq: Once | INTRAMUSCULAR | Status: DC

## 2020-05-20 MED ORDER — IOHEXOL 350 MG/ML SOLN
INTRAVENOUS | Status: DC | PRN
  Administered 2020-05-20: 50 mL via INTRA_ARTERIAL

## 2020-05-20 MED ORDER — LIDOCAINE HCL (PF) 1 % IJ SOLN
INTRAMUSCULAR | Status: AC
  Filled 2020-05-20: qty 30

## 2020-05-20 MED ORDER — SODIUM CHLORIDE 0.9 % IV SOLN: INTRAVENOUS | Status: AC

## 2020-05-20 MED ORDER — LORAZEPAM 1 MG PO TABS
2.0000 mg | ORAL_TABLET | Freq: Once | ORAL | Status: AC
  Administered 2020-05-20: 2 mg via ORAL
  Filled 2020-05-20: qty 2

## 2020-05-20 MED ORDER — ACETAMINOPHEN 325 MG PO TABS: 650.0000 mg | ORAL_TABLET | ORAL | Status: DC | PRN

## 2020-05-20 MED ORDER — MIDAZOLAM HCL 2 MG/2ML IJ SOLN
INTRAMUSCULAR | Status: AC
  Filled 2020-05-20: qty 2

## 2020-05-20 SURGICAL SUPPLY — 17 items
BAG SNAP BAND KOVER 36X36 (MISCELLANEOUS) ×1 IMPLANT
CATH 5FR JL3.5 JR4 ANG PIG MP (CATHETERS) IMPLANT
CATH INFINITI 5 FR 3DRC (CATHETERS) ×1 IMPLANT
CATH INFINITI 5FR MULTPACK ANG (CATHETERS) ×1 IMPLANT
CLOSURE MYNX CONTROL 5F (Vascular Products) ×1 IMPLANT
COVER DOME SNAP 22 D (MISCELLANEOUS) ×1 IMPLANT
DEVICE RAD COMP TR BAND LRG (VASCULAR PRODUCTS) ×1 IMPLANT
GLIDESHEATH SLEND SS 6F .021 (SHEATH) ×1 IMPLANT
GUIDEWIRE INQWIRE 1.5J.035X260 (WIRE) IMPLANT
INQWIRE 1.5J .035X260CM (WIRE) ×2
KIT ENCORE 26 ADVANTAGE (KITS) ×1 IMPLANT
KIT HEART LEFT (KITS) ×2 IMPLANT
PACK CARDIAC CATHETERIZATION (CUSTOM PROCEDURE TRAY) ×2 IMPLANT
SHEATH PINNACLE 5F 10CM (SHEATH) ×1 IMPLANT
TRANSDUCER W/STOPCOCK (MISCELLANEOUS) ×2 IMPLANT
TUBING CIL FLEX 10 FLL-RA (TUBING) ×2 IMPLANT
WIRE EMERALD 3MM-J .035X150CM (WIRE) ×1 IMPLANT

## 2020-05-20 NOTE — Progress Notes (Signed)
Blood pressure cuff removed from right forearm. No additional hematoma noted. Forearm is circumferentially swollen, hematoma is not distinct enough to mark.  TR band deflations were started at 09:15:00 SPO2 99% as measured in right thumb.

## 2020-05-20 NOTE — Progress Notes (Addendum)
Right arm hematoma, BP cuff applied per Dr. Angelena Form. Hematoma is mid forearm. There was an area of hematoma just proximal to tr band. Tr band repositioned proximal by 1/4 inch, reinflated to 12cc.  Blood pressure cuff inflated for so that spo2 pleth is still present as measured in right thumb.

## 2020-05-20 NOTE — Interval H&P Note (Signed)
History and Physical Interval Note:  05/20/2020 7:09 AM  Christina [name]  has presented today for surgery, with the diagnosis of abnormal ct - chest pain.  The various methods of treatment have been discussed with the patient and family. After consideration of risks, benefits and other options for treatment, the patient has consented to  Procedure(s): LEFT HEART CATH AND CORONARY ANGIOGRAPHY (N/A) as a surgical intervention.  The patient's history has been reviewed, patient examined, no change in status, stable for surgery.  I have reviewed the patient's chart and labs.  Questions were answered to the patient's satisfaction.    Cath Lab Visit (complete for each Cath Lab visit)  Clinical Evaluation Leading to the Procedure:   ACS: No.  Non-ACS:    Anginal Classification: CCS II  Anti-ischemic medical therapy: Minimal Therapy (1 class of medications)  Non-Invasive Test Results: High-risk stress test findings: cardiac mortality >3%/year (Coronary CTA with possible severe disease)  Prior CABG: No previous CABG        Christina [name]

## 2020-05-21 ENCOUNTER — Ambulatory Visit (HOSPITAL_COMMUNITY): Admission: RE | Admit: 2020-05-21 | Payer: Medicare Other | Source: Home / Self Care | Admitting: Cardiology

## 2020-05-21 ENCOUNTER — Encounter (HOSPITAL_COMMUNITY)
Admission: RE | Disposition: A | Discharge status: Home / Self Care | Payer: Self-pay | Source: Home / Self Care | Attending: Cardiovascular Disease

## 2020-05-21 ENCOUNTER — Encounter (HOSPITAL_COMMUNITY): Payer: Self-pay | Admitting: Cardiology

## 2020-05-21 DIAGNOSIS — I10 Essential (primary) hypertension: Secondary | ICD-10-CM | POA: Diagnosis not present

## 2020-05-21 DIAGNOSIS — I200 Unstable angina: Secondary | ICD-10-CM | POA: Diagnosis not present

## 2020-05-21 DIAGNOSIS — I25119 Atherosclerotic heart disease of native coronary artery with unspecified angina pectoris: Secondary | ICD-10-CM

## 2020-05-21 HISTORY — PX: CORONARY ULTRASOUND/IVUS: CATH118244

## 2020-05-21 HISTORY — PX: CORONARY STENT INTERVENTION: CATH118234

## 2020-05-21 HISTORY — PX: CORONARY ATHERECTOMY: CATH118238

## 2020-05-21 LAB — CBC
HCT: 35.9 % — ABNORMAL LOW (ref 36.0–46.0)
Hemoglobin: 12.6 g/dL (ref 12.0–15.0)
MCH: 32.1 pg (ref 26.0–34.0)
MCHC: 35.1 g/dL (ref 30.0–36.0)
MCV: 91.6 fL (ref 80.0–100.0)
Platelets: 157 10*3/uL (ref 150–400)
RBC: 3.92 MIL/uL (ref 3.87–5.11)
RDW: 12.1 % (ref 11.5–15.5)
WBC: 9.4 10*3/uL (ref 4.0–10.5)
nRBC: 0 % (ref 0.0–0.2)

## 2020-05-21 LAB — BASIC METABOLIC PANEL
Anion gap: 9 (ref 5–15)
BUN: 7 mg/dL — ABNORMAL LOW (ref 8–23)
CO2: 24 mmol/L (ref 22–32)
Calcium: 8.6 mg/dL — ABNORMAL LOW (ref 8.9–10.3)
Chloride: 103 mmol/L (ref 98–111)
Creatinine, Ser: 0.51 mg/dL (ref 0.44–1.00)
GFR, Estimated: >60 mL/min (ref >60)
Glucose, Bld: 117 mg/dL — ABNORMAL HIGH (ref 70–99)
Potassium: 3.7 mmol/L (ref 3.5–5.1)
Sodium: 136 mmol/L (ref 135–145)

## 2020-05-21 LAB — POCT ACTIVATED CLOTTING TIME
Activated Clotting Time: 303 seconds
Activated Clotting Time: 243 seconds
Activated Clotting Time: 136 seconds

## 2020-05-21 SURGERY — CORONARY STENT INTERVENTION
Anesthesia: LOCAL

## 2020-05-21 MED ORDER — CLOPIDOGREL BISULFATE 75 MG PO TABS
75.0000 mg | ORAL_TABLET | Freq: Every day | ORAL | Status: DC
  Administered 2020-05-22: 75 mg via ORAL
  Filled 2020-05-21: qty 1

## 2020-05-21 MED ORDER — HEPARIN (PORCINE) IN NACL 1000-0.9 UT/500ML-% IV SOLN
INTRAVENOUS | Status: DC | PRN
  Administered 2020-05-21 (×3): 500 mL

## 2020-05-21 MED ORDER — ASPIRIN 81 MG PO CHEW
81.0000 mg | CHEWABLE_TABLET | ORAL | Status: AC
  Administered 2020-05-21: 81 mg via ORAL
  Filled 2020-05-21: qty 1

## 2020-05-21 MED ORDER — SODIUM CHLORIDE 0.9% FLUSH: 3.0000 mL | INTRAVENOUS | Status: DC | PRN

## 2020-05-21 MED ORDER — SODIUM CHLORIDE 0.9% FLUSH: 3.0000 mL | Freq: Two times a day (BID) | INTRAVENOUS | Status: DC

## 2020-05-21 MED ORDER — SODIUM CHLORIDE 0.9 % IV SOLN
INTRAVENOUS | Status: AC | PRN
  Administered 2020-05-21: 292.5 mg via INTRAVENOUS

## 2020-05-21 MED ORDER — SODIUM CHLORIDE 0.9 % IV SOLN
INTRAVENOUS | Status: AC | PRN
  Administered 2020-05-21: 300 mL via INTRAVENOUS

## 2020-05-21 MED ORDER — VIPERSLIDE LUBRICANT OPTIME: TOPICAL | Status: DC | PRN

## 2020-05-21 MED ORDER — NITROGLYCERIN 1 MG/10 ML FOR IR/CATH LAB
INTRA_ARTERIAL | Status: DC | PRN
  Administered 2020-05-21 (×2): 200 ug via INTRACORONARY

## 2020-05-21 MED ORDER — SODIUM CHLORIDE 0.9 % WEIGHT BASED INFUSION
3.0000 mL/kg/h | INTRAVENOUS | Status: DC
  Administered 2020-05-21: 3 mL/kg/h via INTRAVENOUS

## 2020-05-21 MED ORDER — IOHEXOL 350 MG/ML SOLN
INTRAVENOUS | Status: DC | PRN
  Administered 2020-05-21: 80 mL via INTRA_ARTERIAL

## 2020-05-21 MED ORDER — LABETALOL HCL 5 MG/ML IV SOLN: 10.0000 mg | INTRAVENOUS | Status: DC | PRN

## 2020-05-21 MED ORDER — FENTANYL CITRATE (PF) 100 MCG/2ML IJ SOLN
INTRAMUSCULAR | Status: DC | PRN
  Administered 2020-05-21 (×2): 25 ug via INTRAVENOUS

## 2020-05-21 MED ORDER — HEPARIN SODIUM (PORCINE) 1000 UNIT/ML IJ SOLN
INTRAMUSCULAR | Status: DC | PRN
  Administered 2020-05-21: 6000 [IU] via INTRAVENOUS

## 2020-05-21 MED ORDER — SODIUM CHLORIDE 0.9 % IV SOLN
5.0000 mg/kg | INTRAVENOUS | Status: DC
  Filled 2020-05-21: qty 11.7

## 2020-05-21 MED ORDER — MIDAZOLAM HCL 2 MG/2ML IJ SOLN
INTRAMUSCULAR | Status: DC | PRN
  Administered 2020-05-21 (×3): 1 mg via INTRAVENOUS

## 2020-05-21 MED ORDER — SODIUM CHLORIDE 0.9 % IV SOLN: 250.0000 mL | INTRAVENOUS | Status: DC | PRN

## 2020-05-21 MED ORDER — HEPARIN (PORCINE) IN NACL 1000-0.9 UT/500ML-% IV SOLN
INTRAVENOUS | Status: AC
  Filled 2020-05-21: qty 1000

## 2020-05-21 MED ORDER — SODIUM CHLORIDE 0.9 % WEIGHT BASED INFUSION
1.0000 mL/kg/h | INTRAVENOUS | Status: AC
  Administered 2020-05-21: 1 mL/kg/h via INTRAVENOUS

## 2020-05-21 MED ORDER — CLOPIDOGREL BISULFATE 75 MG PO TABS
600.0000 mg | ORAL_TABLET | Freq: Once | ORAL | Status: AC
  Administered 2020-05-21: 600 mg via ORAL
  Filled 2020-05-21: qty 8

## 2020-05-21 MED ORDER — LORAZEPAM 1 MG PO TABS
2.0000 mg | ORAL_TABLET | Freq: Once | ORAL | Status: AC
  Administered 2020-05-21: 2 mg via ORAL
  Filled 2020-05-21: qty 2

## 2020-05-21 MED ORDER — IOHEXOL 350 MG/ML SOLN
INTRAVENOUS | Status: AC
  Filled 2020-05-21: qty 1

## 2020-05-21 MED ORDER — LIDOCAINE HCL (PF) 1 % IJ SOLN
INTRAMUSCULAR | Status: AC
  Filled 2020-05-21: qty 30

## 2020-05-21 MED ORDER — SODIUM CHLORIDE 0.9% FLUSH
3.0000 mL | Freq: Two times a day (BID) | INTRAVENOUS | Status: DC
  Administered 2020-05-21 – 2020-05-22 (×2): 3 mL via INTRAVENOUS

## 2020-05-21 MED ORDER — LIDOCAINE HCL (PF) 1 % IJ SOLN
INTRAMUSCULAR | Status: DC | PRN
  Administered 2020-05-21: 20 mL

## 2020-05-21 MED ORDER — SODIUM CHLORIDE 0.9 % IV SOLN
250.0000 mL | INTRAVENOUS | Status: DC | PRN
Start: 2020-05-21 — End: 2020-05-22

## 2020-05-21 MED ORDER — ASPIRIN 81 MG PO CHEW
81.0000 mg | CHEWABLE_TABLET | Freq: Every day | ORAL | Status: DC
  Administered 2020-05-22: 81 mg via ORAL
  Filled 2020-05-21: qty 1

## 2020-05-21 MED ORDER — SODIUM CHLORIDE 0.9 % WEIGHT BASED INFUSION
1.0000 mL/kg/h | INTRAVENOUS | Status: DC
  Administered 2020-05-21: 1 mL/kg/h via INTRAVENOUS

## 2020-05-21 MED FILL — Verapamil HCl IV Soln 2.5 MG/ML: INTRAVENOUS | Qty: 2 | Status: AC

## 2020-05-21 SURGICAL SUPPLY — 19 items
BALLN SAPPHIRE 2.5X12 (BALLOONS) ×2
BALLN ~~LOC~~ SAPPHIRE 4.5X8 (BALLOONS) ×2
BALLOON SAPPHIRE 2.5X12 (BALLOONS) IMPLANT
BALLOON ~~LOC~~ SAPPHIRE 4.5X8 (BALLOONS) IMPLANT
CATH DRAGONFLY OPSTAR (CATHETERS) ×1 IMPLANT
CATH VISTA GUIDE 6FR JR4 (CATHETERS) ×1 IMPLANT
CROWN DIAMONDBACK CLASSIC 1.25 (BURR) ×1 IMPLANT
ELECT DEFIB PAD ADLT CADENCE (PAD) ×1 IMPLANT
KIT ENCORE 26 ADVANTAGE (KITS) ×1 IMPLANT
KIT HEART LEFT (KITS) ×2 IMPLANT
LUBRICANT VIPERSLIDE CORONARY (MISCELLANEOUS) ×1 IMPLANT
PACK CARDIAC CATHETERIZATION (CUSTOM PROCEDURE TRAY) ×2 IMPLANT
SHEATH PINNACLE 6F 10CM (SHEATH) ×1 IMPLANT
STENT RESOLUTE ONYX 4.0X12 (Permanent Stent) ×1 IMPLANT
TRANSDUCER W/STOPCOCK (MISCELLANEOUS) ×2 IMPLANT
TUBING CIL FLEX 10 FLL-RA (TUBING) ×2 IMPLANT
WIRE ASAHI PROWATER 180CM (WIRE) ×1 IMPLANT
WIRE EMERALD 3MM-J .035X150CM (WIRE) ×1 IMPLANT
WIRE VIPERWIRE COR FLEX .012 (WIRE) ×1 IMPLANT

## 2020-05-21 NOTE — Interval H&P Note (Signed)
History and Physical Interval Note:  05/21/2020 9:20 AM  Christina [name]  has presented today for surgery, with the diagnosis of cad.  The various methods of treatment have been discussed with the patient and family. After consideration of risks, benefits and other options for treatment, the patient has consented to  Procedure(s): CORONARY STENT INTERVENTION (N/A) as a surgical intervention.  The patient's history has been reviewed, patient examined, no change in status, stable for surgery.  I have reviewed the patient's chart and labs.  Questions were answered to the patient's satisfaction.   Cath Lab Visit (complete for each Cath Lab visit)  Clinical Evaluation Leading to the Procedure:   ACS: No.  Non-ACS:    Anginal Classification: CCS II  Anti-ischemic medical therapy: No Therapy  Non-Invasive Test Results: High-risk stress test findings: cardiac mortality >3%/year  Prior CABG: No previous CABG        [facility] 05/21/2020 9:20 AM

## 2020-05-21 NOTE — Plan of Care (Signed)
  Problem: Education: Goal: Knowledge of General Education information will improve Description: Including pain rating scale, medication(s)/side effects and non-pharmacologic comfort measures Outcome: Progressing   Problem: Health Behavior/Discharge Planning: Goal: Ability to manage health-related needs will improve Outcome: Progressing   Problem: Clinical Measurements: Goal: Ability to maintain clinical measurements within normal limits will improve Outcome: Progressing Goal: Cardiovascular complication will be avoided Outcome: Progressing   Problem: Nutrition: Goal: Adequate nutrition will be maintained Outcome: Progressing   Problem: Education: Goal: Understanding of CV disease, CV risk reduction, and recovery process will improve Outcome: Progressing   Problem: Activity: Goal: Ability to return to baseline activity level will improve Outcome: Progressing   Problem: Cardiovascular: Goal: Ability to achieve and maintain adequate cardiovascular perfusion will improve Outcome: Progressing

## 2020-05-21 NOTE — Progress Notes (Signed)
RN pulled and wasted versed and fentanyl under incorrect patient, Lilianne Delair. The medication records at [facility] under Nicki Reaper were intended for and administered to [employer]. RN  contacted pharmacy and pharmacy billing to update correct charges to [facility].

## 2020-05-21 NOTE — Progress Notes (Addendum)
On arrival Right groin sheath in, dressing dry and intact, noted bruising around site with no hematoma or bleeding. 1320 Checked patients right groin site and noted that gauze was saturated. Removed dressing and noted 2 small hematomas, lateral and proximal. Pressure held and hematomas resolved. Applied gauze dressing, secured with tegaderm.  1355 Sheath pulled, bleeding intermittently until 1205. No bleeding between 1205 and 1225. Bruising noted as before, no bleeding or hematoma. Area distal to sheath, swelling but soft with palpation and no ridge noted. Instructions given to patient regarding bedrest, holding groin with moving, sneezing or coughing and notify nurse if any s/s bleeding noted. Reviewed the s/s with patient and patient verbalized good understanding. Frequent vital signs verified. Patient tolerated well. Level 1, bruising, CSM's wnl and palpable right pedal pulse prior to and after sheath pull. Poonam Kushwaha RN with patient during sheath pull and continues to monitor.  Bedrest started at 1425.

## 2020-05-21 NOTE — Plan of Care (Signed)

## 2020-05-21 NOTE — Progress Notes (Signed)
Pt found to be OOB trying to get ready to use Bathroom. Bled some. Marked the area. VSS stable.  Educated pt on the importance of being on bedrest after heart cath and especially when sheath is on. Pt agreed to stay flat on bed until sheath is out. Nursing will monitor her closely.

## 2020-05-21 NOTE — Progress Notes (Signed)
Progress Note  Patient Name: MONIQUE HEFTY Date of Encounter: 05/21/2020  [facility] Cardiologist: Werner Lean, MD   Subjective   S/p cath today.  Had some bleeding at femoral site that improved with compression.  BP high.  No chest pain.   Inpatient Medications    Scheduled Meds: . [START ON 05/22/2020] aspirin  81 mg Oral Daily  . [START ON 05/22/2020] clopidogrel  75 mg Oral Q breakfast  . escitalopram  10 mg Oral Daily  . ezetimibe  10 mg Oral Daily  . irbesartan  300 mg Oral Daily  . levothyroxine  50 mcg Oral Q0600  . sodium chloride flush  3 mL Intravenous Q12H  . sodium chloride flush  3 mL Intravenous Q12H   Continuous Infusions: . sodium chloride    . sodium chloride    . sodium chloride 1 mL/kg/hr (05/21/20 1230)   PRN Meds: sodium chloride, sodium chloride, acetaminophen, labetalol, nitroGLYCERIN, ondansetron (ZOFRAN) IV, sodium chloride flush, sodium chloride flush   Vital Signs    Vitals:   05/21/20 1425 05/21/20 1430 05/21/20 1458 05/21/20 1513  BP: 116/62 111/73 116/79 130/85  Pulse: (!) 59 60 78 73  Resp:      Temp:      TempSrc:      SpO2: 100% 97% 99% 100%  Weight:      Height:        Intake/Output Summary (Last 24 hours) at 05/21/2020 1550 Last data filed at 05/21/2020 1500 Gross per 24 hour  Intake 146.25 ml  Output 300 ml  Net -153.75 ml   Last 3 Weights 05/20/2020 05/13/2020 04/09/2020  Weight (lbs) 129 lb 130 lb 9.6 oz 129 lb 6.4 oz  Weight (kg) 58.514 kg 59.24 kg 58.695 kg      Telemetry    NSR - Personally Reviewed  ECG    Sinus bradycardia - Personally Reviewed  Physical Exam   GEN: No acute distress.   Neck: No JVD Cardiac: RRR, no murmurs, rubs, or gallops.  Respiratory: Clear to auscultation bilaterally. GI: Soft, nontender, non-distended  MS: No edema; No deformity. Sheath in place in right femoral area Neuro:  Nonfocal  Psych: Normal affect   Labs    High Sensitivity Troponin:  No results for  input(s): TROPONINIHS in the last 720 hours.    Chemistry Recent Labs  Lab 05/21/20 0239  NA 136  K 3.7  CL 103  CO2 24  GLUCOSE 117*  BUN 7*  CREATININE 0.51  CALCIUM 8.6*  GFRNONAA >60  ANIONGAP 9     Hematology Recent Labs  Lab 05/21/20 0239  WBC 9.4  RBC 3.92  HGB 12.6  HCT 35.9*  MCV 91.6  MCH 32.1  MCHC 35.1  RDW 12.1  PLT 157    BNPNo results for input(s): BNP, PROBNP in the last 168 hours.   DDimer No results for input(s): DDIMER in the last 168 hours.   Radiology    CARDIAC CATHETERIZATION  Result Date: 05/21/2020  Mid RCA lesion is 99% stenosed.  Post intervention, there is a 0% residual stenosis.  A drug-eluting stent was successfully placed using a STENT RESOLUTE ONYX 4.0X12.  1. Successful PCI of the mid RCA with OCT guidance and orbital atherectomy and DES x 1. Plan: DAPT for a minimum of 6 months. Anticipate DC tomorrow.   CARDIAC CATHETERIZATION  Addendum Date: 05/20/2020    Prox RCA to Mid RCA lesion is 20% stenosed.  Mid RCA lesion is 99% stenosed.  Ost Cx to Prox Cx lesion is 100% stenosed.  Ost LAD to Prox LAD lesion is 40% stenosed.  Mid LM to Ost LAD lesion is 30% stenosed.  Ramus lesion is 40% stenosed.  1. Mild non-obstructive disease in the distal left main artery, proximal LAD and moderate caliber intermediate branch. 2. CTO of the ostial Circumflex which appears to be a small caliber vessel. 3. The RCA is a large dominant vessel with diffuse calcification throughout. The mid RCA has a focal, severe, calcified stenosis. Recommendations: She will need PCI/stenting of the mid RCA. This will likely require orbital atherectomy given the calcification of the vessel. I will delay the PCI of the RCA given the concern for bleeding in the right arm from complications during sheath placement. The right forearm is being managed with a blood pressure cuff given small hematoma. I do not want to give full dose anti-coagulation today for PCI with the  potential for further bleeding in her right arm. Will not load with anti-platelet agents today while following the right forearm hematoma closely. I will continue ASA and if her right arm is stable, we will plan staged PCI of the RCA with orbital atherectomy and stenting.   Result Date: 05/20/2020  Prox RCA to Mid RCA lesion is 20% stenosed.  Mid RCA lesion is 99% stenosed.  Ost Cx to Prox Cx lesion is 40% stenosed.  Ost LAD to Prox LAD lesion is 40% stenosed.  Mid LM to Ost LAD lesion is 30% stenosed.  1. Mild non-obstructive disease in the distal left main artery, proximal LAD and proximal Circumflex 2. The RCA is a large dominant vessel with diffuse calcification throughout. The mid RCA has a focal, severe, calcified stenosis. Recommendations: She will need PCI/stenting of the mid RCA. This will likely require orbital atherectomy given the calcification of the vessel. I will delay the PCI of the RCA given the concern for bleeding in the right arm from complications during sheath placement. The right forearm is being managed with a blood pressure cuff given small hematoma. I do not want to give full dose anti-coagulation today for PCI with the potential for further bleeding in her right arm. Will not load with anti-platelet agents today while following the right forearm hematoma closely. I will continue ASA and if her right arm is stable, we will plan staged PCI of the RCA with orbital atherectomy and stenting.    Cardiac Studies   Cath reports reviewed  Patient Profile     86 y.o. female s/p PCI  Assessment & Plan    1) CAD: Needs DAPT.  Plan for discharge tomorrow.  Bedrest post sheath pull.  Labetolol added for BP contriol during sheath pull./    COntinue aggressive secondary prevention.   For questions or updates, please contact [place] Please consult [email or web address] for contact info under        Signed, Larae Grooms, MD  05/21/2020, 3:50 PM

## 2020-05-22 ENCOUNTER — Other Ambulatory Visit: Payer: Self-pay | Admitting: Cardiovascular Disease

## 2020-05-22 DIAGNOSIS — E782 Mixed hyperlipidemia: Secondary | ICD-10-CM

## 2020-05-22 DIAGNOSIS — I200 Unstable angina: Secondary | ICD-10-CM | POA: Diagnosis not present

## 2020-05-22 LAB — BASIC METABOLIC PANEL
Anion gap: 9 (ref 5–15)
BUN: 9 mg/dL (ref 8–23)
CO2: 24 mmol/L (ref 22–32)
Calcium: 8.8 mg/dL — ABNORMAL LOW (ref 8.9–10.3)
Chloride: 101 mmol/L (ref 98–111)
Creatinine, Ser: 0.63 mg/dL (ref 0.44–1.00)
GFR, Estimated: >60 mL/min (ref >60)
Glucose, Bld: 119 mg/dL — ABNORMAL HIGH (ref 70–99)
Potassium: 3.8 mmol/L (ref 3.5–5.1)
Sodium: 134 mmol/L — ABNORMAL LOW (ref 135–145)

## 2020-05-22 LAB — CBC
HCT: 35.4 % — ABNORMAL LOW (ref 36.0–46.0)
Hemoglobin: 12 g/dL (ref 12.0–15.0)
MCH: 31.3 pg (ref 26.0–34.0)
MCHC: 33.9 g/dL (ref 30.0–36.0)
MCV: 92.4 fL (ref 80.0–100.0)
Platelets: 160 10*3/uL (ref 150–400)
RBC: 3.83 MIL/uL — ABNORMAL LOW (ref 3.87–5.11)
RDW: 11.9 % (ref 11.5–15.5)
WBC: 9.2 10*3/uL (ref 4.0–10.5)
nRBC: 0 % (ref 0.0–0.2)

## 2020-05-22 MED ORDER — ASPIRIN EC 81 MG PO TBEC: 81.0000 mg | DELAYED_RELEASE_TABLET | Freq: Every day | ORAL | 3 refills | Status: DC

## 2020-05-22 MED ORDER — CLOPIDOGREL BISULFATE 75 MG PO TABS: 75.0000 mg | ORAL_TABLET | Freq: Every day | ORAL | 3 refills | Status: AC

## 2020-05-22 MED ORDER — IRBESARTAN 150 MG PO TABS: 300.0000 mg | ORAL_TABLET | Freq: Every day | ORAL | Status: DC

## 2020-05-22 MED FILL — ASPIRIN LOW DOSE 81 MG TBEC: 81 | 90 days supply | Qty: 90 | Fill #0

## 2020-05-22 MED FILL — CLOPIDOGREL 75 MG TABLET: 75 | 90 days supply | Qty: 90 | Fill #0

## 2020-05-22 NOTE — Progress Notes (Signed)
CARDIAC REHAB PHASE I   PRE:  Rate/Rhythm: 85 SR  BP:  Supine:   Sitting: 120/72  Standing:    SaO2: 97%RA  MODE:  Ambulation: 470 ft   POST:  Rate/Rhythm: 97 SR  BP:  Supine:   Sitting: 140/72  Standing:    SaO2: 98%RA 0820-0915 Pt walked 470 ft on RA with hand held asst. Tolerated well and denied CP. Discussed with pt the importance of plavix with stent. Reviewed NTG use, walking for exercise (as tolerated ), heart healthy food choices, and CRP 2. Referred to [facility]. Pt is interested in follow up. Did not find stent card in chart or room. Pt to ask RN about.    Graylon Good, RN BSN  05/22/2020 9:10 AM

## 2020-05-22 NOTE — Discharge Summary (Addendum)
Discharge Summary    Patient ID: Christina [name] MRN: [id number]; DOB: [birth year]  Admit date: 05/20/2020 Discharge date: 05/22/2020  Primary Care Provider: [name], MD  Primary Cardiologist: Werner Lean, MD  Primary Electrophysiologist:  None   Discharge Diagnoses    Principal Problem:   Unstable angina [facility]) Active Problems:   Mixed hyperlipidemia   Hypertension    Diagnostic Studies/Procedures    Cath: 05/20/20   Prox RCA to Mid RCA lesion is 20% stenosed.  Mid RCA lesion is 99% stenosed.  Ost Cx to Prox Cx lesion is 100% stenosed.  Ost LAD to Prox LAD lesion is 40% stenosed.  Mid LM to Ost LAD lesion is 30% stenosed.  Ramus lesion is 40% stenosed.   1. Mild non-obstructive disease in the distal left main artery, proximal LAD and moderate caliber intermediate branch.  2. CTO of the ostial Circumflex which appears to be a small caliber vessel.  3. The RCA is a large dominant vessel with diffuse calcification throughout. The mid RCA has a focal, severe, calcified stenosis.   Recommendations: She will need PCI/stenting of the mid RCA. This will likely require orbital atherectomy given the calcification of the vessel. I will delay the PCI of the RCA given the concern for bleeding in the right arm from complications during sheath placement. The right forearm is being managed with a blood pressure cuff given small hematoma. I do not want to give full dose anti-coagulation today for PCI with the potential for further bleeding in her right arm. Will not load with anti-platelet agents today while following the right forearm hematoma closely. I will continue ASA and if her right arm is stable, we will plan staged PCI of the RCA with orbital atherectomy and stenting.   Diagnostic Dominance: Right   Cath: 05/21/20   Mid RCA lesion is 99% stenosed.  Post intervention, there is a 0% residual stenosis.  A drug-eluting stent was successfully placed using a STENT  RESOLUTE ONYX 4.0X12.   1. Successful PCI of the mid RCA with OCT guidance and orbital atherectomy and DES x 1.  Plan: DAPT for a minimum of 6 months. Anticipate DC tomorrow.   Diagnostic Dominance: Right    Intervention     _____________   History of Present Illness     Christina [name] is a 86 y.o. female with PMH of medically managed CAD, DM, HTN, HLD who presented for outpatient cardiac cath after CCTA concerning for obstructive disease.   Hospital Course     1. CAD: Underwent initial cardiac cath on 2/7 with noted CTO of the ostial circumflex, mild nonobstructive disease in the left main and proximal LAD. RCA with 99% calcified lesion mid vessel which needed orbital atherectomy. Developed right forearm hematoma during procedure. Decision was made to delay cath till the following day and not load with antiplatelets. Underwent successful PCI/DES x1 to the mid RCA with OTC guidance and orbital atherectomy on 2/8 with Dr. Martinique. Plan for DAPT with aspirin/Plavix for minimum of 6 months. No complications noted overnight. Morning labs were stable.   2. HTN: blood pressures stable but soft. No room for titration. Bradycardia preventing the use of BB therapy  3. HLD: has been intolerant to statins in the past. On Zetia. Will place lipid clinic referral at discharge  4. Pre-DM: Hgb A1c 6.3 back in 2018 -- recheck as an outpatient  General: Well developed, well nourished, female appearing in no acute distress. Head: Normocephalic, atraumatic.  Neck: Supple  without bruits, JVD. Lungs:  Resp regular and unlabored, CTA. Heart: RRR, S1, S2, no S3, S4, or murmur; no rub. Abdomen: Soft, non-tender, non-distended with normoactive bowel sounds. No hepatomegaly. No rebound/guarding. No obvious abdominal masses. Extremities: No clubbing, cyanosis, edema. Distal pedal pulses are 2+ bilaterally. Right radial cath site stable with bruising throughout forearm. No hematoma. Right femoral site  without hematoma but ecchymosis extending into the medial groin. Neuro: Alert and oriented X 3. Moves all extremities spontaneously. Psych: Normal affect.   Did the patient have an acute coronary syndrome (MI, NSTEMI, STEMI, etc) this admission?:  No                               Did the patient have a percutaneous coronary intervention (stent / angioplasty)?:  Yes.     Cath/PCI Registry Performance & Quality Measures: 1. Aspirin prescribed? - Yes 2. ADP Receptor Inhibitor (Plavix/Clopidogrel, Brilinta/Ticagrelor or Effient/Prasugrel) prescribed (includes medically managed patients)? - Yes 3. High Intensity Statin (Lipitor 40-80mg  or Crestor 20-40mg ) prescribed? - No - intolerant 4. For EF <40%, was ACEI/ARB prescribed? - Yes 5. For EF <40%, Aldosterone Antagonist (Spironolactone or Eplerenone) prescribed? - Not Applicable (EF >/= 40%) 6. Cardiac Rehab Phase II ordered? - Yes       _____________  Discharge Vitals Blood pressure 120/72, pulse 74, temperature 98 F (36.7 C), temperature source Oral, resp. rate 16, height 5' 3.5" (1.613 m), weight 58.4 kg, SpO2 95 %.  Filed Weights   05/20/20 0545 05/22/20 0500  Weight: 58.5 kg 58.4 kg    Labs & Radiologic Studies    CBC Recent Labs    05/21/20 0239 05/22/20 0125  WBC 9.4 9.2  HGB 12.6 12.0  HCT 35.9* 35.4*  MCV 91.6 92.4  PLT 157 981   Basic Metabolic Panel Recent Labs    05/21/20 0239 05/22/20 0125  NA 136 134*  K 3.7 3.8  CL 103 101  CO2 24 24  GLUCOSE 117* 119*  BUN 7* 9  CREATININE 0.51 0.63  CALCIUM 8.6* 8.8*   Liver Function Tests No results for input(s): AST, ALT, ALKPHOS, BILITOT, PROT, ALBUMIN in the last 72 hours. No results for input(s): LIPASE, AMYLASE in the last 72 hours. High Sensitivity Troponin:   No results for input(s): TROPONINIHS in the last 720 hours.  BNP Invalid input(s): POCBNP D-Dimer No results for input(s): DDIMER in the last 72 hours. Hemoglobin A1C No results for input(s):  HGBA1C in the last 72 hours. Fasting Lipid Panel No results for input(s): CHOL, HDL, LDLCALC, TRIG, CHOLHDL, LDLDIRECT in the last 72 hours. Thyroid Function Tests No results for input(s): TSH, T4TOTAL, T3FREE, THYROIDAB in the last 72 hours.  Invalid input(s): FREET3 _____________  CARDIAC CATHETERIZATION  Result Date: 05/21/2020  Mid RCA lesion is 99% stenosed.  Post intervention, there is a 0% residual stenosis.  A drug-eluting stent was successfully placed using a STENT RESOLUTE ONYX 4.0X12.  1. Successful PCI of the mid RCA with OCT guidance and orbital atherectomy and DES x 1. Plan: DAPT for a minimum of 6 months. Anticipate DC tomorrow.   CARDIAC CATHETERIZATION  Addendum Date: 05/20/2020    Prox RCA to Mid RCA lesion is 20% stenosed.  Mid RCA lesion is 99% stenosed.  Ost Cx to Prox Cx lesion is 100% stenosed.  Ost LAD to Prox LAD lesion is 40% stenosed.  Mid LM to Ost LAD lesion is 30% stenosed.  Ramus lesion  is 40% stenosed.  1. Mild non-obstructive disease in the distal left main artery, proximal LAD and moderate caliber intermediate branch. 2. CTO of the ostial Circumflex which appears to be a small caliber vessel. 3. The RCA is a large dominant vessel with diffuse calcification throughout. The mid RCA has a focal, severe, calcified stenosis. Recommendations: She will need PCI/stenting of the mid RCA. This will likely require orbital atherectomy given the calcification of the vessel. I will delay the PCI of the RCA given the concern for bleeding in the right arm from complications during sheath placement. The right forearm is being managed with a blood pressure cuff given small hematoma. I do not want to give full dose anti-coagulation today for PCI with the potential for further bleeding in her right arm. Will not load with anti-platelet agents today while following the right forearm hematoma closely. I will continue ASA and if her right arm is stable, we will plan staged PCI of the  RCA with orbital atherectomy and stenting.   Result Date: 05/20/2020  Prox RCA to Mid RCA lesion is 20% stenosed.  Mid RCA lesion is 99% stenosed.  Ost Cx to Prox Cx lesion is 40% stenosed.  Ost LAD to Prox LAD lesion is 40% stenosed.  Mid LM to Ost LAD lesion is 30% stenosed.  1. Mild non-obstructive disease in the distal left main artery, proximal LAD and proximal Circumflex 2. The RCA is a large dominant vessel with diffuse calcification throughout. The mid RCA has a focal, severe, calcified stenosis. Recommendations: She will need PCI/stenting of the mid RCA. This will likely require orbital atherectomy given the calcification of the vessel. I will delay the PCI of the RCA given the concern for bleeding in the right arm from complications during sheath placement. The right forearm is being managed with a blood pressure cuff given small hematoma. I do not want to give full dose anti-coagulation today for PCI with the potential for further bleeding in her right arm. Will not load with anti-platelet agents today while following the right forearm hematoma closely. I will continue ASA and if her right arm is stable, we will plan staged PCI of the RCA with orbital atherectomy and stenting.   CT CORONARY MORPH W/CTA COR W/SCORE W/CA W/CM &/OR WO/CM  Addendum Date: 04/25/2020   ADDENDUM REPORT: 04/25/2020 21:02 CLINICAL DATA:  Chest pain EXAM: Cardiac CTA MEDICATIONS: Sub lingual nitro. 4mg  x 2 TECHNIQUE: The patient was scanned on a Siemens 458 slice scanner. Gantry rotation speed was 250 msecs. Collimation was 0.6 mm. A 100 kV prospective scan was triggered in the ascending thoracic aorta at 35-75% of the R-R interval. Average HR during the scan was 60 bpm. The 3D data set was interpreted on a dedicated work station using MPR, MIP and VRT modes. A total of 80cc of contrast was used. FINDINGS: Non-cardiac: See separate report from [facility]. The pulmonary veins drain normally to the left atrium. No  LA appendage thrombus. Calcium Score: 1418 Agatston units. Coronary Arteries: Right dominant with no anomalies LM: Mixed plaque distally with minimal stenosis. LAD system: Calcified plaque proximal LAD, possible moderate (51-69%) stenosis. Large D1 with calcified plaque at the ostium/proximally. Possible moderate (51-69%) stenosis. Circumflex system: Small LCx with ostial calcified plaque, possible moderate (51-69%) stenosis. RCA system: The RCA has an acute take-off angle. There is calcified plaque at the ostium, possible moderate (51-69%) stenosis. Calcified plaque proximal RCA, mild (<50%) stenosis. Calcified plaque in the mid to distal RCA, suspect  mild (<50%) stenosis. IMPRESSION: 1. Coronary artery calcium score 1418 Agatston units. This places the patient in the 93rd percentile for age and gender. 2. Acute angle RCA take-off, concern for significant ostial stenosis. 3. Extensive calcified plaque in the proximal LAD and D1, cannot rule out moderate stenoses in both vessels. 4. Small LCx with ostial calcified plaque, again cannot rule out moderate stenosis. Will send for FFR, quantification is difficult with significant blooming artifact. Dalton Mclean Electronically Signed   By: Loralie Champagne M.D.   On: 04/25/2020 21:02   Result Date: 04/25/2020 EXAM: OVER-READ INTERPRETATION  CT CHEST The following report is an over-read performed by radiologist Dr. Vinnie Langton of [facility], [place] on 04/25/2020. This over-read does not include interpretation of cardiac or coronary anatomy or pathology. The coronary calcium score/coronary CTA interpretation by the cardiologist is attached. COMPARISON:  None. FINDINGS: Aortic atherosclerosis. Large hiatal hernia. Within the visualized portions of the thorax there are no suspicious appearing pulmonary nodules or masses, there is no acute consolidative airspace disease, no pleural effusions, no pneumothorax and no lymphadenopathy. Visualized portions of the upper  abdomen are unremarkable. There are no aggressive appearing lytic or blastic lesions noted in the visualized portions of the skeleton. Chronic appearing compression fracture of a mid to lower thoracic vertebral body with 50% loss of anterior vertebral body height. IMPRESSION: 1. Large hiatal hernia. 2.  Aortic Atherosclerosis (ICD10-I70.0). Electronically Signed: By: Vinnie Langton M.D. On: 04/25/2020 12:46   CT CORONARY FRACTIONAL FLOW RESERVE DATA PREP  Result Date: 04/29/2020 CLINICAL DATA:  Chest pain EXAM: CT FFR MEDICATIONS: No additional medications TECHNIQUE: The coronary CT was sent for FFR. FINDINGS: FFR 0.9 mid RCA FFR 0.77 mid LCx FFR 0.73 distal LAD FFR 0.75 distal D1 IMPRESSION: 1. There does not appear to be hemodynamically significant disease in the RCA. 2. Suspect hemodynamically significant stenosis at the ostium of the small LCx. 3. FFR in distal LAD and distal D1 suggestive of hemodynamically significant disease. However, there is no discrete single stenosis, suspect this is the cumulative result of extensive mild-moderate disease in these vessels. Dalton Mclean Electronically Signed   By: Loralie Champagne M.D.   On: 04/29/2020 12:21   ECHOCARDIOGRAM COMPLETE  Result Date: 05/06/2020    ECHOCARDIOGRAM REPORT   Patient Name:   Christina [name] Date of Exam: 05/06/2020 Medical Rec #:  [id number]      Height:       63.5 in Accession #:    [id number]     Weight:       129.4 lb Date of Birth:  1934/08/24       BSA:          1.616 m Patient Age:    46 years       BP:           112/68 mmHg Patient Gender: F              HR:           61 bpm. Exam Location:  [place] Procedure: 2D Echo, Cardiac Doppler and Color Doppler Indications:    R01.1 Murmur                 I25.10 CAD  History:        Patient has prior history of Echocardiogram examinations, most                 recent 07/01/2016. CAD, Signs/Symptoms:Chest Pain, Shortness of  Breath and Murmur; Risk Factors:Hypertension,  Diabetes,                 Dyslipidemia and Family History of Coronary Artery Disease.  Sonographer:    Griffin Basil RVT, RDMS Referring Phys: [id number] [facility] A CHANDRASEKHAR IMPRESSIONS  1. Left ventricular ejection fraction, by estimation, is 60 to 65%. The left ventricle has normal function. The left ventricle has no regional wall motion abnormalities. Left ventricular diastolic parameters are consistent with Grade I diastolic dysfunction (impaired relaxation). Elevated left atrial pressure.  2. Right ventricular systolic function is normal. The right ventricular size is normal. There is normal pulmonary artery systolic pressure. The estimated right ventricular systolic pressure is 25.9 mmHg.  3. The mitral valve is normal in structure. Trivial mitral valve regurgitation. No evidence of mitral stenosis. Moderate mitral annular calcification.  4. The aortic valve is normal in structure. Aortic valve regurgitation is trivial. No aortic stenosis is present.  5. The inferior vena cava is normal in size with greater than 50% respiratory variability, suggesting right atrial pressure of 3 mmHg. Comparison(s): No prior Echocardiogram. FINDINGS  Left Ventricle: Left ventricular ejection fraction, by estimation, is 60 to 65%. The left ventricle has normal function. The left ventricle has no regional wall motion abnormalities. The left ventricular internal cavity size was normal in size. There is  no left ventricular hypertrophy. Left ventricular diastolic parameters are consistent with Grade I diastolic dysfunction (impaired relaxation). Elevated left atrial pressure. Right Ventricle: The right ventricular size is normal. No increase in right ventricular wall thickness. Right ventricular systolic function is normal. There is normal pulmonary artery systolic pressure. The tricuspid regurgitant velocity is 2.46 m/s, and  with an assumed right atrial pressure of 3 mmHg, the estimated right ventricular systolic pressure is  56.3 mmHg. Left Atrium: Left atrial size was normal in size. Right Atrium: Right atrial size was normal in size. Pericardium: There is no evidence of pericardial effusion. Mitral Valve: The mitral valve is normal in structure. Moderate mitral annular calcification. Trivial mitral valve regurgitation. No evidence of mitral valve stenosis. Tricuspid Valve: The tricuspid valve is normal in structure. Tricuspid valve regurgitation is mild . No evidence of tricuspid stenosis. Aortic Valve: The aortic valve is normal in structure. Aortic valve regurgitation is trivial. No aortic stenosis is present. Pulmonic Valve: The pulmonic valve was normal in structure. Pulmonic valve regurgitation is not visualized. No evidence of pulmonic stenosis. Aorta: The aortic root is normal in size and structure. Venous: The inferior vena cava is normal in size with greater than 50% respiratory variability, suggesting right atrial pressure of 3 mmHg. IAS/Shunts: No atrial level shunt detected by color flow Doppler.  LEFT VENTRICLE PLAX 2D LVIDd:         4.60 cm  Diastology LVIDs:         3.20 cm  LV e' medial:    5.98 cm/s LV PW:         0.90 cm  LV E/e' medial:  17.6 LV IVS:        0.60 cm  LV e' lateral:   8.81 cm/s LVOT diam:     2.10 cm  LV E/e' lateral: 11.9 LV SV:         75 LV SV Index:   46 LVOT Area:     3.46 cm  RIGHT VENTRICLE RV S prime:     12.20 cm/s TAPSE (M-mode): 2.1 cm LEFT ATRIUM  Index       RIGHT ATRIUM           Index LA diam:        2.90 cm 1.79 cm/m  RA Area:     14.50 cm LA Vol (A2C):   32.5 ml 20.11 ml/m RA Volume:   36.70 ml  22.70 ml/m LA Vol (A4C):   50.5 ml 31.24 ml/m LA Biplane Vol: 41.4 ml 25.61 ml/m  AORTIC VALVE LVOT Vmax:   97.90 cm/s LVOT Vmean:  67.700 cm/s LVOT VTI:    0.216 m  AORTA Ao Root diam: 3.00 cm Ao Asc diam:  3.50 cm MITRAL VALVE                TRICUSPID VALVE MV Area (PHT): cm          TR Peak grad:   24.2 mmHg MV Decel Time: 221 msec     TR Vmax:        246.00 cm/s MV E  velocity: 105.00 cm/s MV A velocity: 100.00 cm/s  SHUNTS MV E/A ratio:  1.05         Systemic VTI:  0.22 m                             Systemic Diam: 2.10 cm Ena Dawley MD Electronically signed by Ena Dawley MD Signature Date/Time: 05/06/2020/1:10:36 PM    Final    Disposition   Pt is being discharged home today in good condition.  Follow-up Plans & Appointments     Follow-up Information    Werner Lean, MD Follow up on 06/04/2020.   Specialty: Cardiology Why: at 11am for your follow up appt Contact information: [address] [place] [address] [phone]              Discharge Instructions    AMB Referral to [facility] Pharm-D   Complete by: As directed    Reason For Referral: Lipids   Amb Referral to Cardiac Rehabilitation   Complete by: As directed    Diagnosis: Coronary Stents   After initial evaluation and assessments completed: Virtual Based Care may be provided alone or in conjunction with Phase 2 Cardiac Rehab based on patient barriers.: Yes   Diet - low sodium heart healthy   Complete by: As directed    Discharge instructions   Complete by: As directed    Radial Site Care Refer to this sheet in the next few weeks. These instructions provide you with information on caring for yourself after your procedure. Your caregiver may also give you more specific instructions. Your treatment has been planned according to current medical practices, but problems sometimes occur. Call your caregiver if you have any problems or questions after your procedure. HOME CARE INSTRUCTIONS You may shower the day after the procedure.Remove the bandage (dressing) and gently wash the site with plain soap and water.Gently pat the site dry.  Do not apply powder or lotion to the site.  Do not submerge the affected site in water for 3 to 5 days.  Inspect the site at least twice daily.  Do not flex or bend the affected arm for 24 hours.  No lifting over 5  pounds (2.3 kg) for 5 days after your procedure.  Do not drive home if you are discharged the same day of the procedure. Have someone else drive you.  You may drive 24 hours after the procedure unless otherwise  instructed by your caregiver.  What to expect: Any bruising will usually fade within 1 to 2 weeks.  Blood that collects in the tissue (hematoma) may be painful to the touch. It should usually decrease in size and tenderness within 1 to 2 weeks.  SEEK IMMEDIATE MEDICAL CARE IF: You have unusual pain at the radial site.  You have redness, warmth, swelling, or pain at the radial site.  You have drainage (other than a small amount of blood on the dressing).  You have chills.  You have a fever or persistent symptoms for more than 72 hours.  You have a fever and your symptoms suddenly get worse.  Your arm becomes pale, cool, tingly, or numb.  You have heavy bleeding from the site. Hold pressure on the site.   Groin Site Care Refer to this sheet in the next few weeks. These instructions provide you with information on caring for yourself after your procedure. Your caregiver may also give you more specific instructions. Your treatment has been planned according to current medical practices, but problems sometimes occur. Call your caregiver if you have any problems or questions after your procedure. HOME CARE INSTRUCTIONS You may shower 24 hours after the procedure. Remove the bandage (dressing) and gently wash the site with plain soap and water. Gently pat the site dry.  Do not apply powder or lotion to the site.  Do not sit in a bathtub, swimming pool, or whirlpool for 5 to 7 days.  No bending, squatting, or lifting anything over 10 pounds (4.5 kg) as directed by your caregiver.  Inspect the site at least twice daily.  Do not drive home if you are discharged the same day of the procedure. Have someone else drive you.  You may drive 24 hours after the procedure unless otherwise instructed by  your caregiver.  What to expect: Any bruising will usually fade within 1 to 2 weeks.  Blood that collects in the tissue (hematoma) may be painful to the touch. It should usually decrease in size and tenderness within 1 to 2 weeks.  SEEK IMMEDIATE MEDICAL CARE IF: You have unusual pain at the groin site or down the affected leg.  You have redness, warmth, swelling, or pain at the groin site.  You have drainage (other than a small amount of blood on the dressing).  You have chills.  You have a fever or persistent symptoms for more than 72 hours.  You have a fever and your symptoms suddenly get worse.  Your leg becomes pale, cool, tingly, or numb.  You have heavy bleeding from the site. Hold pressure on the site. Marland Kitchen  PLEASE DO NOT MISS ANY DOSES OF YOUR PLAVIX!!!!! Also keep a log of you blood pressures and bring back to your follow up appt. Please call the office with any questions.   Patients taking blood thinners should generally stay away from medicines like ibuprofen, Advil, Motrin, naproxen, and Aleve due to risk of stomach bleeding. You may take Tylenol as directed or talk to your primary doctor about alternatives.   PLEASE ENSURE THAT YOU DO NOT RUN OUT OF YOUR PLAVIX. This medication is very important to remain on for at least one year. IF you have issues obtaining this medication due to cost please CALL the office 3-5 business days prior to running out in order to prevent missing doses of this medication.   Increase activity slowly   Complete by: As directed       Discharge Medications  Allergies as of 05/22/2020      Reactions   Atorvastatin Other (See Comments)   Muscle pain   Codeine    itching      Medication List    STOP taking these medications   aspirin 325 MG tablet Replaced by: aspirin EC 81 MG tablet     TAKE these medications   aspirin EC 81 MG tablet Take 1 tablet (81 mg total) by mouth daily. Swallow whole. Replaces: aspirin 325 MG tablet    clopidogrel 75 MG tablet Commonly known as: PLAVIX Take 1 tablet (75 mg total) by mouth daily with breakfast.   escitalopram 10 MG tablet Commonly known as: LEXAPRO Take 10 mg by mouth daily.   ezetimibe 10 MG tablet Commonly known as: ZETIA Take 10 mg by mouth daily.   fish oil-omega-3 fatty acids 1000 MG capsule Take 1 g by mouth daily as needed (if wat something with fat).   irbesartan 300 MG tablet Commonly known as: AVAPRO Take 300 mg by mouth at bedtime.   levothyroxine 50 MCG tablet Commonly known as: SYNTHROID TAKE 1 TABLET BY MOUTH EVERY DAY What changed: when to take this   LORazepam 2 MG tablet Commonly known as: ATIVAN Take 2 mg by mouth at bedtime.   multivitamin with minerals tablet Take 1 tablet by mouth daily. Centrum Silver   niacin 500 MG tablet Take 500 mg by mouth daily.   nitroGLYCERIN 0.4 MG SL tablet Commonly known as: Nitrostat Place 1 tablet (0.4 mg total) under the tongue every 5 (five) minutes as needed for chest pain.   VITAMIN B 12 PO Take 5,000 Units by mouth daily.   Vitamin D-3 125 MCG (5000 UT) Tabs Take 5,000 Units by mouth daily.   VITAMIN K2 PO Take 1 tablet by mouth daily. MK2          Outstanding Labs/Studies   Outpatient referral to lipid clinic placed  Duration of Discharge Encounter   Greater than 30 minutes including physician time.  Signed, Reino Bellis, NP 05/22/2020, 9:44 AM   I have examined the patient and reviewed assessment and plan and discussed with patient.  Agree with above as stated.    Right forearm mildly swollen but soft.  Significant bruising noted at the right forearm and the right groin. No bruit at the right groin. Palpable PT and DP pulses on the right.    No chest pain.  Continue DAPT with Plavix.  Continue aggressive secondary prevention.   Larae Grooms

## 2020-05-24 ENCOUNTER — Telehealth (HOSPITAL_COMMUNITY): Payer: Self-pay

## 2020-05-24 NOTE — Telephone Encounter (Signed)
Pt insurance is active and benefits verified through [facility] Medicare Co-pay 0, DED 0/0 met, out of pocket $4,500/$278.84 met, co-insurance 0%. no pre-authorization required. Passport, 05/24/2020@10 :13am, REF# [id number]  Will contact patient to see if she is interested in the Cardiac Rehab Program. If interested, patient will need to complete follow up appt. Once completed, patient will be contacted for scheduling upon review by the RN Navigator.

## 2020-05-28 ENCOUNTER — Ambulatory Visit (INDEPENDENT_AMBULATORY_CARE_PROVIDER_SITE_OTHER): Payer: Medicare Other | Admitting: Internal Medicine

## 2020-05-28 ENCOUNTER — Encounter: Payer: Self-pay | Admitting: Internal Medicine

## 2020-05-28 VITALS — BP 128/68 | HR 64 | Ht 63.5 in | Wt 130.0 lb

## 2020-05-28 DIAGNOSIS — Z7902 Long term (current) use of antithrombotics/antiplatelets: Secondary | ICD-10-CM

## 2020-05-28 DIAGNOSIS — Z86010 Personal history of colonic polyps: Secondary | ICD-10-CM | POA: Diagnosis not present

## 2020-05-28 DIAGNOSIS — R195 Other fecal abnormalities: Secondary | ICD-10-CM

## 2020-05-28 NOTE — Patient Instructions (Signed)
Dr Hilarie Fredrickson will speak with Dr Joylene Draft and Dr Gasper Sells regarding whether they feel it appropriate for you to have a procedure in the near future. We will be in touch after those conversations.  If you are age 84 or older, your body mass index should be between 23-30. Your Body mass index is 22.67 kg/m. If this is out of the aforementioned range listed, please consider follow up with your Primary Care Provider.  If you are age 67 or younger, your body mass index should be between 19-25. Your Body mass index is 22.67 kg/m. If this is out of the aformentioned range listed, please consider follow up with your Primary Care Provider.   Due to recent changes in healthcare laws, you may see the results of your imaging and laboratory studies on MyChart before your provider has had a chance to review them.  We understand that in some cases there may be results that are confusing or concerning to you. Not all laboratory results come back in the same time frame and the provider may be waiting for multiple results in order to interpret others.  Please give [place] 48 hours in order for your provider to thoroughly review all the results before contacting the office for clarification of your results.

## 2020-05-28 NOTE — Progress Notes (Signed)
Patient ID: Christina [name], female   DOB: Nov 27, 1934, 86 y.o.   MRN: [id number] HPI: Christina [name] is an 86 year old female with a history of adenomatous colon polyps, colonic diverticulosis, CAD with very recent PCI (drug-eluting stent February 2022) started on Plavix, hypertension, hyperlipidemia, diabetes who is seen in consult at the request of Dr. Joylene Draft to discuss a positive Cologuard test.  She is here alone today.  I did see her in October 2017 on one occasion to evaluate epigastric and mid abdominal pain.  The symptoms resolved entirely.  She reports that she recently completed a Cologuard test after seeing Dr. Joylene Draft.  This test was positive and this led to this appointment.  She reports her bowel habits have been regular.  She denies abdominal pain, change in bowel habit, diarrhea or constipation.  No visible blood in stool or melena.  No weight loss.  She denies heartburn, dysphagia and odynophagia.  There is a family history of colon cancer in her nephew.  Her last colonoscopy was performed by Dr. Olevia Perches on 03/30/2014.  This revealed 2 polyps 1 in the ascending, the other in the sigmoid which were removed by cold snare.  These were found to be adenomatous.  No recall was placed due to age.   Past Medical History:  Diagnosis Date  . Anemia, unspecified   . Cataract   . Chest pain   . Coronary atherosclerosis of native coronary artery   . Depressive disorder, not elsewhere classified   . Diabetes mellitus   . Disorder of bone and cartilage, unspecified   . Diverticulosis of colon (without mention of hemorrhage)   . External hemorrhoids without mention of complication   . Family history of malignant neoplasm of gastrointestinal tract   . H/O: hysterectomy   . History of back surgery 2013    spinal stenosis 7 31 [place]  . Hx of dislocation of shoulder    right  . Hyperlipidemia    134 12-2013 per pt. on meds  . Hypertension   . Insomnia, unspecified   . Lumbago   .  Personal history of colonic polyps   . Squamous cell carcinoma of skin 12/20/2017   well diff-right upper forearm (txpbx)  . Unspecified adverse effect of other drug, medicinal and biological substance(995.29)   . Unspecified hypothyroidism   . Unspecified vitamin D deficiency   . Urinary frequency   . Urinary tract infection, site not specified     Past Surgical History:  Procedure Laterality Date  . ABDOMINAL HYSTERECTOMY    . APPENDECTOMY     1961  . BACK SURGERY     2013   . CATARACT EXTRACTION Right 02-17-2014  . CHOLECYSTECTOMY     2006  . COLONOSCOPY    . CORONARY ATHERECTOMY N/A 05/21/2020   Procedure: CORONARY ATHERECTOMY;  Surgeon: Martinique, Peter M, MD;  Location: [facility];  Service: Cardiovascular;  Laterality: N/A;  . CORONARY STENT INTERVENTION N/A 05/21/2020   Procedure: CORONARY STENT INTERVENTION;  Surgeon: Martinique, Peter M, MD;  Location: [facility];  Service: Cardiovascular;  Laterality: N/A;  . HEMORRHOID SURGERY    . INTRAVASCULAR ULTRASOUND/IVUS N/A 05/21/2020   Procedure: Intravascular Ultrasound/IVUS;  Surgeon: Martinique, Peter M, MD;  Location: [facility];  Service: Cardiovascular;  Laterality: N/A;  . LEFT HEART CATH AND CORONARY ANGIOGRAPHY N/A 05/20/2020   Procedure: LEFT HEART CATH AND CORONARY ANGIOGRAPHY;  Surgeon: Burnell Blanks, MD;  Location: [facility];  Service:  Cardiovascular;  Laterality: N/A;  . POLYPECTOMY    . [place], 2001    Outpatient Medications Prior to Visit  Medication Sig Dispense Refill  . aspirin EC 81 MG tablet Take 1 tablet (81 mg total) by mouth daily. Swallow whole. 90 tablet 3  . Cholecalciferol (VITAMIN D-3) 125 MCG (5000 UT) TABS Take 5,000 Units by mouth daily.    . clopidogrel (PLAVIX) 75 MG tablet Take 1 tablet (75 mg total) by mouth daily with breakfast. 90 tablet 3  . Cyanocobalamin (VITAMIN B 12 PO) Take 5,000 Units by mouth daily.    Marland Kitchen escitalopram (LEXAPRO) 10 MG  tablet Take 10 mg by mouth daily.    Marland Kitchen ezetimibe (ZETIA) 10 MG tablet Take 10 mg by mouth daily.    . fish oil-omega-3 fatty acids 1000 MG capsule Take 1 g by mouth daily as needed (if wat something with fat).    . irbesartan (AVAPRO) 300 MG tablet Take 300 mg by mouth at bedtime.    Marland Kitchen levothyroxine (SYNTHROID, LEVOTHROID) 50 MCG tablet TAKE 1 TABLET BY MOUTH EVERY DAY (Patient taking differently: Take 50 mcg by mouth daily before breakfast.) 30 tablet 5  . LORazepam (ATIVAN) 2 MG tablet Take 2 mg by mouth at bedtime.    . Menaquinone-7 (VITAMIN K2 PO) Take 1 tablet by mouth daily. MK2 (Patient not taking: Reported on 05/28/2020)    . Multiple Vitamins-Minerals (MULTIVITAMIN WITH MINERALS) tablet Take 1 tablet by mouth daily. Centrum Silver    . niacin 500 MG tablet Take 500 mg by mouth daily.    . nitroGLYCERIN (NITROSTAT) 0.4 MG SL tablet Place 1 tablet (0.4 mg total) under the tongue every 5 (five) minutes as needed for chest pain. 25 tablet 11   No facility-administered medications prior to visit.    Allergies  Allergen Reactions  . Atorvastatin Other (See Comments)    Muscle pain  . Codeine     itching    Family History  Problem Relation Age of Onset  . Heart disease Mother        died age 64 had dm  . Kidney disease Mother   . Diabetes Mother   . Diabetes Sister   . Diabetes Brother   . Heart disease Brother   . Colon cancer Other        nephew  . Colon cancer Other        nephew  . Rectal cancer Other   . Stomach cancer Maternal Grandmother        thinks it was stomach cancer ??  . Esophageal cancer Neg Hx     Social History   Tobacco Use  . Smoking status: Never Smoker  . Smokeless tobacco: Never Used  Vaping Use  . Vaping Use: Never used  Substance Use Topics  . Alcohol use: Yes    Alcohol/week: 7.0 standard drinks    Types: 7 Glasses of wine per week    Comment: 1 glass of wine daily  . Drug use: No    Types: Hydrocodone    Comment: Pt stated she does  not takte hydrocodone anymore    ROS: As per history of present illness, otherwise negative  BP 128/68   Pulse 64   Ht 5' 3.5" (1.613 m)   Wt 130 lb (59 kg)   SpO2 99%   BMI 22.67 kg/m  Gen: awake, alert, NAD HEENT: anicteric  CV: RRR, no mrg Pulm: CTA b/l Abd: soft,  NT/ND, +BS throughout Ext: no c/c/e Neuro: nonfocal   RELEVANT LABS AND IMAGING: CBC    Component Value Date/Time   WBC 9.2 05/22/2020 0125   RBC 3.83 (L) 05/22/2020 0125   HGB 12.0 05/22/2020 0125   HGB 14.4 05/13/2020 1540   HCT 35.4 (L) 05/22/2020 0125   HCT 41.9 05/13/2020 1540   PLT 160 05/22/2020 0125   PLT 201 05/13/2020 1540   MCV 92.4 05/22/2020 0125   MCV 91 05/13/2020 1540   MCH 31.3 05/22/2020 0125   MCHC 33.9 05/22/2020 0125   RDW 11.9 05/22/2020 0125   RDW 12.4 05/13/2020 1540   LYMPHSABS 2.8 01/13/2016 1634   MONOABS 0.7 01/13/2016 1634   EOSABS 0.1 01/13/2016 1634   BASOSABS 0.0 01/13/2016 1634    CMP     Component Value Date/Time   NA 134 (L) 05/22/2020 0125   NA 131 (L) 05/13/2020 1540   K 3.8 05/22/2020 0125   CL 101 05/22/2020 0125   CO2 24 05/22/2020 0125   GLUCOSE 119 (H) 05/22/2020 0125   BUN 9 05/22/2020 0125   BUN 13 05/13/2020 1540   CREATININE 0.63 05/22/2020 0125   CALCIUM 8.8 (L) 05/22/2020 0125   PROT 6.1 (L) 07/01/2016 0305   ALBUMIN 3.8 07/01/2016 0305   AST 20 07/01/2016 0305   ALT 16 07/01/2016 0305   ALKPHOS 66 07/01/2016 0305   BILITOT 0.8 07/01/2016 0305   GFRNONAA >60 05/22/2020 0125   GFRAA 97 05/13/2020 1540    ASSESSMENT/PLAN: 86 year old female with a history of adenomatous colon polyps, colonic diverticulosis, CAD with very recent PCI (drug-eluting stent February 2022) started on Plavix, hypertension, hyperlipidemia, diabetes who is seen in consult at the request of Dr. Joylene Draft to discuss a positive Cologuard test.   1.  Positive Cologuard/personal history of adenomatous colon polyps --we had a discussion today regarding repeat colonoscopy.   There are complicating factors particularly with her recent drug-eluting stent placed earlier this month.  It is very likely that cardiology would not want to interrupt Plavix for 12 months.  Given the very high likelihood of the need for polypectomy, Plavix would need to be held 5 days prior to repeating her colonoscopy.  We also discussed the higher than average risk of colonoscopy at her age.  There is a slightly higher risk of complications including anesthesia related cardiopulmonary complications and bowel perforation.  With the high risk discussed, I still think repeat colonoscopy is appropriate for her once Plavix can be held appropriately. --Discussion with cardiology as to when Plavix can be held x5 days, once this is possible, we will plan diagnostic colonoscopy to evaluate Cologuard --I will forward this note to her cardiologist Dr. Gasper Sells and Dr. Joylene Draft    JH:ERDEYC, [place], [address] [address] [place],  [place] [address]

## 2020-05-30 ENCOUNTER — Telehealth: Payer: Self-pay | Admitting: *Deleted

## 2020-05-30 NOTE — Telephone Encounter (Signed)
-----   Message from Jerene Bears, MD sent at 05/30/2020  2:45 PM EST ----- Thanks Cecile Hearing, Please see this note from cardiology.  Repeat office visit with me in 6 months and we can discuss colonoscopy at that time for + Cologuard. Thanks JMP  ----- Message ----- From: Werner Lean, MD Sent: 05/28/2020   2:28 PM EST To: Jerene Bears, MD  Remonia Richter, If not urgent indication, I would like so keep her on DAPT for at least one month.  In general, for her indication, I treat to keep folks on for 6 months.  Based on how urgent you think she may need this, I will discuss it with her about when we could safely come off.  Thanks,  [employer] ----- Message ----- From: Jerene Bears, MD Sent: 05/28/2020   1:49 PM EST To: Werner Lean, MD  Aletta Edouard,  I saw Horris Latino today for + Cologuard.  She will need colonoscopy but just had PCI with DES earlier this month and is now on Plavix.  You will be seeing her on the 22nd and I would appreciate your opinion as to when we could safely hold Plavix for 5 days to perform colonoscopy Thanks Zenovia Jarred [facility]

## 2020-05-30 NOTE — Telephone Encounter (Signed)
Pyrtle, Lajuan Lines, MD  Werner Lean, MD; Larina Bras, CMA Thanks Cecile Hearing,  Please see this note from cardiology. Repeat office visit with me in 6 months and we can discuss colonoscopy at that time for + Cologuard.  Thanks  JMP    Previous Messages   ----- Message -----  From: Werner Lean, MD  Sent: 05/28/2020  2:28 PM EST  To: Jerene Bears, MD   Remonia Richter,  If not urgent indication, I would like so keep her on DAPT for at least one month. In general, for her indication, I treat to keep folks on for 6 months. Based on how urgent you think she may need this, I will discuss it with her about when we could safely come off.   Thanks,   [employer]  ----- Message -----  From: Jerene Bears, MD  Sent: 05/28/2020  1:49 PM EST  To: Werner Lean, MD   Aletta Edouard,  I saw Horris Latino today for + Cologuard. She will need colonoscopy but just had PCI with DES earlier this month and is now on Plavix. You will be seeing her on the 22nd and I would appreciate your opinion as to when we could safely hold Plavix for 5 days to perform colonoscopy  Thanks  Zenovia Jarred  [facility]

## 2020-05-30 NOTE — Telephone Encounter (Signed)
I have spoken to patient to advise of Dr Oralia Rud and Pyrtle's recommendations. She verbalizes understanding. We will be back in touch with her when it gets closer to 11/2020.

## 2020-05-31 ENCOUNTER — Telehealth (HOSPITAL_COMMUNITY): Payer: Self-pay | Admitting: Pharmacist

## 2020-05-31 ENCOUNTER — Telehealth (HOSPITAL_COMMUNITY): Payer: Self-pay

## 2020-05-31 NOTE — Progress Notes (Signed)
Error

## 2020-05-31 NOTE — Telephone Encounter (Signed)
Pharmacy Transitions of Care Follow-up Telephone Call  Date of discharge: 05/22/20  Discharge Diagnosis: coronary stent  How have you been since you were released from the hospital? Patient has been well. Knows s/sx of bleeding to look for  Medication changes made at discharge: yes   Medication changes obtained and verified? yes    Medication Accessibility:  [facility]acy: [facility] [address]  Was the patient provided with refills on discharged medications? yes  Have all prescriptions been transferred from [facility] to home pharmacy? yes  . Is the patient able to afford medications? yes    Medication Review:  CLOPIDOGREL (PLAVIX) Clopidogrel 75 mg once daily.  - Educated patient on expected duration of therapy of 1 year with clopidogrel. Advised patient that aspirin will be continued indefinitely.  - Advised patient of medications to avoid (NSAIDs, ASA)  - Educated that Tylenol (acetaminophen) will be the preferred analgesic to prevent risk of bleeding  - Emphasized importance of monitoring for signs and symptoms of bleeding (abnormal bruising, prolonged bleeding, nose bleeds, bleeding from gums, discolored urine, black tarry stools)  - Advised patient to alert all providers of anticoagulation therapy prior to starting a new medication or having a procedure     Follow-up Appointments:  Follow up with cardiology on 06/04/20.   If their condition worsens, is the pt aware to call PCP or go to the Emergency Dept.? yes  Final Patient Assessment: Patient is well. Refills sent to home pharmacy and has f/u scheduled.

## 2020-06-03 NOTE — Progress Notes (Signed)
Patient ID: Christina [name]                 DOB: 07/13/1934                    MRN: [id number]     HPI: Christina [name] is a 86 y.o. female patient referred to lipid clinic by Dr. Angelena [name]. PMH is significant for CAD s/p PCI/stenting of the mid RCA on 05/22/20, T2DM, HTN, HLD, and CCTA with concern for obstructive disease with elevated calcium score of 1418 (93rd percentile for age and gender).  At last visit with Dr. Gasper [name] on 05/13/20, patient reported chest pain when feeling worked up and dyspnea on exertion. Provider and patient agreed on LHC. LHC on 05/20/20 showed mild non-obstructive disease in the distal left main artery, proximal LAD and moderate caliber intermediate branch.The RCA is a large dominant vessel with diffuse calcification throughout. The mid RCA has a focal, severe, calcified stenosis. Successful PCI of the mid RCA with OCT guidance and orbital atherectomy and DES x 1. Additionally, statin initiation was recommended, however pt deferred therapy; and provider recommended discontinuing OTC fish oil due to lack of CV benefit.  Patient presents today in good spirits accompanied with her son. Patient reports medication adherence with ezetimibe 10 mg daily. Denies myalgias. Reports intolerance to atorvastatin due to muscle pain, however reports no previous use with rosuvastatin. Also, reports taking OTC fish oil 2 g daily and niacin 500 mg daily.   Current Medications: ezetimibe 10 mg daily, fish oil 2 g daily, niacin 500 mg daily Intolerances: atorvastatin 80 mg daily (muscle pain) Risk Factors: CAD s/p stent, T2DM, HTN, Fhx of heart disease LDL goal: <55 mg/dL  Family History: Colon cancer in her brother and other family members; Diabetes in her brother, mother, and sister; Heart disease in her brother and mother; Kidney disease in her mother; Rectal cancer in an other family member; Stomach cancer in her maternal grandmother.  History of coronary artery disease notable for  brother.  History of heart failure notable for uncles.  Social History: denies tobacco use; 7 glasses of wine per week   Labs: 03/06/2020: LDL 97, TC 182, HDL 62, NonHDL 120, TG 114 (ezetimibe 10 mg daily) 07/01/2016: LDL 101, TG 133, TC 174, HDL 46 (ezetimibe 10 mg daily)  Past Medical History:  Diagnosis Date  . Anemia, unspecified   . Cataract   . Chest pain   . Coronary atherosclerosis of native coronary artery   . Depressive disorder, not elsewhere classified   . Diabetes mellitus   . Disorder of bone and cartilage, unspecified   . Diverticulosis of colon (without mention of hemorrhage)   . External hemorrhoids without mention of complication   . Family history of malignant neoplasm of gastrointestinal tract   . H/O: hysterectomy   . History of back surgery 2013    spinal stenosis 7 31 [place]  . Hx of dislocation of shoulder    right  . Hyperlipidemia    134 12-2013 per pt. on meds  . Hypertension   . Insomnia, unspecified   . Lumbago   . Personal history of colonic polyps   . Squamous cell carcinoma of skin 12/20/2017   well diff-right upper forearm (txpbx)  . Unspecified adverse effect of other drug, medicinal and biological substance(995.29)   . Unspecified hypothyroidism   . Unspecified vitamin D deficiency   . Urinary frequency   . Urinary tract infection, site not specified  Current Outpatient Medications on File Prior to Visit  Medication Sig Dispense Refill  . aspirin EC 81 MG tablet Take 1 tablet (81 mg total) by mouth daily. Swallow whole. 90 tablet 3  . Cholecalciferol (VITAMIN D-3) 125 MCG (5000 UT) TABS Take 5,000 Units by mouth daily.    . clopidogrel (PLAVIX) 75 MG tablet Take 1 tablet (75 mg total) by mouth daily with breakfast. 90 tablet 3  . Cyanocobalamin (VITAMIN B 12 PO) Take 5,000 Units by mouth daily.    Marland Kitchen escitalopram (LEXAPRO) 10 MG tablet Take 10 mg by mouth daily.    Marland Kitchen ezetimibe (ZETIA) 10 MG tablet Take 10 mg by mouth daily.     . fish oil-omega-3 fatty acids 1000 MG capsule Take 1 g by mouth daily as needed (if wat something with fat).    . irbesartan (AVAPRO) 300 MG tablet Take 300 mg by mouth at bedtime.    Marland Kitchen levothyroxine (SYNTHROID, LEVOTHROID) 50 MCG tablet TAKE 1 TABLET BY MOUTH EVERY DAY (Patient taking differently: Take 50 mcg by mouth daily before breakfast.) 30 tablet 5  . LORazepam (ATIVAN) 2 MG tablet Take 2 mg by mouth at bedtime.    . Menaquinone-7 (VITAMIN K2 PO) Take 1 tablet by mouth daily. MK2 (Patient not taking: Reported on 05/28/2020)    . Multiple Vitamins-Minerals (MULTIVITAMIN WITH MINERALS) tablet Take 1 tablet by mouth daily. Centrum Silver    . niacin 500 MG tablet Take 500 mg by mouth daily.    . nitroGLYCERIN (NITROSTAT) 0.4 MG SL tablet Place 1 tablet (0.4 mg total) under the tongue every 5 (five) minutes as needed for chest pain. 25 tablet 11   No current facility-administered medications on file prior to visit.    Allergies  Allergen Reactions  . Atorvastatin Other (See Comments)    Muscle pain  . Codeine     itching    Assessment/Plan:  1. Hyperlipidemia - LDL above goal <55 mg/dL give history of CAD s/p stent, T2DM, HTN, and significant Fhx of heart disease. Medication adherence appears optimal with ezetimibe 10 mg daily. Pt intolerant to atorvastatin due to muscle pain. Discussed initiating rosuvastatin due to its lower rates of myalgia secondary to hydrophilic mechanism. Patient agreed to start rosuvastatin 10 mg daily. Continued ezetimibe 10 mg daily. Additionally, discussed discontinuing OTC fish oil and niacin due to little carvdiovascular protection and pt's normal triglycerides levels. Patient verbalized agreement. Due to time constraints, unable to address diet and exercise. Follow-up fasting lipid panel and LFTs scheduled in 3 months. Can change to Nexlizet or add on PCSK9i therapy if needed at that time.  Christina [name], PharmD, [place] [address], [place], [place] [address] Phone: [phone]; Fax: [phone]

## 2020-06-04 ENCOUNTER — Ambulatory Visit (INDEPENDENT_AMBULATORY_CARE_PROVIDER_SITE_OTHER): Payer: Medicare Other | Admitting: Internal Medicine

## 2020-06-04 ENCOUNTER — Other Ambulatory Visit: Payer: Self-pay

## 2020-06-04 ENCOUNTER — Ambulatory Visit (INDEPENDENT_AMBULATORY_CARE_PROVIDER_SITE_OTHER): Payer: Medicare Other | Admitting: Pharmacist

## 2020-06-04 ENCOUNTER — Encounter: Payer: Self-pay | Admitting: Internal Medicine

## 2020-06-04 VITALS — BP 120/72 | HR 65 | Ht 63.0 in | Wt 130.0 lb

## 2020-06-04 DIAGNOSIS — I2510 Atherosclerotic heart disease of native coronary artery without angina pectoris: Secondary | ICD-10-CM | POA: Diagnosis not present

## 2020-06-04 DIAGNOSIS — E782 Mixed hyperlipidemia: Secondary | ICD-10-CM | POA: Diagnosis not present

## 2020-06-04 DIAGNOSIS — M79601 Pain in right arm: Secondary | ICD-10-CM

## 2020-06-04 DIAGNOSIS — I10 Essential (primary) hypertension: Secondary | ICD-10-CM

## 2020-06-04 DIAGNOSIS — E119 Type 2 diabetes mellitus without complications: Secondary | ICD-10-CM

## 2020-06-04 MED ORDER — ROSUVASTATIN CALCIUM 10 MG PO TABS: 10.0000 mg | ORAL_TABLET | Freq: Every day | ORAL | 3 refills | Status: DC

## 2020-06-04 NOTE — Patient Instructions (Addendum)
Nice to see you today!  Keep up the good work with diet and exercise. Aim for a diet full of vegetables, fruit and lean meats (chicken, [place], fish). Try to limit carbs (bread, pasta, sugar, rice) and red meat consumption.  Your goal LDL is <55 mg/dL, you're currently at 97 mg/dL  Medication Changes: Begin taking rosuvastatin (Crestor) 10 mg daily  Continue ezetimibe (zetia) 10 mg daily  Discontinue Niacin and fish oil. These products do not have heart protection and your triglyceride levels are normal.  Please give [place] a call at [phone] with any questions or concerns.  For PCSK9i, inject once every other week (any day of the week that works for you) into the fatty skin of stomach, upper outer thigh or back of the arm. Clean the site with soap and warm water or an alcohol pad. Keep the medication in the fridge until you are ready to give your dose, then take it out and let warm up to room temperature for 30-60 mins.
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
## 2020-06-04 NOTE — Patient Instructions (Signed)
Medication Instructions:  Your physician recommends that you continue on your current medications as directed. Please refer to the Current Medication list given to you today.  *If you need a refill on your cardiac medications before your next appointment, please call your pharmacy*   Lab Work: NONE If you have labs (blood work) drawn today and your tests are completely normal, you will receive your results only by: Marland Kitchen MyChart Message (if you have MyChart) OR . A paper copy in the mail If you have any lab test that is abnormal or we need to change your treatment, we will call you to review the results.   Testing/Procedures: Your physician has requested that you have a  Upper right extremity arterial duplex. This test is an ultrasound of the arteries in the right arm. It looks at arterial blood flow in the right arm. Allow one hour for Upper Arterial scan. There are no restrictions or special instructions    Follow-Up: At [facility], you and your health needs are our priority.  As part of our continuing mission to provide you with exceptional heart care, we have created designated Provider Care Teams.  These Care Teams include your primary Cardiologist (physician) and Advanced Practice Providers (APPs -  Physician Assistants and Nurse Practitioners) who all work together to provide you with the care you need, when you need it.  We recommend signing up for the patient portal called "MyChart".  Sign up information is provided on this After Visit Summary.  MyChart is used to connect with patients for Virtual Visits (Telemedicine).  Patients are able to view lab/test results, encounter notes, upcoming appointments, etc.  Non-urgent messages can be sent to your provider as well.   To learn more about what you can do with MyChart, go to [email or web address].    Your next appointment:   6 month(s)  The format for your next appointment:   In Person  Provider:   You may see Werner Lean, MD or one of the following Advanced Practice Providers on your designated Care Team:    Melina Copa, PA-C  Ermalinda Barrios, PA-C

## 2020-06-13 ENCOUNTER — Ambulatory Visit (HOSPITAL_COMMUNITY)
Admission: RE | Admit: 2020-06-13 | Payer: Medicare Other | Source: Ambulatory Visit | Attending: Internal Medicine | Admitting: Internal Medicine

## 2020-06-14 ENCOUNTER — Telehealth (HOSPITAL_COMMUNITY): Payer: Self-pay | Admitting: *Deleted

## 2020-06-14 NOTE — Telephone Encounter (Signed)
-----   Message from Werner Lean, MD sent at 06/14/2020 10:46 AM EST ----- Regarding: RE: Ok to proceed with scheduling Cardiac Rehab Yes Thanks.  Her Exam was benign but patient has initially wanted to make sure and I offered the duplex.   ----- Message ----- From: Rowe Pavy, RN Sent: 06/14/2020   9:54 AM EST To: Werner Lean, MD Subject: Ok to proceed with scheduling Cardiac Rehab     Dr. Gasper Sells,  The above pt eligible to participate in CR s/p DES to RCA on 2/8.  Seen by you in follow up on 2/22.  Arterial duplex ordered due to pt right arm pain and hematoma.  This was scheduled for 3/3 - Pt called to cancel her appt since her arm felt better.  Are you okay with proceeding with scheduling or any further evaluation warranted?  Thank you for the advisement.  Cherre Huger, BSN Cardiac and Training and development officer

## 2020-06-28 ENCOUNTER — Encounter (HOSPITAL_COMMUNITY): Payer: Self-pay

## 2020-06-28 ENCOUNTER — Telehealth (HOSPITAL_COMMUNITY): Payer: Self-pay

## 2020-06-28 NOTE — Telephone Encounter (Signed)
Attempted to call patient in regards to Cardiac Rehab - LM on VM Mailed letter 

## 2020-07-09 ENCOUNTER — Other Ambulatory Visit: Payer: Self-pay

## 2020-07-09 ENCOUNTER — Ambulatory Visit (INDEPENDENT_AMBULATORY_CARE_PROVIDER_SITE_OTHER): Payer: Medicare Other | Admitting: Dermatology

## 2020-07-09 ENCOUNTER — Encounter: Payer: Self-pay | Admitting: Dermatology

## 2020-07-09 ENCOUNTER — Ambulatory Visit: Payer: Medicare Other | Admitting: Dermatology

## 2020-07-09 DIAGNOSIS — L570 Actinic keratosis: Secondary | ICD-10-CM | POA: Diagnosis not present

## 2020-07-09 DIAGNOSIS — Z1283 Encounter for screening for malignant neoplasm of skin: Secondary | ICD-10-CM

## 2020-07-09 DIAGNOSIS — I872 Venous insufficiency (chronic) (peripheral): Secondary | ICD-10-CM | POA: Diagnosis not present

## 2020-07-09 DIAGNOSIS — R208 Other disturbances of skin sensation: Secondary | ICD-10-CM

## 2020-07-09 NOTE — Progress Notes (Signed)
   Follow-Up Visit   Subjective  Christina [name] is a 86 y.o. female who presents for the following: Annual Exam (Full body skin check. Dark spots, crusty looking on top of right hand, right forearm. Per patient feels like a wart doesn't remember which leg. Wants her scalp to be looked at for psoriasis, has never been diagnosed. Just itches really bad per patient. //Patient has had stent put in heart May 20, 2020).  Crusts Location: Mostly arms Duration:  Quality:  Associated Signs/Symptoms: Modifying Factors:  Severity:  Timing: Context: Also itching on scalp.  Objective  Well appearing patient in no apparent distress; mood and affect are within normal limits. Objective  Right Dorsal Hand, Right Forearm - Posterior: Erythematous patches with gritty scale.  Objective  Right Upper Back: No atypical nevi or signs of NMSC noted at the time of the visit.     A full examination was performed including scalp, head, eyes, ears, nose, lips, neck, chest, axillae, abdomen, back, buttocks, bilateral upper extremities, bilateral lower extremities, hands, feet, fingers, toes, fingernails, and toenails. All findings within normal limits unless otherwise noted below.  Areas beneath undergarments not examined.   Assessment & Plan    AK (actinic keratosis) (2) Right Forearm - Posterior; Right Dorsal Hand  Destruction of lesion - Right Dorsal Hand, Right Forearm - Posterior Complexity: simple   Destruction method: cryotherapy   Informed consent: discussed and consent obtained   Timeout:  patient name, date of birth, surgical site, and procedure verified Lesion destroyed using liquid nitrogen: Yes   Cryotherapy cycles:  3 Outcome: patient tolerated procedure well with no complications    Encounter for screening for malignant neoplasm of skin Right Upper Back  Yearly skin check    Discussed stasis dermatitis and easy bruising on lower legs.  Froze actinic keratoses on the left shin and  2 spots on the right arm.  I, Lavonna Monarch, MD, have reviewed all documentation for this visit.  The documentation on 07/09/20 for the exam, diagnosis, procedures, and orders are all accurate and complete.

## 2020-07-11 ENCOUNTER — Telehealth (HOSPITAL_COMMUNITY): Payer: Self-pay

## 2020-07-11 ENCOUNTER — Other Ambulatory Visit: Payer: Self-pay

## 2020-07-11 ENCOUNTER — Encounter (HOSPITAL_COMMUNITY): Payer: Self-pay

## 2020-07-11 ENCOUNTER — Encounter (HOSPITAL_COMMUNITY)
Admission: RE | Admit: 2020-07-11 | Discharge: 2020-07-11 | Disposition: A | Discharge status: Home / Self Care | Payer: Medicare Other | Source: Ambulatory Visit | Attending: Internal Medicine | Admitting: Internal Medicine

## 2020-07-11 VITALS — BP 122/74 | HR 67 | Ht 61.75 in | Wt 127.9 lb

## 2020-07-11 DIAGNOSIS — Z955 Presence of coronary angioplasty implant and graft: Secondary | ICD-10-CM | POA: Insufficient documentation

## 2020-07-11 NOTE — Progress Notes (Signed)
Cardiac Individual Treatment Plan  Patient Details  Name: Christina [name] MRN: [id number] Date of Birth: [birth year] Referring Provider:   Flowsheet Row CARDIAC REHAB PHASE II ORIENTATION from 07/11/2020 in [facility]  Referring Provider Rudean Haskell, MD      Initial Encounter Date:  [place] PHASE II ORIENTATION from 07/11/2020 in [facility]  Date 07/11/20      Visit Diagnosis: 05/21/20 S/P DES RCA   Patient's Home Medications on Admission:  Current Outpatient Medications:  .  Cholecalciferol (VITAMIN D-3) 125 MCG (5000 UT) TABS, Take 5,000 Units by mouth daily., Disp: , Rfl:  .  clopidogrel (PLAVIX) 75 MG tablet, Take 1 tablet (75 mg total) by mouth daily with breakfast., Disp: 90 tablet, Rfl: 3 .  Cyanocobalamin (VITAMIN B 12 PO), Take 5,000 Units by mouth daily., Disp: , Rfl:  .  escitalopram (LEXAPRO) 10 MG tablet, Take 10 mg by mouth daily., Disp: , Rfl:  .  ezetimibe (ZETIA) 10 MG tablet, Take 10 mg by mouth daily., Disp: , Rfl:  .  gabapentin (NEURONTIN) 600 MG tablet, Take 600 mg by mouth 3 (three) times daily., Disp: , Rfl:  .  irbesartan (AVAPRO) 300 MG tablet, Take 300 mg by mouth at bedtime., Disp: , Rfl:  .  levothyroxine (SYNTHROID, LEVOTHROID) 50 MCG tablet, TAKE 1 TABLET BY MOUTH EVERY DAY, Disp: 30 tablet, Rfl: 5 .  LORazepam (ATIVAN) 2 MG tablet, Take 2 mg by mouth at bedtime., Disp: , Rfl:  .  Menaquinone-7 (VITAMIN K2 PO), Take 1 tablet by mouth daily. MK2, Disp: , Rfl:  .  Multiple Vitamins-Minerals (MULTIVITAMIN WITH MINERALS) tablet, Take 1 tablet by mouth daily. Centrum Silver, Disp: , Rfl:  .  nitroGLYCERIN (NITROSTAT) 0.4 MG SL tablet, Place 1 tablet (0.4 mg total) under the tongue every 5 (five) minutes as needed for chest pain., Disp: 25 tablet, Rfl: 11 .  rosuvastatin (CRESTOR) 10 MG tablet, Take 1 tablet (10 mg total) by mouth daily., Disp: 90 tablet, Rfl: 3 .  aspirin  EC 81 MG tablet, Take 1 tablet (81 mg total) by mouth daily. Swallow whole., Disp: 90 tablet, Rfl: 3 .  fish oil-omega-3 fatty acids 1000 MG capsule, Take 1 g by mouth daily as needed (if wat something with fat). (Patient not taking: Reported on 07/11/2020), Disp: , Rfl:  .  niacin 500 MG tablet, Take 500 mg by mouth daily. (Patient not taking: Reported on 07/11/2020), Disp: , Rfl:   Past Medical History: Past Medical History:  Diagnosis Date  . Anemia, unspecified   . Cataract   . Chest pain   . Coronary atherosclerosis of native coronary artery   . Depressive disorder, not elsewhere classified   . Diabetes mellitus   . Disorder of bone and cartilage, unspecified   . Diverticulosis of colon (without mention of hemorrhage)   . External hemorrhoids without mention of complication   . Family history of malignant neoplasm of gastrointestinal tract   . H/O: hysterectomy   . History of back surgery 2013    spinal stenosis 7 31 [place]  . Hx of dislocation of shoulder    right  . Hyperlipidemia    134 12-2013 per pt. on meds  . Hypertension   . Insomnia, unspecified   . Lumbago   . Personal history of colonic polyps   . Squamous cell carcinoma of skin 12/20/2017   well diff-right upper forearm (txpbx)  . Unspecified  adverse effect of other drug, medicinal and biological substance(995.29)   . Unspecified hypothyroidism   . Unspecified vitamin D deficiency   . Urinary frequency   . Urinary tract infection, site not specified     Tobacco Use: Social History   Tobacco Use  Smoking Status Never Smoker  Smokeless Tobacco Never Used    Labs: Recent Review Flowsheet Data    Labs for ITP Cardiac and Pulmonary Rehab Latest Ref Rng & Units 02/12/2012 05/11/2013 09/10/2015 06/30/2016 07/01/2016   Cholestrol 0 - 200 mg/dL 152 167 - - 174   LDLCALC 0 - 99 mg/dL 79 91 - - 101(H)   LDLDIRECT mg/dL - - - - -   HDL >40 mg/dL 54.10 57.80 - - 46   Trlycerides <150 mg/dL 93.0 91.0 - - 133    Hemoglobin A1c 4.8 - 5.6 % 6.4 - - 6.3(H) -   TCO2 0 - 100 mmol/L - - 22 - -      Capillary Blood Glucose: Lab Results  Component Value Date   GLUCAP 142 (H) 05/20/2020   GLUCAP 89 07/01/2016     Exercise Target Goals: Exercise Program Goal: Individual exercise prescription set using results from initial 6 min walk test and THRR while considering  patient's activity barriers and safety.   Exercise Prescription Goal: Starting with aerobic activity 30 plus minutes a day, 3 days per week for initial exercise prescription. Provide home exercise prescription and guidelines that participant acknowledges understanding prior to discharge.  Activity Barriers & Risk Stratification:  Activity Barriers & Cardiac Risk Stratification - 07/11/20 1455      Activity Barriers & Cardiac Risk Stratification   Activity Barriers Joint Problems;Balance Concerns;Back Problems;Neck/Spine Problems    Cardiac Risk Stratification High           6 Minute Walk:  6 Minute Walk    Row Name 07/11/20 1359         6 Minute Walk   Phase Initial     Distance 1376 feet     Walk Time 6 minutes     # of Rest Breaks 0     MPH 2.6     METS 2.35     RPE 13     Perceived Dyspnea  1     VO2 Peak 8.24     Symptoms Yes (comment)     Comments SOB, RPD = 1     Resting HR 70 bpm     Resting BP 122/74     Resting Oxygen Saturation  96 %     Exercise Oxygen Saturation  during 6 min walk 96 %     Max Ex. HR 93 bpm     Max Ex. BP 140/80     2 Minute Post BP 128/72            Oxygen Initial Assessment:   Oxygen Re-Evaluation:   Oxygen Discharge (Final Oxygen Re-Evaluation):   Initial Exercise Prescription:  Initial Exercise Prescription - 07/11/20 1400      Date of Initial Exercise RX and Referring Provider   Date 07/11/20    Referring Provider Rudean Haskell, MD    Expected Discharge Date 09/06/20      NuStep   Level 2    SPM 75    Minutes 15    METs 2      Track   Laps 12     Minutes 15    METs 2.39      Prescription Details  Frequency (times per week) 3    Duration Progress to 30 minutes of continuous aerobic without signs/symptoms of physical distress      Intensity   THRR 40-80% of Max Heartrate 54-109    Ratings of Perceived Exertion 11-13    Perceived Dyspnea 0-4      Progression   Progression Continue progressive overload as per policy without signs/symptoms or physical distress.      Resistance Training   Training Prescription Yes    Weight 2 lbs    Reps 10-15           Perform Capillary Blood Glucose checks as needed.  Exercise Prescription Changes:   Exercise Comments:   Exercise Goals and Review:   Exercise Goals    Row Name 07/11/20 1456             Exercise Goals   Increase Physical Activity Yes       Intervention Provide advice, education, support and counseling about physical activity/exercise needs.;Develop an individualized exercise prescription for aerobic and resistive training based on initial evaluation findings, risk stratification, comorbidities and participant's personal goals.       Expected Outcomes Short Term: Attend rehab on a regular basis to increase amount of physical activity.;Long Term: Add in home exercise to make exercise part of routine and to increase amount of physical activity.;Long Term: Exercising regularly at least 3-5 days a week.       Increase Strength and Stamina Yes       Intervention Provide advice, education, support and counseling about physical activity/exercise needs.;Develop an individualized exercise prescription for aerobic and resistive training based on initial evaluation findings, risk stratification, comorbidities and participant's personal goals.       Expected Outcomes Short Term: Increase workloads from initial exercise prescription for resistance, speed, and METs.;Short Term: Perform resistance training exercises routinely during rehab and add in resistance training at home;Long  Term: Improve cardiorespiratory fitness, muscular endurance and strength as measured by increased METs and functional capacity (6MWT)       Able to understand and use rate of perceived exertion (RPE) scale Yes       Intervention Provide education and explanation on how to use RPE scale       Expected Outcomes Short Term: Able to use RPE daily in rehab to express subjective intensity level;Long Term:  Able to use RPE to guide intensity level when exercising independently       Knowledge and understanding of Target Heart Rate Range (THRR) Yes       Intervention Provide education and explanation of THRR including how the numbers were predicted and where they are located for reference       Expected Outcomes Short Term: Able to state/look up THRR;Short Term: Able to use daily as guideline for intensity in rehab;Long Term: Able to use THRR to govern intensity when exercising independently       Understanding of Exercise Prescription Yes       Intervention Provide education, explanation, and written materials on patient's individual exercise prescription       Expected Outcomes Short Term: Able to explain program exercise prescription;Long Term: Able to explain home exercise prescription to exercise independently              Exercise Goals Re-Evaluation :    Discharge Exercise Prescription (Final Exercise Prescription Changes):   Nutrition:  Target Goals: Understanding of nutrition guidelines, daily intake of sodium 1500mg , cholesterol 200mg , calories 30% from fat and 7% or less  from saturated fats, daily to have 5 or more servings of fruits and vegetables.  Biometrics:  Pre Biometrics - 07/11/20 1315      Pre Biometrics   Waist Circumference 33 inches    Hip Circumference 39.5 inches    Waist to Hip Ratio 0.84 %    Triceps Skinfold 17 mm    % Body Fat 35 %    Grip Strength 24 kg    Flexibility 18 in    Single Leg Stand 3.12 seconds            Nutrition Therapy Plan and  Nutrition Goals:   Nutrition Assessments:  MEDIFICTS Score Key:  ?70 Need to make dietary changes   40-70 Heart Healthy Diet  ? 40 Therapeutic Level Cholesterol Diet   Picture Your Plate Scores:  <74 Unhealthy dietary pattern with much room for improvement.  41-50 Dietary pattern unlikely to meet recommendations for good health and room for improvement.  51-60 More healthful dietary pattern, with some room for improvement.   >60 Healthy dietary pattern, although there may be some specific behaviors that could be improved.    Nutrition Goals Re-Evaluation:   Nutrition Goals Discharge (Final Nutrition Goals Re-Evaluation):   Psychosocial: Target Goals: Acknowledge presence or absence of significant depression and/or stress, maximize coping skills, provide positive support system. Participant is able to verbalize types and ability to use techniques and skills needed for reducing stress and depression.  Initial Review & Psychosocial Screening:  Initial Psych Review & Screening - 07/11/20 1348      Initial Review   Current issues with History of Depression      Family Dynamics   Good Support System? Yes   Christina [name] lives alone. Christina [name] has her son for support who lives nearby if needed     Barriers   Psychosocial barriers to participate in program The patient should benefit from training in stress management and relaxation.;There are no identifiable barriers or psychosocial needs.      Screening Interventions   Interventions Encouraged to exercise;Provide feedback about the scores to participant    Expected Outcomes Long Term Goal: Stressors or current issues are controlled or eliminated.;Short Term goal: Identification and review with participant of any Quality of Life or Depression concerns found by scoring the questionnaire.;Long Term goal: The participant improves quality of Life and PHQ9 Scores as seen by post scores and/or verbalization of changes           Quality  of Life Scores:  Quality of Life - 07/11/20 1441      Quality of Life   Select Quality of Life      Quality of Life Scores   Health/Function Pre 22.1 %    Socioeconomic Pre 22.67 %    Psych/Spiritual Pre 23 %    Family Pre 15.7 %    GLOBAL Pre 21.7 %          Scores of 19 and below usually indicate a poorer quality of life in these areas.  A difference of  2-3 points is a clinically meaningful difference.  A difference of 2-3 points in the total score of the Quality of Life Index has been associated with significant improvement in overall quality of life, self-image, physical symptoms, and general health in studies assessing change in quality of life.  PHQ-9: Recent Review Flowsheet Data    Depression screen [facility] 2/9 07/11/2020   Decreased Interest 0   Down, Depressed, Hopeless 0   PHQ - 2 Score 0  Interpretation of Total Score  Total Score Depression Severity:  1-4 = Minimal depression, 5-9 = Mild depression, 10-14 = Moderate depression, 15-19 = Moderately severe depression, 20-27 = Severe depression   Psychosocial Evaluation and Intervention:   Psychosocial Re-Evaluation:   Psychosocial Discharge (Final Psychosocial Re-Evaluation):   Vocational Rehabilitation: Provide vocational rehab assistance to qualifying candidates.   Vocational Rehab Evaluation & Intervention:  Vocational Rehab - 07/11/20 1349      Initial Vocational Rehab Evaluation & Intervention   Assessment shows need for Vocational Rehabilitation No   Christina [name] is retired and does not need vocatinal rehab at this time          Education: Education Goals: Education classes will be provided on a weekly basis, covering required topics. Participant will state understanding/return demonstration of topics presented.  Learning Barriers/Preferences:  Learning Barriers/Preferences - 07/11/20 1441      Learning Barriers/Preferences   Learning Barriers Hearing;Sight   Wears glasses and hearing aids    Learning Preferences None           Education Topics: Hypertension, Hypertension Reduction -Define heart disease and high blood pressure. Discus how high blood pressure affects the body and ways to reduce high blood pressure.   Exercise and Your Heart -Discuss why it is important to exercise, the FITT principles of exercise, normal and abnormal responses to exercise, and how to exercise safely.   Angina -Discuss definition of angina, causes of angina, treatment of angina, and how to decrease risk of having angina.   Cardiac Medications -Review what the following cardiac medications are used for, how they affect the body, and side effects that may occur when taking the medications.  Medications include Aspirin, Beta blockers, calcium channel blockers, ACE Inhibitors, angiotensin receptor blockers, diuretics, digoxin, and antihyperlipidemics.   Congestive Heart Failure -Discuss the definition of CHF, how to live with CHF, the signs and symptoms of CHF, and how keep track of weight and sodium intake.   Heart Disease and Intimacy -Discus the effect sexual activity has on the heart, how changes occur during intimacy as we age, and safety during sexual activity.   Smoking Cessation / COPD -Discuss different methods to quit smoking, the health benefits of quitting smoking, and the definition of COPD.   Nutrition I: Fats -Discuss the types of cholesterol, what cholesterol does to the heart, and how cholesterol levels can be controlled.   Nutrition II: Labels -Discuss the different components of food labels and how to read food label   Heart Parts/Heart Disease and PAD -Discuss the anatomy of the heart, the pathway of blood circulation through the heart, and these are affected by heart disease.   Stress I: Signs and Symptoms -Discuss the causes of stress, how stress may lead to anxiety and depression, and ways to limit stress.   Stress II: Relaxation -Discuss different  types of relaxation techniques to limit stress.   Warning Signs of Stroke / TIA -Discuss definition of a stroke, what the signs and symptoms are of a stroke, and how to identify when someone is having stroke.   Knowledge Questionnaire Score:  Knowledge Questionnaire Score - 07/11/20 1440      Knowledge Questionnaire Score   Pre Score 18/24           Core Components/Risk Factors/Patient Goals at Admission:  Personal Goals and Risk Factors at Admission - 07/11/20 1445      Core Components/Risk Factors/Patient Goals on Admission    Weight Management Weight Maintenance    Intervention  Weight Management: Provide education and appropriate resources to help participant work on and attain dietary goals.;Weight Management: Develop a combined nutrition and exercise program designed to reach desired caloric intake, while maintaining appropriate intake of nutrient and fiber, sodium and fats, and appropriate energy expenditure required for the weight goal.    Admit Weight 127 lb 13.9 oz (58 kg)    Expected Outcomes Weight Maintenance: Understanding of the daily nutrition guidelines, which includes 25-35% calories from fat, 7% or less cal from saturated fats, less than 200mg  cholesterol, less than 1.5gm of sodium, & 5 or more servings of fruits and vegetables daily;Long Term: Adherence to nutrition and physical activity/exercise program aimed toward attainment of established weight goal;Short Term: Continue to assess and modify interventions until short term weight is achieved;Understanding recommendations for meals to include 15-35% energy as protein, 25-35% energy from fat, 35-60% energy from carbohydrates, less than 200mg  of dietary cholesterol, 20-35 gm of total fiber daily;Understanding of distribution of calorie intake throughout the day with the consumption of 4-5 meals/snacks    Hypertension Yes    Intervention Monitor prescription use compliance.;Provide education on lifestyle modifcations  including regular physical activity/exercise, weight management, moderate sodium restriction and increased consumption of fresh fruit, vegetables, and low fat dairy, alcohol moderation, and smoking cessation.    Expected Outcomes Short Term: Continued assessment and intervention until BP is < 140/55mm HG in hypertensive participants. < 130/4mm HG in hypertensive participants with diabetes, heart failure or chronic kidney disease.;Long Term: Maintenance of blood pressure at goal levels.    Lipids Yes    Intervention Provide education and support for participant on nutrition & aerobic/resistive exercise along with prescribed medications to achieve LDL 70mg , HDL >40mg .    Expected Outcomes Short Term: Participant states understanding of desired cholesterol values and is compliant with medications prescribed. Participant is following exercise prescription and nutrition guidelines.;Long Term: Cholesterol controlled with medications as prescribed, with individualized exercise RX and with personalized nutrition plan. Value goals: LDL < 70mg , HDL > 40 mg.           Core Components/Risk Factors/Patient Goals Review:    Core Components/Risk Factors/Patient Goals at Discharge (Final Review):    ITP Comments:  ITP Comments    Row Name 07/11/20 1347           ITP Comments Dr Fransico Him MD, Medical Director              Comments: Marthann attended orientation on 07/11/2020 to review rules and guidelines for program.  Completed 6 minute walk test, Intitial ITP, and exercise prescription.  VSS. Telemetry-Sinus Rhythm with PAC's.  Asymptomatic. Safety measures and social distancing in place per CDC guidelines.Barnet Pall, RN,BSN 07/11/2020 3:29 PM

## 2020-07-11 NOTE — Telephone Encounter (Signed)
Called patient to see if she was interested in participating in the Cardiac Rehab Program. Patient stated yes. Patient will come in for orientation on 07/11/2020@1 :15pm and will attend the 9:15am exercise class.

## 2020-07-11 NOTE — Progress Notes (Signed)
Completed medical history via phone with Christina [name] for her upcoming appt later today for Cardiac rehab.   Cardiac Rehab Medication Review    Does the patient  feel that his/her medications are working for him/her?  yes  Has the patient been experiencing any side effects to the medications prescribed?  no  Does the patient measure his/her own blood pressure or blood glucose at home?  yes   Does the patient have any problems obtaining medications due to transportation or finances?   no  Understanding of regimen: excellent Understanding of indications: excellent Potential of compliance: excellent    Allergies reviewed    Christina [name] 07/11/2020 10:10 AM

## 2020-07-16 ENCOUNTER — Ambulatory Visit: Payer: Medicare Other | Admitting: Internal Medicine

## 2020-07-17 ENCOUNTER — Encounter (HOSPITAL_COMMUNITY): Payer: Medicare Other

## 2020-07-17 ENCOUNTER — Other Ambulatory Visit: Payer: Self-pay

## 2020-07-17 ENCOUNTER — Encounter (HOSPITAL_COMMUNITY)
Admission: RE | Admit: 2020-07-17 | Discharge: 2020-07-17 | Disposition: A | Discharge status: Home / Self Care | Payer: Medicare Other | Source: Ambulatory Visit | Attending: Internal Medicine | Admitting: Internal Medicine

## 2020-07-17 DIAGNOSIS — Z955 Presence of coronary angioplasty implant and graft: Secondary | ICD-10-CM | POA: Insufficient documentation

## 2020-07-17 NOTE — Progress Notes (Signed)
Daily Session Note  Patient Details  Name: Christina [name] MRN: [id number] Date of Birth: [birth year] Referring Provider:   Flowsheet Row CARDIAC REHAB PHASE II ORIENTATION from 07/11/2020 in [place]  Referring Provider Christina Haskell, MD      Encounter Date: 07/17/2020  Check In:  Session Check In - 07/17/20 0913      Check-In   Supervising physician immediately available to respond to emergencies [facility] immediately available    Physician(s) Dr. Tawanna Solo    Location [facility]    Staff Present Barnet Pall, RN, Milus Glazier, MS, EP-C, CCRP;Carlette [place], RN, Deland Pretty, MS, ACSM CEP, Exercise Physiologist;Annedrea Rosezella [place], RN, MHA;Other   Esmeralda Links, EP   Virtual Visit No    Medication changes reported     No    Fall or balance concerns reported    No    Tobacco Cessation No Change    Current number of cigarettes/nicotine per day     0    Warm-up and Cool-down Performed on first and last piece of equipment    Resistance Training Performed No    VAD Patient? No    PAD/SET Patient? No      Pain Assessment   Currently in Pain? No/denies    Pain Score 0-No pain    Multiple Pain Sites No           Capillary Blood Glucose: No results found for this or any previous visit (from the past 24 hour(s)).   Exercise Prescription Changes - 07/17/20 1000      Response to Exercise   Blood Pressure (Admit) 100/60    Blood Pressure (Exercise) 130/80    Blood Pressure (Exit) 114/74    Heart Rate (Admit) 93 bpm    Heart Rate (Exercise) 101 bpm    Heart Rate (Exit) 75 bpm    Rating of Perceived Exertion (Exercise) 13    Symptoms None    Comments Pt's first day in the CRP2 program    Duration Progress to 30 minutes of  aerobic without signs/symptoms of physical distress    Intensity THRR unchanged      Progression   Progression Continue to progress workloads to maintain intensity without  signs/symptoms of physical distress.    Average METs 2.8      Resistance Training   Training Prescription No    Weight No weights on Wednesdays      Interval Training   Interval Training No      NuStep   Level 2    SPM 85    Minutes 15    METs 2.7      Track   Laps 16    Minutes 15    METs 2.86           Social History   Tobacco Use  Smoking Status Never Smoker  Smokeless Tobacco Never Used    Goals Met:  Exercise tolerated well No report of cardiac concerns or symptoms  Goals Unmet:  Not Applicable  Comments: Christina [name] started cardiac rehab today.  Pt tolerated light exercise without difficulty.Christina [name] did exhibit a little fatigue on the walking track as she was wearing a heavy mask and sweat clothes. Patient given a lighter mask and was encouraged to take rest breaks as needed. VSS, telemetry-Sinus Rhythm PAC's, asymptomatic.  Medication list reconciled. Pt denies barriers to medicaiton compliance.  PSYCHOSOCIAL ASSESSMENT:  PHQ-0. Pt exhibits positive coping skills, hopeful outlook with supportive family. No psychosocial  needs identified at this time, no psychosocial interventions necessary.    Pt enjoys yard work, gardening and going to [employer].   Pt oriented to exercise equipment and routine.    Understanding verbalized.Barnet Pall, RN,BSN 07/17/2020 10:39 AM   Dr. Fransico Him is Medical Director for Cardiac Rehab at [facility].

## 2020-07-17 NOTE — Progress Notes (Signed)
QUALITY OF LIFE SCORE REVIEW  Pt completed Quality of Life survey as a participant in Cardiac Rehab.  Scores 21.0 or below are considered low.  Pt score very low in the family area of her quality of life questionnaire Overall 21.70, Health and Function 22.10, socioeconomic 22.67, physiological and spiritual 23.0, family 15.70. Patient quality of life slightly altered by physical constraints which limits ability to perform as prior to recent cardiac illness. Analysia.  Offered emotional support and reassurance.  Will continue to monitor and intervene as necessary.  Christina [name] says that she is concerned with her son's health as he has had an illness for the past year. Christina [name] says that he is getting better now.Christina [name] also says that her ex husband has melanoma.Barnet Pall, RN,BSN 07/17/2020 12:27 PM

## 2020-07-19 ENCOUNTER — Encounter (HOSPITAL_COMMUNITY)
Admission: RE | Admit: 2020-07-19 | Discharge: 2020-07-19 | Disposition: A | Discharge status: Home / Self Care | Payer: Medicare Other | Source: Ambulatory Visit | Attending: Internal Medicine | Admitting: Internal Medicine

## 2020-07-19 ENCOUNTER — Other Ambulatory Visit: Payer: Self-pay

## 2020-07-19 DIAGNOSIS — Z955 Presence of coronary angioplasty implant and graft: Secondary | ICD-10-CM

## 2020-07-20 ENCOUNTER — Encounter: Payer: Self-pay | Admitting: Dermatology

## 2020-07-22 ENCOUNTER — Other Ambulatory Visit: Payer: Self-pay

## 2020-07-22 ENCOUNTER — Encounter (HOSPITAL_COMMUNITY)
Admission: RE | Admit: 2020-07-22 | Discharge: 2020-07-22 | Disposition: A | Discharge status: Home / Self Care | Payer: Medicare Other | Source: Ambulatory Visit | Attending: Internal Medicine | Admitting: Internal Medicine

## 2020-07-22 DIAGNOSIS — Z955 Presence of coronary angioplasty implant and graft: Secondary | ICD-10-CM | POA: Diagnosis not present

## 2020-07-24 ENCOUNTER — Other Ambulatory Visit: Payer: Self-pay

## 2020-07-24 ENCOUNTER — Other Ambulatory Visit (HOSPITAL_COMMUNITY): Payer: Medicare Other

## 2020-07-24 ENCOUNTER — Encounter (HOSPITAL_COMMUNITY)
Admission: RE | Admit: 2020-07-24 | Discharge: 2020-07-24 | Disposition: A | Discharge status: Home / Self Care | Payer: Medicare Other | Source: Ambulatory Visit | Attending: Internal Medicine | Admitting: Internal Medicine

## 2020-07-24 DIAGNOSIS — Z955 Presence of coronary angioplasty implant and graft: Secondary | ICD-10-CM

## 2020-07-24 NOTE — Progress Notes (Signed)
Christina [name] 86 y.o. female Nutrition Note  Diagnosis: DES RCA  Past Medical History:  Diagnosis Date  . Anemia, unspecified   . Cataract   . Chest pain   . Coronary atherosclerosis of native coronary artery   . Depressive disorder, not elsewhere classified   . Diabetes mellitus   . Disorder of bone and cartilage, unspecified   . Diverticulosis of colon (without mention of hemorrhage)   . External hemorrhoids without mention of complication   . Family history of malignant neoplasm of gastrointestinal tract   . H/O: hysterectomy   . History of back surgery 2013    spinal stenosis 7 31 [place]  . Hx of dislocation of shoulder    right  . Hyperlipidemia    134 12-2013 per pt. on meds  . Hypertension   . Insomnia, unspecified   . Lumbago   . Personal history of colonic polyps   . Squamous cell carcinoma of skin 12/20/2017   well diff-right upper forearm (txpbx)  . Unspecified adverse effect of other drug, medicinal and biological substance(995.29)   . Unspecified hypothyroidism   . Unspecified vitamin D deficiency   . Urinary frequency   . Urinary tract infection, site not specified      Medications reviewed.   Current Outpatient Medications:  .  aspirin 81 MG EC tablet, TAKE 1 TABLET (81 MG TOTAL) BY MOUTH DAILY. SWALLOW WHOLE., Disp: 90 tablet, Rfl: 3 .  aspirin EC 81 MG tablet, Take 1 tablet (81 mg total) by mouth daily. Swallow whole., Disp: 90 tablet, Rfl: 3 .  Cholecalciferol (VITAMIN D-3) 125 MCG (5000 UT) TABS, Take 5,000 Units by mouth daily., Disp: , Rfl:  .  clopidogrel (PLAVIX) 75 MG tablet, Take 1 tablet (75 mg total) by mouth daily with breakfast., Disp: 90 tablet, Rfl: 3 .  Cyanocobalamin (VITAMIN B 12 PO), Take 5,000 Units by mouth daily., Disp: , Rfl:  .  escitalopram (LEXAPRO) 10 MG tablet, Take 10 mg by mouth daily., Disp: , Rfl:  .  ezetimibe (ZETIA) 10 MG tablet, Take 10 mg by mouth daily., Disp: , Rfl:  .  fish oil-omega-3 fatty acids 1000 MG  capsule, Take 1 g by mouth daily as needed (if wat something with fat). (Patient not taking: Reported on 07/11/2020), Disp: , Rfl:  .  gabapentin (NEURONTIN) 600 MG tablet, Take 600 mg by mouth 3 (three) times daily., Disp: , Rfl:  .  irbesartan (AVAPRO) 300 MG tablet, Take 300 mg by mouth at bedtime., Disp: , Rfl:  .  levothyroxine (SYNTHROID, LEVOTHROID) 50 MCG tablet, TAKE 1 TABLET BY MOUTH EVERY DAY, Disp: 30 tablet, Rfl: 5 .  LORazepam (ATIVAN) 2 MG tablet, Take 2 mg by mouth at bedtime., Disp: , Rfl:  .  Menaquinone-7 (VITAMIN K2 PO), Take 1 tablet by mouth daily. MK2, Disp: , Rfl:  .  Multiple Vitamins-Minerals (MULTIVITAMIN WITH MINERALS) tablet, Take 1 tablet by mouth daily. Centrum Silver, Disp: , Rfl:  .  niacin 500 MG tablet, Take 500 mg by mouth daily. (Patient not taking: Reported on 07/11/2020), Disp: , Rfl:  .  nitroGLYCERIN (NITROSTAT) 0.4 MG SL tablet, Place 1 tablet (0.4 mg total) under the tongue every 5 (five) minutes as needed for chest pain., Disp: 25 tablet, Rfl: 11 .  rosuvastatin (CRESTOR) 10 MG tablet, Take 1 tablet (10 mg total) by mouth daily., Disp: 90 tablet, Rfl: 3   Ht Readings from Last 1 Encounters:  07/11/20 5' 1.75" (1.568 m)  Wt Readings from Last 3 Encounters:  07/11/20 127 lb 13.9 oz (58 kg)  06/04/20 130 lb (59 kg)  05/28/20 130 lb (59 kg)     There is no height or weight on file to calculate BMI.   Social History   Tobacco Use  Smoking Status Never Smoker  Smokeless Tobacco Never Used     Lab Results  Component Value Date   CHOL 174 07/01/2016   Lab Results  Component Value Date   HDL 46 07/01/2016   Lab Results  Component Value Date   LDLCALC 101 (H) 07/01/2016   Lab Results  Component Value Date   TRIG 133 07/01/2016     Lab Results  Component Value Date   HGBA1C 6.3 (H) 06/30/2016     CBG (last 3)  No results for input(s): GLUCAP in the last 72 hours.   Nutrition Note  Spoke with pt. Nutrition Plan and Nutrition  Survey goals reviewed with pt. Pt is following a Heart Healthy diet. She has intentionally lost 5 lbs in past 2 months. She wants to maintain weight now.  She has a good appetite. She has quit taking omega 3 and niacin per PharmD instructions.   Pt has Pre-diabetes. Last A1c indicates blood glucose well-controlled.   Diet recall: Breakfast: 1-2 egg omelette with veggies and cheese, 1 piece bacon, 1/2 banana or 1/2 grapefruit Lunch: nothing Snack: sometimes a cookie Dinner: ham, potato salad, sweet potato casserole, and green beans OR chicken, beets, salad, brown rice.  Snack: cocktail or ice cream sherbet Estimated fiber intake  Per discussion, pt does not use canned/convenience foods often. Pt does not add salt to food. Pt does not eat out frequently. She does read food labels. We reviewed sodium recommendations. We also reviewed ways to increase fiber and reduce saturated fats for cholesterol management.   Pt expressed understanding of the information reviewed.    Nutrition Diagnosis ? Food-and nutrition-related knowledge deficit related to lack of exposure to information as related to diagnosis of: ? CVD ? Pre-diabetes ? Inadequate fiber intake related to lack of previous education about dietary fiber as evidenced by estimated daily fiber intake of 13 g/day.   Nutrition Intervention ? Pt's individual nutrition plan reviewed with pt.   ? TLC diet, label reading  ? Continue client-centered nutrition education by RD, as part of interdisciplinary care.  Goal(s) ? Pt to build a healthy plate including vegetables, fruits, whole grains, and low-fat dairy products in a heart healthy meal plan. ? Pt to incorporate 28-30 g fiber per day  ? Pt to reduce saturated fat to <12 g per day  Plan:   Will provide client-centered nutrition education as part of interdisciplinary care  Monitor and evaluate progress toward nutrition goal with team.   Michaele Offer, MS, RDN, LDN

## 2020-07-26 ENCOUNTER — Encounter (HOSPITAL_COMMUNITY): Payer: Medicare Other

## 2020-07-29 ENCOUNTER — Encounter (HOSPITAL_COMMUNITY): Payer: Medicare Other

## 2020-07-31 ENCOUNTER — Encounter (HOSPITAL_COMMUNITY): Payer: Medicare Other

## 2020-08-02 ENCOUNTER — Encounter (HOSPITAL_COMMUNITY)
Admission: RE | Admit: 2020-08-02 | Discharge: 2020-08-02 | Disposition: A | Discharge status: Home / Self Care | Payer: Medicare Other | Source: Ambulatory Visit | Attending: Internal Medicine | Admitting: Internal Medicine

## 2020-08-02 ENCOUNTER — Other Ambulatory Visit: Payer: Self-pay

## 2020-08-02 DIAGNOSIS — Z955 Presence of coronary angioplasty implant and graft: Secondary | ICD-10-CM

## 2020-08-05 ENCOUNTER — Other Ambulatory Visit: Payer: Self-pay

## 2020-08-05 ENCOUNTER — Encounter (HOSPITAL_COMMUNITY)
Admission: RE | Admit: 2020-08-05 | Discharge: 2020-08-05 | Disposition: A | Discharge status: Home / Self Care | Payer: Medicare Other | Source: Ambulatory Visit | Attending: Internal Medicine | Admitting: Internal Medicine

## 2020-08-05 DIAGNOSIS — Z955 Presence of coronary angioplasty implant and graft: Secondary | ICD-10-CM

## 2020-08-06 NOTE — Progress Notes (Signed)
Cardiac Individual Treatment Plan  Patient Details  Name: Christina [name] MRN: [id number] Date of Birth: Feb 17, 1935 Referring Provider:   Flowsheet Row CARDIAC REHAB PHASE II ORIENTATION from 07/11/2020 in [facility]  Referring Provider Rudean Haskell, MD      Initial Encounter Date:  [place] PHASE II ORIENTATION from 07/11/2020 in [facility]  Date 07/11/20      Visit Diagnosis: 05/21/20 S/P DES RCA   Patient's Home Medications on Admission:  Current Outpatient Medications:  .  aspirin 81 MG EC tablet, TAKE 1 TABLET (81 MG TOTAL) BY MOUTH DAILY. SWALLOW WHOLE., Disp: 90 tablet, Rfl: 3 .  aspirin EC 81 MG tablet, Take 1 tablet (81 mg total) by mouth daily. Swallow whole., Disp: 90 tablet, Rfl: 3 .  Cholecalciferol (VITAMIN D-3) 125 MCG (5000 UT) TABS, Take 5,000 Units by mouth daily., Disp: , Rfl:  .  clopidogrel (PLAVIX) 75 MG tablet, Take 1 tablet (75 mg total) by mouth daily with breakfast., Disp: 90 tablet, Rfl: 3 .  Cyanocobalamin (VITAMIN B 12 PO), Take 5,000 Units by mouth daily., Disp: , Rfl:  .  escitalopram (LEXAPRO) 10 MG tablet, Take 10 mg by mouth daily., Disp: , Rfl:  .  ezetimibe (ZETIA) 10 MG tablet, Take 10 mg by mouth daily., Disp: , Rfl:  .  fish oil-omega-3 fatty acids 1000 MG capsule, Take 1 g by mouth daily as needed (if wat something with fat). (Patient not taking: Reported on 07/11/2020), Disp: , Rfl:  .  gabapentin (NEURONTIN) 600 MG tablet, Take 600 mg by mouth 3 (three) times daily., Disp: , Rfl:  .  irbesartan (AVAPRO) 300 MG tablet, Take 300 mg by mouth at bedtime., Disp: , Rfl:  .  levothyroxine (SYNTHROID, LEVOTHROID) 50 MCG tablet, TAKE 1 TABLET BY MOUTH EVERY DAY, Disp: 30 tablet, Rfl: 5 .  LORazepam (ATIVAN) 2 MG tablet, Take 2 mg by mouth at bedtime., Disp: , Rfl:  .  Menaquinone-7 (VITAMIN K2 PO), Take 1 tablet by mouth daily. MK2, Disp: , Rfl:  .  Multiple  Vitamins-Minerals (MULTIVITAMIN WITH MINERALS) tablet, Take 1 tablet by mouth daily. Centrum Silver, Disp: , Rfl:  .  niacin 500 MG tablet, Take 500 mg by mouth daily. (Patient not taking: Reported on 07/11/2020), Disp: , Rfl:  .  nitroGLYCERIN (NITROSTAT) 0.4 MG SL tablet, Place 1 tablet (0.4 mg total) under the tongue every 5 (five) minutes as needed for chest pain., Disp: 25 tablet, Rfl: 11 .  rosuvastatin (CRESTOR) 10 MG tablet, Take 1 tablet (10 mg total) by mouth daily., Disp: 90 tablet, Rfl: 3  Past Medical History: Past Medical History:  Diagnosis Date  . Anemia, unspecified   . Cataract   . Chest pain   . Coronary atherosclerosis of native coronary artery   . Depressive disorder, not elsewhere classified   . Diabetes mellitus   . Disorder of bone and cartilage, unspecified   . Diverticulosis of colon (without mention of hemorrhage)   . External hemorrhoids without mention of complication   . Family history of malignant neoplasm of gastrointestinal tract   . H/O: hysterectomy   . History of back surgery 2013    spinal stenosis 7 31 [place]  . Hx of dislocation of shoulder    right  . Hyperlipidemia    134 12-2013 per pt. on meds  . Hypertension   . Insomnia, unspecified   . Lumbago   . Personal  history of colonic polyps   . Squamous cell carcinoma of skin 12/20/2017   well diff-right upper forearm (txpbx)  . Unspecified adverse effect of other drug, medicinal and biological substance(995.29)   . Unspecified hypothyroidism   . Unspecified vitamin D deficiency   . Urinary frequency   . Urinary tract infection, site not specified     Tobacco Use: Social History   Tobacco Use  Smoking Status Never Smoker  Smokeless Tobacco Never Used    Labs: Recent Review Flowsheet Data    Labs for ITP Cardiac and Pulmonary Rehab Latest Ref Rng & Units 02/12/2012 05/11/2013 09/10/2015 06/30/2016 07/01/2016   Cholestrol 0 - 200 mg/dL 152 167 - - 174   LDLCALC 0 - 99 mg/dL 79 91 -  - 101(H)   LDLDIRECT mg/dL - - - - -   HDL >40 mg/dL 54.10 57.80 - - 46   Trlycerides <150 mg/dL 93.0 91.0 - - 133   Hemoglobin A1c 4.8 - 5.6 % 6.4 - - 6.3(H) -   TCO2 0 - 100 mmol/L - - 22 - -      Capillary Blood Glucose: Lab Results  Component Value Date   GLUCAP 142 (H) 05/20/2020   GLUCAP 89 07/01/2016     Exercise Target Goals: Exercise Program Goal: Individual exercise prescription set using results from initial 6 min walk test and THRR while considering  patient's activity barriers and safety.   Exercise Prescription Goal: Starting with aerobic activity 30 plus minutes a day, 3 days per week for initial exercise prescription. Provide home exercise prescription and guidelines that participant acknowledges understanding prior to discharge.  Activity Barriers & Risk Stratification:  Activity Barriers & Cardiac Risk Stratification - 07/11/20 1455      Activity Barriers & Cardiac Risk Stratification   Activity Barriers Joint Problems;Balance Concerns;Back Problems;Neck/Spine Problems    Cardiac Risk Stratification High           6 Minute Walk:  6 Minute Walk    Row Name 07/11/20 1359         6 Minute Walk   Phase Initial     Distance 1376 feet     Walk Time 6 minutes     # of Rest Breaks 0     MPH 2.6     METS 2.35     RPE 13     Perceived Dyspnea  1     VO2 Peak 8.24     Symptoms Yes (comment)     Comments SOB, RPD = 1     Resting HR 70 bpm     Resting BP 122/74     Resting Oxygen Saturation  96 %     Exercise Oxygen Saturation  during 6 min walk 96 %     Max Ex. HR 93 bpm     Max Ex. BP 140/80     2 Minute Post BP 128/72            Oxygen Initial Assessment:   Oxygen Re-Evaluation:   Oxygen Discharge (Final Oxygen Re-Evaluation):   Initial Exercise Prescription:  Initial Exercise Prescription - 07/11/20 1400      Date of Initial Exercise RX and Referring Provider   Date 07/11/20    Referring Provider Rudean Haskell, MD     Expected Discharge Date 09/06/20      NuStep   Level 2    SPM 75    Minutes 15    METs 2      Track  Laps 12    Minutes 15    METs 2.39      Prescription Details   Frequency (times per week) 3    Duration Progress to 30 minutes of continuous aerobic without signs/symptoms of physical distress      Intensity   THRR 40-80% of Max Heartrate 54-109    Ratings of Perceived Exertion 11-13    Perceived Dyspnea 0-4      Progression   Progression Continue progressive overload as per policy without signs/symptoms or physical distress.      Resistance Training   Training Prescription Yes    Weight 2 lbs    Reps 10-15           Perform Capillary Blood Glucose checks as needed.  Exercise Prescription Changes:   Exercise Prescription Changes    Row Name 07/17/20 1000 08/05/20 1000           Response to Exercise   Blood Pressure (Admit) 100/60 120/64      Blood Pressure (Exercise) 130/80 130/74      Blood Pressure (Exit) 114/74 120/70      Heart Rate (Admit) 93 bpm 84 bpm      Heart Rate (Exercise) 101 bpm 102 bpm      Heart Rate (Exit) 75 bpm 69 bpm      Rating of Perceived Exertion (Exercise) 13 13      Symptoms None None      Comments Pt's first day in the [facility] program Reviewed METs      Duration Progress to 30 minutes of  aerobic without signs/symptoms of physical distress Continue with 30 min of aerobic exercise without signs/symptoms of physical distress.      Intensity THRR unchanged THRR unchanged             Progression   Progression Continue to progress workloads to maintain intensity without signs/symptoms of physical distress. Continue to progress workloads to maintain intensity without signs/symptoms of physical distress.      Average METs 2.8 3.2             Resistance Training   Training Prescription No Yes      Weight No weights on Wednesdays 2 lbs      Reps -- 10-15      Time -- 10 Minutes             Interval Training   Interval Training No  No             NuStep   Level 2 2      SPM 85 95      Minutes 15 15      METs 2.7 3             Track   Laps 16 20      Minutes 15 15      METs 2.86 3.32             Exercise Comments:   Exercise Comments    Row Name 07/17/20 1007 08/05/20 1015         Exercise Comments Pt's first day in the [facility] program. Pt is off to a good start. Reviewed METs with patient. She is making good progress in the [facility] program.             Exercise Goals and Review:   Exercise Goals    Row Name 07/11/20 1456             Exercise Goals  Increase Physical Activity Yes       Intervention Provide advice, education, support and counseling about physical activity/exercise needs.;Develop an individualized exercise prescription for aerobic and resistive training based on initial evaluation findings, risk stratification, comorbidities and participant's personal goals.       Expected Outcomes Short Term: Attend rehab on a regular basis to increase amount of physical activity.;Long Term: Add in home exercise to make exercise part of routine and to increase amount of physical activity.;Long Term: Exercising regularly at least 3-5 days a week.       Increase Strength and Stamina Yes       Intervention Provide advice, education, support and counseling about physical activity/exercise needs.;Develop an individualized exercise prescription for aerobic and resistive training based on initial evaluation findings, risk stratification, comorbidities and participant's personal goals.       Expected Outcomes Short Term: Increase workloads from initial exercise prescription for resistance, speed, and METs.;Short Term: Perform resistance training exercises routinely during rehab and add in resistance training at home;Long Term: Improve cardiorespiratory fitness, muscular endurance and strength as measured by increased METs and functional capacity (6MWT)       Able to understand and use rate of perceived exertion  (RPE) scale Yes       Intervention Provide education and explanation on how to use RPE scale       Expected Outcomes Short Term: Able to use RPE daily in rehab to express subjective intensity level;Long Term:  Able to use RPE to guide intensity level when exercising independently       Knowledge and understanding of Target Heart Rate Range (THRR) Yes       Intervention Provide education and explanation of THRR including how the numbers were predicted and where they are located for reference       Expected Outcomes Short Term: Able to state/look up THRR;Short Term: Able to use daily as guideline for intensity in rehab;Long Term: Able to use THRR to govern intensity when exercising independently       Understanding of Exercise Prescription Yes       Intervention Provide education, explanation, and written materials on patient's individual exercise prescription       Expected Outcomes Short Term: Able to explain program exercise prescription;Long Term: Able to explain home exercise prescription to exercise independently              Exercise Goals Re-Evaluation :  Exercise Goals Re-Evaluation    Row Name 07/17/20 1005             Exercise Goal Re-Evaluation   Exercise Goals Review Increase Physical Activity;Increase Strength and Stamina;Able to understand and use rate of perceived exertion (RPE) scale;Knowledge and understanding of Target Heart Rate Range (THRR);Understanding of Exercise Prescription       Comments Pt's first day of exercise in the [facility] program. Pt understands the exercise Rx, THRR, and RPE scale.       Expected Outcomes WIll continue to monitor patient and progress as tolerated.               Discharge Exercise Prescription (Final Exercise Prescription Changes):  Exercise Prescription Changes - 08/05/20 1000      Response to Exercise   Blood Pressure (Admit) 120/64    Blood Pressure (Exercise) 130/74    Blood Pressure (Exit) 120/70    Heart Rate (Admit) 84 bpm     Heart Rate (Exercise) 102 bpm    Heart Rate (Exit) 69 bpm    Rating  of Perceived Exertion (Exercise) 13    Symptoms None    Comments Reviewed METs    Duration Continue with 30 min of aerobic exercise without signs/symptoms of physical distress.    Intensity THRR unchanged      Progression   Progression Continue to progress workloads to maintain intensity without signs/symptoms of physical distress.    Average METs 3.2      Resistance Training   Training Prescription Yes    Weight 2 lbs    Reps 10-15    Time 10 Minutes      Interval Training   Interval Training No      NuStep   Level 2    SPM 95    Minutes 15    METs 3      Track   Laps 20    Minutes 15    METs 3.32           Nutrition:  Target Goals: Understanding of nutrition guidelines, daily intake of sodium 1500mg , cholesterol 200mg , calories 30% from fat and 7% or less from saturated fats, daily to have 5 or more servings of fruits and vegetables.  Biometrics:  Pre Biometrics - 07/11/20 1315      Pre Biometrics   Waist Circumference 33 inches    Hip Circumference 39.5 inches    Waist to Hip Ratio 0.84 %    Triceps Skinfold 17 mm    % Body Fat 35 %    Grip Strength 24 kg    Flexibility 18 in    Single Leg Stand 3.12 seconds            Nutrition Therapy Plan and Nutrition Goals:  Nutrition Therapy & Goals - 07/24/20 0948      Nutrition Therapy   Diet TLC    Drug/Food Interactions Statins/Certain Fruits      Personal Nutrition Goals   Nutrition Goal Pt to build a healthy plate including vegetables, fruits, whole grains, and low-fat dairy products in a heart healthy meal plan.    Personal Goal #2 Pt to incorporate 28-30 g fiber per day    Personal Goal #3 Pt to reduce saturated fat to <12 g per day      Intervention Plan   Intervention Prescribe, educate and counsel regarding individualized specific dietary modifications aiming towards targeted core components such as weight, hypertension,  lipid management, diabetes, heart failure and other comorbidities.;Nutrition handout(s) given to patient.    Expected Outcomes Short Term Goal: A plan has been developed with personal nutrition goals set during dietitian appointment.;Long Term Goal: Adherence to prescribed nutrition plan.           Nutrition Assessments:  MEDIFICTS Score Key:  ?70 Need to make dietary changes   40-70 Heart Healthy Diet  ? 40 Therapeutic Level Cholesterol Diet  Flowsheet Row CARDIAC REHAB PHASE II EXERCISE from 07/24/2020 in [facility]  Picture Your Plate Total Score on Admission 62     Picture Your Plate Scores:  [id number] Unhealthy dietary pattern with much room for improvement.  41-50 Dietary pattern unlikely to meet recommendations for good health and room for improvement.  51-60 More healthful dietary pattern, with some room for improvement.   >60 Healthy dietary pattern, although there may be some specific behaviors that could be improved.    Nutrition Goals Re-Evaluation:  Nutrition Goals Re-Evaluation    [place] Name 07/24/20 0949 08/02/20 1224           Goals  Current Weight 127 lb (57.6 kg) 128 lb 1.4 oz (58.1 kg)      Nutrition Goal Pt to build a healthy plate including vegetables, fruits, whole grains, and low-fat dairy products in a heart healthy meal plan. Pt to build a healthy plate including vegetables, fruits, whole grains, and low-fat dairy products in a heart healthy meal plan.             Personal Goal #2 Re-Evaluation   Personal Goal #2 Pt to incorporate 28-30 g fiber per day Pt to incorporate 28-30 g fiber per day             Personal Goal #3 Re-Evaluation   Personal Goal #3 Pt to reduce saturated fat to <12 g per day Pt to reduce saturated fat to <12 g per day             Nutrition Goals Discharge (Final Nutrition Goals Re-Evaluation):  Nutrition Goals Re-Evaluation - 08/02/20 1224      Goals   Current Weight 128 lb 1.4 oz (58.1  kg)    Nutrition Goal Pt to build a healthy plate including vegetables, fruits, whole grains, and low-fat dairy products in a heart healthy meal plan.      Personal Goal #2 Re-Evaluation   Personal Goal #2 Pt to incorporate 28-30 g fiber per day      Personal Goal #3 Re-Evaluation   Personal Goal #3 Pt to reduce saturated fat to <12 g per day           Psychosocial: Target Goals: Acknowledge presence or absence of significant depression and/or stress, maximize coping skills, provide positive support system. Participant is able to verbalize types and ability to use techniques and skills needed for reducing stress and depression.  Initial Review & Psychosocial Screening:  Initial Psych Review & Screening - 07/11/20 1348      Initial Review   Current issues with History of Depression      Family Dynamics   Good Support System? Yes   Leland lives alone. Simcha has her son for support who lives nearby if needed     Barriers   Psychosocial barriers to participate in program The patient should benefit from training in stress management and relaxation.;There are no identifiable barriers or psychosocial needs.      Screening Interventions   Interventions Encouraged to exercise;Provide feedback about the scores to participant    Expected Outcomes Long Term Goal: Stressors or current issues are controlled or eliminated.;Short Term goal: Identification and review with participant of any Quality of Life or Depression concerns found by scoring the questionnaire.;Long Term goal: The participant improves quality of Life and PHQ9 Scores as seen by post scores and/or verbalization of changes           Quality of Life Scores:  Quality of Life - 07/11/20 1441      Quality of Life   Select Quality of Life      Quality of Life Scores   Health/Function Pre 22.1 %    Socioeconomic Pre 22.67 %    Psych/Spiritual Pre 23 %    Family Pre 15.7 %    GLOBAL Pre 21.7 %          Scores of 19 and  below usually indicate a poorer quality of life in these areas.  A difference of  2-3 points is a clinically meaningful difference.  A difference of 2-3 points in the total score of the Quality of Life Index has been  associated with significant improvement in overall quality of life, self-image, physical symptoms, and general health in studies assessing change in quality of life.  PHQ-9: Recent Review Flowsheet Data    Depression screen [facility] 2/9 07/11/2020   Decreased Interest 0   Down, Depressed, Hopeless 0   PHQ - 2 Score 0     Interpretation of Total Score  Total Score Depression Severity:  1-4 = Minimal depression, 5-9 = Mild depression, 10-14 = Moderate depression, 15-19 = Moderately severe depression, 20-27 = Severe depression   Psychosocial Evaluation and Intervention:   Psychosocial Re-Evaluation:  Psychosocial Re-Evaluation    [place] Name 07/17/20 0959 08/06/20 1359           Psychosocial Re-Evaluation   Current issues with History of Depression History of Depression      Comments Reviewed quality of life questionnaire with patient. Concerned about son and ex husband's health. Son is doing better Zarria has not voiced any increased or stressors or concerns. Will continue to monitor and offer support as needed      Expected Outcomes Shaunta will have decreased stressors upon completion of phase 2 cardiac rehab Falyn will have decreased stressors upon completion of phase 2 cardiac rehab      Interventions Stress management education;Encouraged to attend Cardiac Rehabilitation for the exercise Stress management education;Encouraged to attend Cardiac Rehabilitation for the exercise      Continue Psychosocial Services  Follow up required by staff Follow up required by staff             Psychosocial Discharge (Final Psychosocial Re-Evaluation):  Psychosocial Re-Evaluation - 08/06/20 1359      Psychosocial Re-Evaluation   Current issues with History of Depression    Comments  Aiesha has not voiced any increased or stressors or concerns. Will continue to monitor and offer support as needed    Expected Outcomes Rochanda will have decreased stressors upon completion of phase 2 cardiac rehab    Interventions Stress management education;Encouraged to attend Cardiac Rehabilitation for the exercise    Continue Psychosocial Services  Follow up required by staff           Vocational Rehabilitation: Provide vocational rehab assistance to qualifying candidates.   Vocational Rehab Evaluation & Intervention:  Vocational Rehab - 07/11/20 1349      Initial Vocational Rehab Evaluation & Intervention   Assessment shows need for Vocational Rehabilitation No   Jaquaya is retired and does not need vocatinal rehab at this time          Education: Education Goals: Education classes will be provided on a weekly basis, covering required topics. Participant will state understanding/return demonstration of topics presented.  Learning Barriers/Preferences:  Learning Barriers/Preferences - 07/11/20 1441      Learning Barriers/Preferences   Learning Barriers Hearing;Sight   Wears glasses and hearing aids   Learning Preferences None           Education Topics: Hypertension, Hypertension Reduction -Define heart disease and high blood pressure. Discus how high blood pressure affects the body and ways to reduce high blood pressure.   Exercise and Your Heart -Discuss why it is important to exercise, the FITT principles of exercise, normal and abnormal responses to exercise, and how to exercise safely.   Angina -Discuss definition of angina, causes of angina, treatment of angina, and how to decrease risk of having angina.   Cardiac Medications -Review what the following cardiac medications are used for, how they affect the body, and side effects that may  occur when taking the medications.  Medications include Aspirin, Beta blockers, calcium channel blockers, ACE Inhibitors,  angiotensin receptor blockers, diuretics, digoxin, and antihyperlipidemics.   Congestive Heart Failure -Discuss the definition of CHF, how to live with CHF, the signs and symptoms of CHF, and how keep track of weight and sodium intake.   Heart Disease and Intimacy -Discus the effect sexual activity has on the heart, how changes occur during intimacy as we age, and safety during sexual activity.   Smoking Cessation / COPD -Discuss different methods to quit smoking, the health benefits of quitting smoking, and the definition of COPD.   Nutrition I: Fats -Discuss the types of cholesterol, what cholesterol does to the heart, and how cholesterol levels can be controlled.   Nutrition II: Labels -Discuss the different components of food labels and how to read food label   Heart Parts/Heart Disease and PAD -Discuss the anatomy of the heart, the pathway of blood circulation through the heart, and these are affected by heart disease.   Stress I: Signs and Symptoms -Discuss the causes of stress, how stress may lead to anxiety and depression, and ways to limit stress.   Stress II: Relaxation -Discuss different types of relaxation techniques to limit stress.   Warning Signs of Stroke / TIA -Discuss definition of a stroke, what the signs and symptoms are of a stroke, and how to identify when someone is having stroke.   Knowledge Questionnaire Score:  Knowledge Questionnaire Score - 07/11/20 1440      Knowledge Questionnaire Score   Pre Score 18/24           Core Components/Risk Factors/Patient Goals at Admission:  Personal Goals and Risk Factors at Admission - 07/11/20 1445      Core Components/Risk Factors/Patient Goals on Admission    Weight Management Weight Maintenance    Intervention Weight Management: Provide education and appropriate resources to help participant work on and attain dietary goals.;Weight Management: Develop a combined nutrition and exercise program  designed to reach desired caloric intake, while maintaining appropriate intake of nutrient and fiber, sodium and fats, and appropriate energy expenditure required for the weight goal.    Admit Weight 127 lb 13.9 oz (58 kg)    Expected Outcomes Weight Maintenance: Understanding of the daily nutrition guidelines, which includes 25-35% calories from fat, 7% or less cal from saturated fats, less than 200mg  cholesterol, less than 1.5gm of sodium, & 5 or more servings of fruits and vegetables daily;Long Term: Adherence to nutrition and physical activity/exercise program aimed toward attainment of established weight goal;Short Term: Continue to assess and modify interventions until short term weight is achieved;Understanding recommendations for meals to include 15-35% energy as protein, 25-35% energy from fat, 35-60% energy from carbohydrates, less than 200mg  of dietary cholesterol, 20-35 gm of total fiber daily;Understanding of distribution of calorie intake throughout the day with the consumption of 4-5 meals/snacks    Hypertension Yes    Intervention Monitor prescription use compliance.;Provide education on lifestyle modifcations including regular physical activity/exercise, weight management, moderate sodium restriction and increased consumption of fresh fruit, vegetables, and low fat dairy, alcohol moderation, and smoking cessation.    Expected Outcomes Short Term: Continued assessment and intervention until BP is < 140/93mm HG in hypertensive participants. < 130/54mm HG in hypertensive participants with diabetes, heart failure or chronic kidney disease.;Long Term: Maintenance of blood pressure at goal levels.    Lipids Yes    Intervention Provide education and support for participant on nutrition & aerobic/resistive exercise  along with prescribed medications to achieve LDL 70mg , HDL >40mg .    Expected Outcomes Short Term: Participant states understanding of desired cholesterol values and is compliant with  medications prescribed. Participant is following exercise prescription and nutrition guidelines.;Long Term: Cholesterol controlled with medications as prescribed, with individualized exercise RX and with personalized nutrition plan. Value goals: LDL < 70mg , HDL > 40 mg.           Core Components/Risk Factors/Patient Goals Review:   Goals and Risk Factor Review    Row Name 07/17/20 1005 08/06/20 1402           Core Components/Risk Factors/Patient Goals Review   Personal Goals Review Weight Management/Obesity;Hypertension;Lipids Weight Management/Obesity;Hypertension;Lipids      Review Chellsie started exercise on 07/17/20 and did well with exercise. Gregoria did have to stopp on the track as she had some fatigue Legend is doing well with exercise. Ludia's vital signs have been stable      Expected Outcomes Ilani will continue to participate in phase 2 cardiac rehab for exercise, nutrition and stress manegment [place] will continue to participate in phase 2 cardiac rehab for exercise, nutrition and stress manegment             Core Components/Risk Factors/Patient Goals at Discharge (Final Review):   Goals and Risk Factor Review - 08/06/20 1402      Core Components/Risk Factors/Patient Goals Review   Personal Goals Review Weight Management/Obesity;Hypertension;Lipids    Review Jeriah is doing well with exercise. Kayse's vital signs have been stable    Expected Outcomes Norelle will continue to participate in phase 2 cardiac rehab for exercise, nutrition and stress manegment           ITP Comments:  ITP Comments    Row Name 07/11/20 1347 07/17/20 0958 08/06/20 1357       ITP Comments Dr Fransico Him MD, Medical Director 30 Day ITP Review. Treonna started exercise on 07/17/20 and did well with exercise 30 Day ITP Review. Tyerra has good attendance and participation in phase 2 cardiac rehab.            Comments: See ITP comments.Barnet Pall, RN,BSN 08/06/2020 2:04 PM

## 2020-08-07 ENCOUNTER — Encounter (HOSPITAL_COMMUNITY)
Admission: RE | Admit: 2020-08-07 | Discharge: 2020-08-07 | Disposition: A | Discharge status: Home / Self Care | Payer: Medicare Other | Source: Ambulatory Visit | Attending: Internal Medicine | Admitting: Internal Medicine

## 2020-08-07 ENCOUNTER — Other Ambulatory Visit: Payer: Self-pay

## 2020-08-07 DIAGNOSIS — Z955 Presence of coronary angioplasty implant and graft: Secondary | ICD-10-CM | POA: Diagnosis not present

## 2020-08-09 ENCOUNTER — Encounter (HOSPITAL_COMMUNITY): Payer: Medicare Other

## 2020-08-12 ENCOUNTER — Encounter (HOSPITAL_COMMUNITY): Payer: Medicare Other

## 2020-08-14 ENCOUNTER — Other Ambulatory Visit: Payer: Self-pay

## 2020-08-14 ENCOUNTER — Encounter (HOSPITAL_COMMUNITY)
Admission: RE | Admit: 2020-08-14 | Discharge: 2020-08-14 | Disposition: A | Discharge status: Home / Self Care | Payer: Medicare Other | Source: Ambulatory Visit | Attending: Internal Medicine | Admitting: Internal Medicine

## 2020-08-14 DIAGNOSIS — Z955 Presence of coronary angioplasty implant and graft: Secondary | ICD-10-CM | POA: Insufficient documentation

## 2020-08-16 ENCOUNTER — Encounter (HOSPITAL_COMMUNITY)
Admission: RE | Admit: 2020-08-16 | Discharge: 2020-08-16 | Disposition: A | Discharge status: Home / Self Care | Payer: Medicare Other | Source: Ambulatory Visit | Attending: Internal Medicine | Admitting: Internal Medicine

## 2020-08-16 ENCOUNTER — Other Ambulatory Visit: Payer: Self-pay

## 2020-08-16 DIAGNOSIS — Z955 Presence of coronary angioplasty implant and graft: Secondary | ICD-10-CM

## 2020-08-19 ENCOUNTER — Other Ambulatory Visit: Payer: Self-pay

## 2020-08-19 ENCOUNTER — Other Ambulatory Visit (HOSPITAL_COMMUNITY): Payer: Self-pay

## 2020-08-19 ENCOUNTER — Encounter (HOSPITAL_COMMUNITY)
Admission: RE | Admit: 2020-08-19 | Discharge: 2020-08-19 | Disposition: A | Discharge status: Home / Self Care | Payer: Medicare Other | Source: Ambulatory Visit | Attending: Internal Medicine | Admitting: Internal Medicine

## 2020-08-19 DIAGNOSIS — Z955 Presence of coronary angioplasty implant and graft: Secondary | ICD-10-CM

## 2020-08-21 ENCOUNTER — Encounter (HOSPITAL_COMMUNITY)
Admission: RE | Admit: 2020-08-21 | Discharge: 2020-08-21 | Disposition: A | Discharge status: Home / Self Care | Payer: Medicare Other | Source: Ambulatory Visit | Attending: Internal Medicine | Admitting: Internal Medicine

## 2020-08-21 ENCOUNTER — Other Ambulatory Visit: Payer: Self-pay

## 2020-08-21 DIAGNOSIS — Z955 Presence of coronary angioplasty implant and graft: Secondary | ICD-10-CM | POA: Diagnosis not present

## 2020-08-23 ENCOUNTER — Other Ambulatory Visit: Payer: Self-pay

## 2020-08-23 ENCOUNTER — Encounter (HOSPITAL_COMMUNITY)
Admission: RE | Admit: 2020-08-23 | Discharge: 2020-08-23 | Disposition: A | Discharge status: Home / Self Care | Payer: Medicare Other | Source: Ambulatory Visit | Attending: Internal Medicine | Admitting: Internal Medicine

## 2020-08-23 DIAGNOSIS — Z955 Presence of coronary angioplasty implant and graft: Secondary | ICD-10-CM

## 2020-08-26 ENCOUNTER — Other Ambulatory Visit: Payer: Self-pay

## 2020-08-26 ENCOUNTER — Encounter (HOSPITAL_COMMUNITY)
Admission: RE | Admit: 2020-08-26 | Discharge: 2020-08-26 | Disposition: A | Discharge status: Home / Self Care | Payer: Medicare Other | Source: Ambulatory Visit | Attending: Internal Medicine | Admitting: Internal Medicine

## 2020-08-26 DIAGNOSIS — Z955 Presence of coronary angioplasty implant and graft: Secondary | ICD-10-CM

## 2020-08-27 NOTE — Progress Notes (Signed)
Reviewed home exercise Rx with patient today. Encouraged patient to walk 2-3 x/week at home in addition to her CRP2 program. Discussed walking 10-30 minutes and patient can break the walk up into 10-15 sessions. Encouraged warm-up, cool-down, and stretching. Reviewed THRR of 54-109 and keeping RPE between 11-13. Encouraged hydration. Discussed weather parameters for temperature and humidity for safe exercise outdoors. Reviewed S/S to terminate exercise and when to call 911 vs MD. Reviewed NTG use through teach back method. Stressed to pt to carry phone when walking outdoors. Pt verbalizes understanding of the home exercise Rx and was provided a copy.   Lesly Rubenstein MS, ACSM-CEP, CCRP

## 2020-08-28 ENCOUNTER — Other Ambulatory Visit: Payer: Self-pay

## 2020-08-28 ENCOUNTER — Telehealth (HOSPITAL_COMMUNITY): Payer: Self-pay

## 2020-08-28 ENCOUNTER — Encounter (HOSPITAL_COMMUNITY)
Admission: RE | Admit: 2020-08-28 | Discharge: 2020-08-28 | Disposition: A | Discharge status: Home / Self Care | Payer: Medicare Other | Source: Ambulatory Visit | Attending: Internal Medicine | Admitting: Internal Medicine

## 2020-08-28 DIAGNOSIS — Z955 Presence of coronary angioplasty implant and graft: Secondary | ICD-10-CM | POA: Diagnosis not present

## 2020-08-28 NOTE — Telephone Encounter (Signed)
[facility] cardiac rehab EP, asked if pt could extend her cardiac rehab sessions, extended pt cardiac rehab sessions for 2 weeks, added appts in pt's chart.

## 2020-08-30 ENCOUNTER — Encounter (HOSPITAL_COMMUNITY)
Admission: RE | Admit: 2020-08-30 | Discharge: 2020-08-30 | Disposition: A | Discharge status: Home / Self Care | Payer: Medicare Other | Source: Ambulatory Visit | Attending: Internal Medicine | Admitting: Internal Medicine

## 2020-08-30 ENCOUNTER — Other Ambulatory Visit: Payer: Self-pay

## 2020-08-30 DIAGNOSIS — Z955 Presence of coronary angioplasty implant and graft: Secondary | ICD-10-CM

## 2020-09-02 ENCOUNTER — Other Ambulatory Visit: Payer: Self-pay

## 2020-09-02 ENCOUNTER — Other Ambulatory Visit: Payer: Medicare Other | Admitting: *Deleted

## 2020-09-02 ENCOUNTER — Encounter (HOSPITAL_COMMUNITY)
Admission: RE | Admit: 2020-09-02 | Discharge: 2020-09-02 | Disposition: A | Discharge status: Home / Self Care | Payer: Medicare Other | Source: Ambulatory Visit | Attending: Internal Medicine | Admitting: Internal Medicine

## 2020-09-02 DIAGNOSIS — Z955 Presence of coronary angioplasty implant and graft: Secondary | ICD-10-CM | POA: Diagnosis not present

## 2020-09-02 DIAGNOSIS — E782 Mixed hyperlipidemia: Secondary | ICD-10-CM

## 2020-09-02 LAB — LIPID PANEL
Chol/HDL Ratio: 2 ratio (ref 0.0–4.4)
Cholesterol, Total: 125 mg/dL (ref 100–199)
HDL: 61 mg/dL (ref >39)
LDL Chol Calc (NIH): 49 mg/dL (ref 0–99)
Triglycerides: 73 mg/dL (ref 0–149)
VLDL Cholesterol Cal: 15 mg/dL (ref 5–40)

## 2020-09-02 LAB — HEPATIC FUNCTION PANEL
ALT: 14 IU/L (ref 0–32)
AST: 20 IU/L (ref 0–40)
Albumin: 4.5 g/dL (ref 3.6–4.6)
Alkaline Phosphatase: 82 IU/L (ref 44–121)
Bilirubin Total: 0.8 mg/dL (ref 0.0–1.2)
Bilirubin, Direct: 0.22 mg/dL (ref 0.00–0.40)
Total Protein: 6.8 g/dL (ref 6.0–8.5)

## 2020-09-03 NOTE — Progress Notes (Signed)
Cardiac Individual Treatment Plan  Patient Details  Name: Christina [name] MRN: [id number] Date of Birth: Feb 17, 1935 Referring Provider:   Flowsheet Row CARDIAC REHAB PHASE II ORIENTATION from 07/11/2020 in [facility]  Referring Provider Rudean Haskell, MD      Initial Encounter Date:  [place] PHASE II ORIENTATION from 07/11/2020 in [place]  Date 07/11/20      Visit Diagnosis: 05/21/20 S/P DES RCA   Patient's Home Medications on Admission:  Current Outpatient Medications:  .  aspirin 81 MG EC tablet, TAKE 1 TABLET (81 MG TOTAL) BY MOUTH DAILY. SWALLOW WHOLE., Disp: 90 tablet, Rfl: 3 .  aspirin EC 81 MG tablet, Take 1 tablet (81 mg total) by mouth daily. Swallow whole., Disp: 90 tablet, Rfl: 3 .  Cholecalciferol (VITAMIN D-3) 125 MCG (5000 UT) TABS, Take 5,000 Units by mouth daily., Disp: , Rfl:  .  clopidogrel (PLAVIX) 75 MG tablet, Take 1 tablet (75 mg total) by mouth daily with breakfast., Disp: 90 tablet, Rfl: 3 .  Cyanocobalamin (VITAMIN B 12 PO), Take 5,000 Units by mouth daily., Disp: , Rfl:  .  escitalopram (LEXAPRO) 10 MG tablet, Take 10 mg by mouth daily., Disp: , Rfl:  .  ezetimibe (ZETIA) 10 MG tablet, Take 10 mg by mouth daily., Disp: , Rfl:  .  fish oil-omega-3 fatty acids 1000 MG capsule, Take 1 g by mouth daily as needed (if wat something with fat). (Patient not taking: Reported on 07/11/2020), Disp: , Rfl:  .  gabapentin (NEURONTIN) 600 MG tablet, Take 600 mg by mouth 3 (three) times daily., Disp: , Rfl:  .  irbesartan (AVAPRO) 300 MG tablet, Take 300 mg by mouth at bedtime., Disp: , Rfl:  .  levothyroxine (SYNTHROID, LEVOTHROID) 50 MCG tablet, TAKE 1 TABLET BY MOUTH EVERY DAY, Disp: 30 tablet, Rfl: 5 .  LORazepam (ATIVAN) 2 MG tablet, Take 2 mg by mouth at bedtime., Disp: , Rfl:  .  Menaquinone-7 (VITAMIN K2 PO), Take 1 tablet by mouth daily. MK2, Disp: , Rfl:  .  Multiple  Vitamins-Minerals (MULTIVITAMIN WITH MINERALS) tablet, Take 1 tablet by mouth daily. Centrum Silver, Disp: , Rfl:  .  niacin 500 MG tablet, Take 500 mg by mouth daily. (Patient not taking: Reported on 07/11/2020), Disp: , Rfl:  .  nitroGLYCERIN (NITROSTAT) 0.4 MG SL tablet, Place 1 tablet (0.4 mg total) under the tongue every 5 (five) minutes as needed for chest pain., Disp: 25 tablet, Rfl: 11 .  rosuvastatin (CRESTOR) 10 MG tablet, Take 1 tablet (10 mg total) by mouth daily., Disp: 90 tablet, Rfl: 3  Past Medical History: Past Medical History:  Diagnosis Date  . Anemia, unspecified   . Cataract   . Chest pain   . Coronary atherosclerosis of native coronary artery   . Depressive disorder, not elsewhere classified   . Diabetes mellitus   . Disorder of bone and cartilage, unspecified   . Diverticulosis of colon (without mention of hemorrhage)   . External hemorrhoids without mention of complication   . Family history of malignant neoplasm of gastrointestinal tract   . H/O: hysterectomy   . History of back surgery 2013    spinal stenosis 7 31 [place]  . Hx of dislocation of shoulder    right  . Hyperlipidemia    134 12-2013 per pt. on meds  . Hypertension   . Insomnia, unspecified   . Lumbago   . Personal  history of colonic polyps   . Squamous cell carcinoma of skin 12/20/2017   well diff-right upper forearm (txpbx)  . Unspecified adverse effect of other drug, medicinal and biological substance(995.29)   . Unspecified hypothyroidism   . Unspecified vitamin D deficiency   . Urinary frequency   . Urinary tract infection, site not specified     Tobacco Use: Social History   Tobacco Use  Smoking Status Never Smoker  Smokeless Tobacco Never Used    Labs: Recent Review Flowsheet Data    Labs for ITP Cardiac and Pulmonary Rehab Latest Ref Rng & Units 05/11/2013 09/10/2015 06/30/2016 07/01/2016 09/02/2020   Cholestrol 100 - 199 mg/dL 167 - - 174 125   LDLCALC 0 - 99 mg/dL 91 - -  101(H) 49   LDLDIRECT mg/dL - - - - -   HDL >39 mg/dL 57.80 - - 46 61   Trlycerides 0 - 149 mg/dL 91.0 - - 133 73   Hemoglobin A1c 4.8 - 5.6 % - - 6.3(H) - -   TCO2 0 - 100 mmol/L - 22 - - -      Capillary Blood Glucose: Lab Results  Component Value Date   GLUCAP 142 (H) 05/20/2020   GLUCAP 89 07/01/2016     Exercise Target Goals: Exercise Program Goal: Individual exercise prescription set using results from initial 6 min walk test and THRR while considering  patient's activity barriers and safety.   Exercise Prescription Goal: Starting with aerobic activity 30 plus minutes a day, 3 days per week for initial exercise prescription. Provide home exercise prescription and guidelines that participant acknowledges understanding prior to discharge.  Activity Barriers & Risk Stratification:  Activity Barriers & Cardiac Risk Stratification - 07/11/20 1455      Activity Barriers & Cardiac Risk Stratification   Activity Barriers Joint Problems;Balance Concerns;Back Problems;Neck/Spine Problems    Cardiac Risk Stratification High           6 Minute Walk:  6 Minute Walk    Row Name 07/11/20 1359         6 Minute Walk   Phase Initial     Distance 1376 feet     Walk Time 6 minutes     # of Rest Breaks 0     MPH 2.6     METS 2.35     RPE 13     Perceived Dyspnea  1     VO2 Peak 8.24     Symptoms Yes (comment)     Comments SOB, RPD = 1     Resting HR 70 bpm     Resting BP 122/74     Resting Oxygen Saturation  96 %     Exercise Oxygen Saturation  during 6 min walk 96 %     Max Ex. HR 93 bpm     Max Ex. BP 140/80     2 Minute Post BP 128/72            Oxygen Initial Assessment:   Oxygen Re-Evaluation:   Oxygen Discharge (Final Oxygen Re-Evaluation):   Initial Exercise Prescription:  Initial Exercise Prescription - 07/11/20 1400      Date of Initial Exercise RX and Referring Provider   Date 07/11/20    Referring Provider Rudean Haskell, MD     Expected Discharge Date 09/06/20      NuStep   Level 2    SPM 75    Minutes 15    METs 2  Track   Laps 12    Minutes 15    METs 2.39      Prescription Details   Frequency (times per week) 3    Duration Progress to 30 minutes of continuous aerobic without signs/symptoms of physical distress      Intensity   THRR 40-80% of Max Heartrate 54-109    Ratings of Perceived Exertion 11-13    Perceived Dyspnea 0-4      Progression   Progression Continue progressive overload as per policy without signs/symptoms or physical distress.      Resistance Training   Training Prescription Yes    Weight 2 lbs    Reps 10-15           Perform Capillary Blood Glucose checks as needed.  Exercise Prescription Changes:  Exercise Prescription Changes    Row Name 07/17/20 1000 08/05/20 1000 08/23/20 1228         Response to Exercise   Blood Pressure (Admit) 100/60 120/64 104/60     Blood Pressure (Exercise) 130/80 130/74 114/72     Blood Pressure (Exit) 114/74 120/70 102/60     Heart Rate (Admit) 93 bpm 84 bpm 65 bpm     Heart Rate (Exercise) 101 bpm 102 bpm 91 bpm     Heart Rate (Exit) 75 bpm 69 bpm 66 bpm     Rating of Perceived Exertion (Exercise) 13 13 12      Symptoms None None None     Comments Pt's first day in the CRP2 program Reviewed METs Reviewed METs, Home exercise Rx, goals     Duration Progress to 30 minutes of  aerobic without signs/symptoms of physical distress Continue with 30 min of aerobic exercise without signs/symptoms of physical distress. Continue with 30 min of aerobic exercise without signs/symptoms of physical distress.     Intensity THRR unchanged THRR unchanged THRR unchanged           Progression   Progression Continue to progress workloads to maintain intensity without signs/symptoms of physical distress. Continue to progress workloads to maintain intensity without signs/symptoms of physical distress. Continue to progress workloads to maintain intensity  without signs/symptoms of physical distress.     Average METs 2.8 3.2 2.9           Resistance Training   Training Prescription No Yes Yes     Weight No weights on Wednesdays 2 lbs 2 lbs     Reps -- 10-15 10-15     Time -- 10 Minutes 10 Minutes           Interval Training   Interval Training No No No           NuStep   Level 2 2 3      SPM 85 95 90     Minutes 15 15 15      METs 2.7 3 2.5           Track   Laps 16 20 20      Minutes 15 15 15      METs 2.86 3.32 3.32           Home Exercise Plan   Plans to continue exercise at -- -- Home (comment)     Frequency -- -- Add 2 additional days to program exercise sessions.     Initial Home Exercises Provided -- -- 08/23/20            Exercise Comments:  Exercise Comments    Row Name 07/17/20 1007 08/05/20 1015  08/23/20 1030       Exercise Comments Pt's first day in the CRP2 program. Pt is off to a good start. Reviewed METs with patient. She is making good progress in the CRP2 program. Reviewed METs, Goals and home exercise Rx. Pt verbalized understnading of the home exercise Rx and was provided a copy.            Exercise Goals and Review:  Exercise Goals    Row Name 07/11/20 1456             Exercise Goals   Increase Physical Activity Yes       Intervention Provide advice, education, support and counseling about physical activity/exercise needs.;Develop an individualized exercise prescription for aerobic and resistive training based on initial evaluation findings, risk stratification, comorbidities and participant's personal goals.       Expected Outcomes Short Term: Attend rehab on a regular basis to increase amount of physical activity.;Long Term: Add in home exercise to make exercise part of routine and to increase amount of physical activity.;Long Term: Exercising regularly at least 3-5 days a week.       Increase Strength and Stamina Yes       Intervention Provide advice, education, support and counseling  about physical activity/exercise needs.;Develop an individualized exercise prescription for aerobic and resistive training based on initial evaluation findings, risk stratification, comorbidities and participant's personal goals.       Expected Outcomes Short Term: Increase workloads from initial exercise prescription for resistance, speed, and METs.;Short Term: Perform resistance training exercises routinely during rehab and add in resistance training at home;Long Term: Improve cardiorespiratory fitness, muscular endurance and strength as measured by increased METs and functional capacity (6MWT)       Able to understand and use rate of perceived exertion (RPE) scale Yes       Intervention Provide education and explanation on how to use RPE scale       Expected Outcomes Short Term: Able to use RPE daily in rehab to express subjective intensity level;Long Term:  Able to use RPE to guide intensity level when exercising independently       Knowledge and understanding of Target Heart Rate Range (THRR) Yes       Intervention Provide education and explanation of THRR including how the numbers were predicted and where they are located for reference       Expected Outcomes Short Term: Able to state/look up THRR;Short Term: Able to use daily as guideline for intensity in rehab;Long Term: Able to use THRR to govern intensity when exercising independently       Understanding of Exercise Prescription Yes       Intervention Provide education, explanation, and written materials on patient's individual exercise prescription       Expected Outcomes Short Term: Able to explain program exercise prescription;Long Term: Able to explain home exercise prescription to exercise independently              Exercise Goals Re-Evaluation :  Exercise Goals Re-Evaluation    [place] Name 07/17/20 1005 08/23/20 1015           Exercise Goal Re-Evaluation   Exercise Goals Review Increase Physical Activity;Increase Strength and  Stamina;Able to understand and use rate of perceived exertion (RPE) scale;Knowledge and understanding of Target Heart Rate Range (THRR);Understanding of Exercise Prescription Increase Physical Activity;Increase Strength and Stamina;Able to understand and use rate of perceived exertion (RPE) scale;Knowledge and understanding of Target Heart Rate Range (THRR);Able to check pulse independently;Understanding  of Exercise Prescription      Comments Pt's first day of exercise in the CRP2 program. Pt understands the exercise Rx, THRR, and RPE scale. Reviewed METs, Goals and home exercise Rx with patient. Pt not walking at home but is active doing yard work and cleaning her home. Encouraged patient to incorporate purposeful walking into her activities. 10-30 minutes 2-3x/week      Expected Outcomes WIll continue to monitor patient and progress as tolerated. Pt will walk at home 10-30 minutes 2-3x/week.              Discharge Exercise Prescription (Final Exercise Prescription Changes):  Exercise Prescription Changes - 08/23/20 1228      Response to Exercise   Blood Pressure (Admit) 104/60    Blood Pressure (Exercise) 114/72    Blood Pressure (Exit) 102/60    Heart Rate (Admit) 65 bpm    Heart Rate (Exercise) 91 bpm    Heart Rate (Exit) 66 bpm    Rating of Perceived Exertion (Exercise) 12    Symptoms None    Comments Reviewed METs, Home exercise Rx, goals    Duration Continue with 30 min of aerobic exercise without signs/symptoms of physical distress.    Intensity THRR unchanged      Progression   Progression Continue to progress workloads to maintain intensity without signs/symptoms of physical distress.    Average METs 2.9      Resistance Training   Training Prescription Yes    Weight 2 lbs    Reps 10-15    Time 10 Minutes      Interval Training   Interval Training No      NuStep   Level 3    SPM 90    Minutes 15    METs 2.5      Track   Laps 20    Minutes 15    METs 3.32       Home Exercise Plan   Plans to continue exercise at Home (comment)    Frequency Add 2 additional days to program exercise sessions.    Initial Home Exercises Provided 08/23/20           Nutrition:  Target Goals: Understanding of nutrition guidelines, daily intake of sodium 1500mg , cholesterol 200mg , calories 30% from fat and 7% or less from saturated fats, daily to have 5 or more servings of fruits and vegetables.  Biometrics:  Pre Biometrics - 07/11/20 1315      Pre Biometrics   Waist Circumference 33 inches    Hip Circumference 39.5 inches    Waist to Hip Ratio 0.84 %    Triceps Skinfold 17 mm    % Body Fat 35 %    Grip Strength 24 kg    Flexibility 18 in    Single Leg Stand 3.12 seconds            Nutrition Therapy Plan and Nutrition Goals:  Nutrition Therapy & Goals - 07/24/20 0948      Nutrition Therapy   Diet TLC    Drug/Food Interactions Statins/Certain Fruits      Personal Nutrition Goals   Nutrition Goal Pt to build a healthy plate including vegetables, fruits, whole grains, and low-fat dairy products in a heart healthy meal plan.    Personal Goal #2 Pt to incorporate 28-30 g fiber per day    Personal Goal #3 Pt to reduce saturated fat to <12 g per day      Intervention Plan   Intervention Prescribe,  educate and counsel regarding individualized specific dietary modifications aiming towards targeted core components such as weight, hypertension, lipid management, diabetes, heart failure and other comorbidities.;Nutrition handout(s) given to patient.    Expected Outcomes Short Term Goal: A plan has been developed with personal nutrition goals set during dietitian appointment.;Long Term Goal: Adherence to prescribed nutrition plan.           Nutrition Assessments:  MEDIFICTS Score Key:  ?70 Need to make dietary changes   40-70 Heart Healthy Diet  ? 40 Therapeutic Level Cholesterol Diet  Flowsheet Row CARDIAC REHAB PHASE II EXERCISE from 07/24/2020 in  [place]  Picture Your Plate Total Score on Admission 62     Picture Your Plate Scores:  <39 Unhealthy dietary pattern with much room for improvement.  41-50 Dietary pattern unlikely to meet recommendations for good health and room for improvement.  51-60 More healthful dietary pattern, with some room for improvement.   >60 Healthy dietary pattern, although there may be some specific behaviors that could be improved.    Nutrition Goals Re-Evaluation:  Nutrition Goals Re-Evaluation    [place] Name 07/24/20 0949 08/02/20 1224 09/02/20 1444         Goals   Current Weight 127 lb (57.6 kg) 128 lb 1.4 oz (58.1 kg) 128 lb 12 oz (58.4 kg)     Nutrition Goal Pt to build a healthy plate including vegetables, fruits, whole grains, and low-fat dairy products in a heart healthy meal plan. Pt to build a healthy plate including vegetables, fruits, whole grains, and low-fat dairy products in a heart healthy meal plan. Pt to build a healthy plate including vegetables, fruits, whole grains, and low-fat dairy products in a heart healthy meal plan.           Personal Goal #2 Re-Evaluation   Personal Goal #2 Pt to incorporate 28-30 g fiber per day Pt to incorporate 28-30 g fiber per day Pt to incorporate 28-30 g fiber per day           Personal Goal #3 Re-Evaluation   Personal Goal #3 Pt to reduce saturated fat to <12 g per day Pt to reduce saturated fat to <12 g per day Pt to reduce saturated fat to <12 g per day            Nutrition Goals Discharge (Final Nutrition Goals Re-Evaluation):  Nutrition Goals Re-Evaluation - 09/02/20 1444      Goals   Current Weight 128 lb 12 oz (58.4 kg)    Nutrition Goal Pt to build a healthy plate including vegetables, fruits, whole grains, and low-fat dairy products in a heart healthy meal plan.      Personal Goal #2 Re-Evaluation   Personal Goal #2 Pt to incorporate 28-30 g fiber per day      Personal Goal #3 Re-Evaluation    Personal Goal #3 Pt to reduce saturated fat to <12 g per day           Psychosocial: Target Goals: Acknowledge presence or absence of significant depression and/or stress, maximize coping skills, provide positive support system. Participant is able to verbalize types and ability to use techniques and skills needed for reducing stress and depression.  Initial Review & Psychosocial Screening:  Initial Psych Review & Screening - 07/11/20 1348      Initial Review   Current issues with History of Depression      Family Dynamics   Good Support System? Yes   Christina [name]  lives alone. Christina [name] has her son for support who lives nearby if needed     Barriers   Psychosocial barriers to participate in program The patient should benefit from training in stress management and relaxation.;There are no identifiable barriers or psychosocial needs.      Screening Interventions   Interventions Encouraged to exercise;Provide feedback about the scores to participant    Expected Outcomes Long Term Goal: Stressors or current issues are controlled or eliminated.;Short Term goal: Identification and review with participant of any Quality of Life or Depression concerns found by scoring the questionnaire.;Long Term goal: The participant improves quality of Life and PHQ9 Scores as seen by post scores and/or verbalization of changes           Quality of Life Scores:  Quality of Life - 07/11/20 1441      Quality of Life   Select Quality of Life      Quality of Life Scores   Health/Function Pre 22.1 %    Socioeconomic Pre 22.67 %    Psych/Spiritual Pre 23 %    Family Pre 15.7 %    GLOBAL Pre 21.7 %          Scores of 19 and below usually indicate a poorer quality of life in these areas.  A difference of  2-3 points is a clinically meaningful difference.  A difference of 2-3 points in the total score of the Quality of Life Index has been associated with significant improvement in overall quality of life,  self-image, physical symptoms, and general health in studies assessing change in quality of life.  PHQ-9: Recent Review Flowsheet Data    Depression screen [facility] 2/9 07/11/2020   Decreased Interest 0   Down, Depressed, Hopeless 0   PHQ - 2 Score 0     Interpretation of Total Score  Total Score Depression Severity:  1-4 = Minimal depression, 5-9 = Mild depression, 10-14 = Moderate depression, 15-19 = Moderately severe depression, 20-27 = Severe depression   Psychosocial Evaluation and Intervention:   Psychosocial Re-Evaluation:  Psychosocial Re-Evaluation    [place] Name 07/17/20 0959 08/06/20 1359 09/03/20 1441         Psychosocial Re-Evaluation   Current issues with History of Depression History of Depression History of Depression     Comments Reviewed quality of life questionnaire with patient. Concerned about son and ex husband's health. Son is doing better Christina [name] has not voiced any increased or stressors or concerns. Will continue to monitor and offer support as needed Christina [name] has not voiced any increased or stressors or concerns during exercise at cardiac rehab. Will continue to monitor and offer support as needed     Expected Outcomes Christina [name] will have decreased stressors upon completion of phase 2 cardiac rehab Christina [name] will have decreased stressors upon completion of phase 2 cardiac rehab Christina [name] will have decreased stressors upon completion of phase 2 cardiac rehab     Interventions Stress management education;Encouraged to attend Cardiac Rehabilitation for the exercise Stress management education;Encouraged to attend Cardiac Rehabilitation for the exercise Stress management education;Encouraged to attend Cardiac Rehabilitation for the exercise     Continue Psychosocial Services  Follow up required by staff Follow up required by staff No Follow up required            Psychosocial Discharge (Final Psychosocial Re-Evaluation):  Psychosocial Re-Evaluation - 09/03/20 1441       Psychosocial Re-Evaluation   Current issues with History of Depression    Comments Christina [name] has not  voiced any increased or stressors or concerns during exercise at cardiac rehab. Will continue to monitor and offer support as needed    Expected Outcomes Christina [name] will have decreased stressors upon completion of phase 2 cardiac rehab    Interventions Stress management education;Encouraged to attend Cardiac Rehabilitation for the exercise    Continue Psychosocial Services  No Follow up required           Vocational Rehabilitation: Provide vocational rehab assistance to qualifying candidates.   Vocational Rehab Evaluation & Intervention:  Vocational Rehab - 07/11/20 1349      Initial Vocational Rehab Evaluation & Intervention   Assessment shows need for Vocational Rehabilitation No   Christina [name] is retired and does not need vocatinal rehab at this time          Education: Education Goals: Education classes will be provided on a weekly basis, covering required topics. Participant will state understanding/return demonstration of topics presented.  Learning Barriers/Preferences:  Learning Barriers/Preferences - 07/11/20 1441      Learning Barriers/Preferences   Learning Barriers Hearing;Sight   Wears glasses and hearing aids   Learning Preferences None           Education Topics: Hypertension, Hypertension Reduction -Define heart disease and high blood pressure. Discus how high blood pressure affects the body and ways to reduce high blood pressure.   Exercise and Your Heart -Discuss why it is important to exercise, the FITT principles of exercise, normal and abnormal responses to exercise, and how to exercise safely.   Angina -Discuss definition of angina, causes of angina, treatment of angina, and how to decrease risk of having angina.   Cardiac Medications -Review what the following cardiac medications are used for, how they affect the body, and side effects that may occur when  taking the medications.  Medications include Aspirin, Beta blockers, calcium channel blockers, ACE Inhibitors, angiotensin receptor blockers, diuretics, digoxin, and antihyperlipidemics.   Congestive Heart Failure -Discuss the definition of CHF, how to live with CHF, the signs and symptoms of CHF, and how keep track of weight and sodium intake.   Heart Disease and Intimacy -Discus the effect sexual activity has on the heart, how changes occur during intimacy as we age, and safety during sexual activity.   Smoking Cessation / COPD -Discuss different methods to quit smoking, the health benefits of quitting smoking, and the definition of COPD.   Nutrition I: Fats -Discuss the types of cholesterol, what cholesterol does to the heart, and how cholesterol levels can be controlled.   Nutrition II: Labels -Discuss the different components of food labels and how to read food label   Heart Parts/Heart Disease and PAD -Discuss the anatomy of the heart, the pathway of blood circulation through the heart, and these are affected by heart disease.   Stress I: Signs and Symptoms -Discuss the causes of stress, how stress may lead to anxiety and depression, and ways to limit stress.   Stress II: Relaxation -Discuss different types of relaxation techniques to limit stress.   Warning Signs of Stroke / TIA -Discuss definition of a stroke, what the signs and symptoms are of a stroke, and how to identify when someone is having stroke.   Knowledge Questionnaire Score:  Knowledge Questionnaire Score - 07/11/20 1440      Knowledge Questionnaire Score   Pre Score 18/24           Core Components/Risk Factors/Patient Goals at Admission:  Personal Goals and Risk Factors at Admission - 07/11/20 1445  Core Components/Risk Factors/Patient Goals on Admission    Weight Management Weight Maintenance    Intervention Weight Management: Provide education and appropriate resources to help  participant work on and attain dietary goals.;Weight Management: Develop a combined nutrition and exercise program designed to reach desired caloric intake, while maintaining appropriate intake of nutrient and fiber, sodium and fats, and appropriate energy expenditure required for the weight goal.    Admit Weight 127 lb 13.9 oz (58 kg)    Expected Outcomes Weight Maintenance: Understanding of the daily nutrition guidelines, which includes 25-35% calories from fat, 7% or less cal from saturated fats, less than 200mg  cholesterol, less than 1.5gm of sodium, & 5 or more servings of fruits and vegetables daily;Long Term: Adherence to nutrition and physical activity/exercise program aimed toward attainment of established weight goal;Short Term: Continue to assess and modify interventions until short term weight is achieved;Understanding recommendations for meals to include 15-35% energy as protein, 25-35% energy from fat, 35-60% energy from carbohydrates, less than 200mg  of dietary cholesterol, 20-35 gm of total fiber daily;Understanding of distribution of calorie intake throughout the day with the consumption of 4-5 meals/snacks    Hypertension Yes    Intervention Monitor prescription use compliance.;Provide education on lifestyle modifcations including regular physical activity/exercise, weight management, moderate sodium restriction and increased consumption of fresh fruit, vegetables, and low fat dairy, alcohol moderation, and smoking cessation.    Expected Outcomes Short Term: Continued assessment and intervention until BP is < 140/75mm HG in hypertensive participants. < 130/64mm HG in hypertensive participants with diabetes, heart failure or chronic kidney disease.;Long Term: Maintenance of blood pressure at goal levels.    Lipids Yes    Intervention Provide education and support for participant on nutrition & aerobic/resistive exercise along with prescribed medications to achieve LDL 70mg , HDL >40mg .     Expected Outcomes Short Term: Participant states understanding of desired cholesterol values and is compliant with medications prescribed. Participant is following exercise prescription and nutrition guidelines.;Long Term: Cholesterol controlled with medications as prescribed, with individualized exercise RX and with personalized nutrition plan. Value goals: LDL < 70mg , HDL > 40 mg.           Core Components/Risk Factors/Patient Goals Review:   Goals and Risk Factor Review    Row Name 07/17/20 1005 08/06/20 1402 09/03/20 1442         Core Components/Risk Factors/Patient Goals Review   Personal Goals Review Weight Management/Obesity;Hypertension;Lipids Weight Management/Obesity;Hypertension;Lipids Weight Management/Obesity;Hypertension;Lipids     Review Kirah started exercise on 07/17/20 and did well with exercise. Keonta did have to stopp on the track as she had some fatigue Jaemarie is doing well with exercise. Kimika's vital signs have been stable Demetrice continues to do well with exercise at cardiac rehab. Imanie's vital signs have been stable     Expected Outcomes Elisabella will continue to participate in phase 2 cardiac rehab for exercise, nutrition and stress manegment Kiarrah will continue to participate in phase 2 cardiac rehab for exercise, nutrition and stress manegment Presley will continue to participate in phase 2 cardiac rehab for exercise, nutrition and stress manegment            Core Components/Risk Factors/Patient Goals at Discharge (Final Review):   Goals and Risk Factor Review - 09/03/20 1442      Core Components/Risk Factors/Patient Goals Review   Personal Goals Review Weight Management/Obesity;Hypertension;Lipids    Review Jatavia continues to do well with exercise at cardiac rehab. Angles's vital signs have been stable    Expected  Outcomes Eunie will continue to participate in phase 2 cardiac rehab for exercise, nutrition and stress manegment           ITP Comments:   ITP Comments    Row Name 07/11/20 1347 07/17/20 0958 08/06/20 1357 09/03/20 1440     ITP Comments Dr Fransico Him MD, Medical Director 30 Day ITP Review. Andrina started exercise on 07/17/20 and did well with exercise 30 Day ITP Review. Zaryia has good attendance and participation in phase 2 cardiac rehab. 30 Day ITP Review. Jina continues to have  good attendance and participation in phase 2 cardiac rehab. Sarra's cardiac rehab has been extended to 09/20/20.           Comments:See ITP comments.Barnet Pall, RN,BSN 09/03/2020 2:46 PM

## 2020-09-04 ENCOUNTER — Encounter (HOSPITAL_COMMUNITY)
Admission: RE | Admit: 2020-09-04 | Discharge: 2020-09-04 | Disposition: A | Discharge status: Home / Self Care | Payer: Medicare Other | Source: Ambulatory Visit | Attending: Internal Medicine | Admitting: Internal Medicine

## 2020-09-04 ENCOUNTER — Other Ambulatory Visit: Payer: Self-pay

## 2020-09-04 DIAGNOSIS — Z955 Presence of coronary angioplasty implant and graft: Secondary | ICD-10-CM

## 2020-09-06 ENCOUNTER — Encounter (HOSPITAL_COMMUNITY)
Admission: RE | Admit: 2020-09-06 | Discharge: 2020-09-06 | Disposition: A | Discharge status: Home / Self Care | Payer: Medicare Other | Source: Ambulatory Visit | Attending: Internal Medicine | Admitting: Internal Medicine

## 2020-09-06 ENCOUNTER — Other Ambulatory Visit: Payer: Self-pay

## 2020-09-06 DIAGNOSIS — Z955 Presence of coronary angioplasty implant and graft: Secondary | ICD-10-CM | POA: Diagnosis not present

## 2020-09-11 ENCOUNTER — Other Ambulatory Visit: Payer: Self-pay

## 2020-09-11 ENCOUNTER — Encounter (HOSPITAL_COMMUNITY)
Admission: RE | Admit: 2020-09-11 | Discharge: 2020-09-11 | Disposition: A | Discharge status: Home / Self Care | Payer: Medicare Other | Source: Ambulatory Visit | Attending: Internal Medicine | Admitting: Internal Medicine

## 2020-09-11 DIAGNOSIS — Z955 Presence of coronary angioplasty implant and graft: Secondary | ICD-10-CM | POA: Diagnosis not present

## 2020-09-13 ENCOUNTER — Encounter (HOSPITAL_COMMUNITY)
Admission: RE | Admit: 2020-09-13 | Discharge: 2020-09-13 | Disposition: A | Discharge status: Home / Self Care | Payer: Medicare Other | Source: Ambulatory Visit | Attending: Internal Medicine | Admitting: Internal Medicine

## 2020-09-13 ENCOUNTER — Other Ambulatory Visit: Payer: Self-pay

## 2020-09-13 DIAGNOSIS — Z955 Presence of coronary angioplasty implant and graft: Secondary | ICD-10-CM

## 2020-09-16 ENCOUNTER — Other Ambulatory Visit: Payer: Self-pay

## 2020-09-16 ENCOUNTER — Encounter (HOSPITAL_COMMUNITY)
Admission: RE | Admit: 2020-09-16 | Discharge: 2020-09-16 | Disposition: A | Discharge status: Home / Self Care | Payer: Medicare Other | Source: Ambulatory Visit | Attending: Internal Medicine | Admitting: Internal Medicine

## 2020-09-16 DIAGNOSIS — Z955 Presence of coronary angioplasty implant and graft: Secondary | ICD-10-CM

## 2020-09-18 ENCOUNTER — Encounter (HOSPITAL_COMMUNITY)
Admission: RE | Admit: 2020-09-18 | Discharge: 2020-09-18 | Disposition: A | Discharge status: Home / Self Care | Payer: Medicare Other | Source: Ambulatory Visit | Attending: Internal Medicine | Admitting: Internal Medicine

## 2020-09-18 ENCOUNTER — Other Ambulatory Visit: Payer: Self-pay

## 2020-09-18 VITALS — Ht 61.75 in | Wt 126.5 lb

## 2020-09-18 DIAGNOSIS — Z955 Presence of coronary angioplasty implant and graft: Secondary | ICD-10-CM | POA: Diagnosis not present

## 2020-09-20 ENCOUNTER — Encounter (HOSPITAL_COMMUNITY)
Admission: RE | Admit: 2020-09-20 | Discharge: 2020-09-20 | Disposition: A | Discharge status: Home / Self Care | Payer: Medicare Other | Source: Ambulatory Visit | Attending: Internal Medicine | Admitting: Internal Medicine

## 2020-09-20 ENCOUNTER — Other Ambulatory Visit: Payer: Self-pay

## 2020-09-20 DIAGNOSIS — Z955 Presence of coronary angioplasty implant and graft: Secondary | ICD-10-CM | POA: Diagnosis not present

## 2020-09-20 NOTE — Progress Notes (Signed)
Discharge Progress Report  Patient Details  Name: Christina [name] MRN: [id number] Date of Birth: Jan 17, 1935 Referring Provider:   Flowsheet Row CARDIAC REHAB PHASE II ORIENTATION from 07/11/2020 in [place]  Referring Provider Rudean Haskell, MD        Number of Visits: 23  Reason for Discharge:  Patient reached a stable level of exercise. Patient independent in their exercise. Patient has met program and personal goals.  Smoking History:  Social History   Tobacco Use  Smoking Status Never  Smokeless Tobacco Never    Diagnosis:  05/21/20 S/P DES RCA   ADL UCSD:   Initial Exercise Prescription:  Initial Exercise Prescription - 07/11/20 1400       Date of Initial Exercise RX and Referring Provider   Date 07/11/20    Referring Provider Rudean Haskell, MD    Expected Discharge Date 09/06/20      NuStep   Level 2    SPM 75    Minutes 15    METs 2      Track   Laps 12    Minutes 15    METs 2.39      Prescription Details   Frequency (times per week) 3    Duration Progress to 30 minutes of continuous aerobic without signs/symptoms of physical distress      Intensity   THRR 40-80% of Max Heartrate 54-109    Ratings of Perceived Exertion 11-13    Perceived Dyspnea 0-4      Progression   Progression Continue progressive overload as per policy without signs/symptoms or physical distress.      Resistance Training   Training Prescription Yes    Weight 2 lbs    Reps 10-15             Discharge Exercise Prescription (Final Exercise Prescription Changes):  Exercise Prescription Changes - 09/20/20 1200       Response to Exercise   Blood Pressure (Admit) 122/80    Blood Pressure (Exercise) 144/82    Blood Pressure (Exit) 108/68    Heart Rate (Admit) 65 bpm    Heart Rate (Exercise) 101 bpm    Heart Rate (Exit) 73 bpm    Rating of Perceived Exertion (Exercise) 12    Symptoms None    Comments Pt graduated from  the CRP2 program    Duration Continue with 30 min of aerobic exercise without signs/symptoms of physical distress.    Intensity THRR unchanged      Progression   Progression Continue to progress workloads to maintain intensity without signs/symptoms of physical distress.    Average METs 3.2      Resistance Training   Training Prescription Yes    Weight 2 lbs    Reps 10-15    Time 10 Minutes      Interval Training   Interval Training No      NuStep   Level 3    SPM 95    Minutes 15    METs 3      Track   Laps 20    Minutes 15    METs 3.32      Home Exercise Plan   Plans to continue exercise at Home (comment)    Frequency Add 2 additional days to program exercise sessions.    Initial Home Exercises Provided 08/23/20             Functional Capacity:  6 Minute Walk     Row Name  07/11/20 1359 09/16/20 0910       6 Minute Walk   Phase Initial Discharge    Distance 1376 feet 1400 feet    Distance % Change -- 1.76 %    Distance Feet Change -- 24 ft    Walk Time 6 minutes 6 minutes    # of Rest Breaks 0 0    MPH 2.6 2.65    METS 2.35 2.6    RPE 13 12    Perceived Dyspnea  1 0    VO2 Peak 8.24 9.07    Symptoms Yes (comment) No    Comments SOB, RPD = 1 --    Resting HR 70 bpm 72 bpm    Resting BP 122/74 126/62    Resting Oxygen Saturation  96 % 98 %    Exercise Oxygen Saturation  during 6 min walk 96 % 99 %    Max Ex. HR 93 bpm 106 bpm    Max Ex. BP 140/80 148/80    2 Minute Post BP 128/72 --             Psychological, QOL, Others - Outcomes: PHQ 2/9: Depression screen PHQ 2/9 07/11/2020  Decreased Interest 0  Down, Depressed, Hopeless 0  PHQ - 2 Score 0  Some recent data might be hidden    Quality of Life:  Quality of Life - 09/18/20 1434       Quality of Life Scores   Health/Function Post 20.83 %    Socioeconomic Post 21.07 %    Psych/Spiritual Post 24 %    Family Post 18.88 %    GLOBAL Post 21.32 %             Personal  Goals: Goals established at orientation with interventions provided to work toward goal.  Personal Goals and Risk Factors at Admission - 07/11/20 1445       Core Components/Risk Factors/Patient Goals on Admission    Weight Management Weight Maintenance    Intervention Weight Management: Provide education and appropriate resources to help participant work on and attain dietary goals.;Weight Management: Develop a combined nutrition and exercise program designed to reach desired caloric intake, while maintaining appropriate intake of nutrient and fiber, sodium and fats, and appropriate energy expenditure required for the weight goal.    Admit Weight 127 lb 13.9 oz (58 kg)    Expected Outcomes Weight Maintenance: Understanding of the daily nutrition guidelines, which includes 25-35% calories from fat, 7% or less cal from saturated fats, less than 232m cholesterol, less than 1.5gm of sodium, & 5 or more servings of fruits and vegetables daily;Long Term: Adherence to nutrition and physical activity/exercise program aimed toward attainment of established weight goal;Short Term: Continue to assess and modify interventions until short term weight is achieved;Understanding recommendations for meals to include 15-35% energy as protein, 25-35% energy from fat, 35-60% energy from carbohydrates, less than 2031mof dietary cholesterol, 20-35 gm of total fiber daily;Understanding of distribution of calorie intake throughout the day with the consumption of 4-5 meals/snacks    Hypertension Yes    Intervention Monitor prescription use compliance.;Provide education on lifestyle modifcations including regular physical activity/exercise, weight management, moderate sodium restriction and increased consumption of fresh fruit, vegetables, and low fat dairy, alcohol moderation, and smoking cessation.    Expected Outcomes Short Term: Continued assessment and intervention until BP is < 140/9034mG in hypertensive participants. <  130/52m67m in hypertensive participants with diabetes, heart failure or chronic kidney disease.;Long Term: Maintenance of  blood pressure at goal levels.    Lipids Yes    Intervention Provide education and support for participant on nutrition & aerobic/resistive exercise along with prescribed medications to achieve LDL <4m, HDL >417m    Expected Outcomes Short Term: Participant states understanding of desired cholesterol values and is compliant with medications prescribed. Participant is following exercise prescription and nutrition guidelines.;Long Term: Cholesterol controlled with medications as prescribed, with individualized exercise RX and with personalized nutrition plan. Value goals: LDL < 7032mHDL > 40 mg.              Personal Goals Discharge:  Goals and Risk Factor Review     Row Name 07/17/20 1005 08/06/20 1402 09/03/20 1442         Core Components/Risk Factors/Patient Goals Review   Personal Goals Review Weight Management/Obesity;Hypertension;Lipids Weight Management/Obesity;Hypertension;Lipids Weight Management/Obesity;Hypertension;Lipids     Review Christina [name] started exercise on 07/17/20 and did well with exercise. Christina [name] did have to stopp on the track as she had some fatigue Christina [name] doing well with exercise. Christina [name]'s vital signs have been stable Christina [name] to do well with exercise at cardiac rehab. Christina [name]'s vital signs have been stable     Expected Outcomes Christina [name] will continue to participate in phase 2 cardiac rehab for exercise, nutrition and stress manegment Christina [name] continue to participate in phase 2 cardiac rehab for exercise, nutrition and stress manegment Christina [name] continue to participate in phase 2 cardiac rehab for exercise, nutrition and stress manegment              Exercise Goals and Review:  Exercise Goals     Row Name 07/11/20 1456             Exercise Goals   Increase Physical Activity Yes       Intervention Provide advice, education,  support and counseling about physical activity/exercise needs.;Develop an individualized exercise prescription for aerobic and resistive training based on initial evaluation findings, risk stratification, comorbidities and participant's personal goals.       Expected Outcomes Short Term: Attend rehab on a regular basis to increase amount of physical activity.;Long Term: Add in home exercise to make exercise part of routine and to increase amount of physical activity.;Long Term: Exercising regularly at least 3-5 days a week.       Increase Strength and Stamina Yes       Intervention Provide advice, education, support and counseling about physical activity/exercise needs.;Develop an individualized exercise prescription for aerobic and resistive training based on initial evaluation findings, risk stratification, comorbidities and participant's personal goals.       Expected Outcomes Short Term: Increase workloads from initial exercise prescription for resistance, speed, and METs.;Short Term: Perform resistance training exercises routinely during rehab and add in resistance training at home;Long Term: Improve cardiorespiratory fitness, muscular endurance and strength as measured by increased METs and functional capacity (6MWT)       Able to understand and use rate of perceived exertion (RPE) scale Yes       Intervention Provide education and explanation on how to use RPE scale       Expected Outcomes Short Term: Able to use RPE daily in rehab to express subjective intensity level;Long Term:  Able to use RPE to guide intensity level when exercising independently       Knowledge and understanding of Target Heart Rate Range (THRR) Yes       Intervention Provide education and explanation of THRR including how the numbers were predicted and  where they are located for reference       Expected Outcomes Short Term: Able to state/look up THRR;Short Term: Able to use daily as guideline for intensity in rehab;Long Term:  Able to use THRR to govern intensity when exercising independently       Understanding of Exercise Prescription Yes       Intervention Provide education, explanation, and written materials on patient's individual exercise prescription       Expected Outcomes Short Term: Able to explain program exercise prescription;Long Term: Able to explain home exercise prescription to exercise independently                Exercise Goals Re-Evaluation:  Exercise Goals Re-Evaluation     Row Name 07/17/20 1005 08/23/20 1015 09/20/20 1233         Exercise Goal Re-Evaluation   Exercise Goals Review Increase Physical Activity;Increase Strength and Stamina;Able to understand and use rate of perceived exertion (RPE) scale;Knowledge and understanding of Target Heart Rate Range (THRR);Understanding of Exercise Prescription Increase Physical Activity;Increase Strength and Stamina;Able to understand and use rate of perceived exertion (RPE) scale;Knowledge and understanding of Target Heart Rate Range (THRR);Able to check pulse independently;Understanding of Exercise Prescription Increase Physical Activity;Increase Strength and Stamina;Able to understand and use rate of perceived exertion (RPE) scale;Knowledge and understanding of Target Heart Rate Range (THRR);Able to check pulse independently;Understanding of Exercise Prescription     Comments Pt's first day of exercise in the [facility]2 program. Pt understands the exercise Rx, THRR, and RPE scale. Reviewed METs, Goals and home exercise Rx with patient. Pt not walking at home but is active doing yard work and cleaning her home. Encouraged patient to incorporate purposeful walking into her activities. 10-30 minutes 2-3x/week Pt graduated from the [facility]. Pt made good progress and had an average MET level of 3.2. Pt planso continue at the [facility] walking on the treadmill and swimming.     Expected Outcomes WIll continue to monitor patient and progress as tolerated. Pt will  walk at home 10-30 minutes 2-3x/week. Pt will continue to exericse at home on her own.              Nutrition & Weight - Outcomes:  Pre Biometrics - 07/11/20 1315       Pre Biometrics   Waist Circumference 33 inches    Hip Circumference 39.5 inches    Waist to Hip Ratio 0.84 %    Triceps Skinfold 17 mm    % Body Fat 35 %    Grip Strength 24 kg    Flexibility 18 in    Single Leg Stand 3.12 seconds             Post Biometrics - 09/18/20 0940        Post  Biometrics   Height 5' 1.75" (1.568 m)    Weight 57.4 kg    Waist Circumference 33 inches    Hip Circumference 39.5 inches    Waist to Hip Ratio 0.84 %    BMI (Calculated) 23.35    Triceps Skinfold 15 mm    % Body Fat 34.3 %    Grip Strength 23 kg    Flexibility 17.5 in    Single Leg Stand 3.82 seconds             Nutrition:  Nutrition Therapy & Goals - 07/24/20 0948       Nutrition Therapy   Diet TLC    Drug/Food Interactions Statins/Certain Fruits  Personal Nutrition Goals   Nutrition Goal Pt to build a healthy plate including vegetables, fruits, whole grains, and low-fat dairy products in a heart healthy meal plan.    Personal Goal #2 Pt to incorporate 28-30 g fiber per day    Personal Goal #3 Pt to reduce saturated fat to <12 g per day      Intervention Plan   Intervention Prescribe, educate and counsel regarding individualized specific dietary modifications aiming towards targeted core components such as weight, hypertension, lipid management, diabetes, heart failure and other comorbidities.;Nutrition handout(s) given to patient.    Expected Outcomes Short Term Goal: A plan has been developed with personal nutrition goals set during dietitian appointment.;Long Term Goal: Adherence to prescribed nutrition plan.             Nutrition Discharge:   Education Questionnaire Score:  Knowledge Questionnaire Score - 07/11/20 1440       Knowledge Questionnaire Score   Pre Score 18/24              Goals reviewed with patient; copy given to patient.Pt graduated from cardiac rehab program on 09/1020 with completion of  exercise sessions in Phase II. Pt maintained good attendance and progressed nicely during his participation in rehab as evidenced by increased MET level.   Medication list reconciled. Repeat  PHQ score- 0 .  Pt has made significant lifestyle changes and should be commended for her success. Pt feels she has achieved her goals during cardiac rehab.   [facility] [place] did great with exercise. Christina [name] increased her distance on her post exercise walk test by 24 feet. We are proud of Christina [name]'s progress.Christina Pall, Christina [name],Christina [name] 09/26/2020 10:38 AM

## 2020-09-23 ENCOUNTER — Encounter (HOSPITAL_COMMUNITY): Payer: Medicare Other

## 2020-09-23 ENCOUNTER — Other Ambulatory Visit: Payer: Self-pay

## 2020-09-23 DIAGNOSIS — E782 Mixed hyperlipidemia: Secondary | ICD-10-CM

## 2020-09-23 MED ORDER — ROSUVASTATIN CALCIUM 10 MG PO TABS: 10.0000 mg | ORAL_TABLET | Freq: Every day | ORAL | 2 refills | Status: AC

## 2020-10-07 ENCOUNTER — Telehealth: Payer: Self-pay | Admitting: *Deleted

## 2020-10-07 NOTE — Telephone Encounter (Signed)
-----   Message from Larina Bras, [place] sent at 05/30/2020  4:46 PM EST ----- Patient needs office visit around 11/11/2020. See 05/30/20 telephone note

## 2020-10-07 NOTE — Telephone Encounter (Signed)
Left message for patient to call back  

## 2020-10-08 NOTE — Telephone Encounter (Signed)
I have left a message on patient's voicemail to call back.

## 2020-10-10 ENCOUNTER — Encounter: Payer: Self-pay | Admitting: Internal Medicine

## 2020-10-10 NOTE — Telephone Encounter (Signed)
I have left one final message for patient to call back. We will also send a recall office visit letter to patient.

## 2020-10-21 NOTE — Telephone Encounter (Signed)
Patient called back stating she is not ready to schedule appt.  She will call back in a couple of months if she changes her mind.

## 2020-12-02 IMAGING — MG DIGITAL SCREENING BILAT W/ TOMO W/ CAD
8 series · 8 of 24 positions shown · non-contrast
Comparison: Previous exam(s).

CLINICAL DATA: Screening.

EXAM:
DIGITAL SCREENING BILATERAL MAMMOGRAM WITH TOMO AND CAD

[R MLO synth-2D]
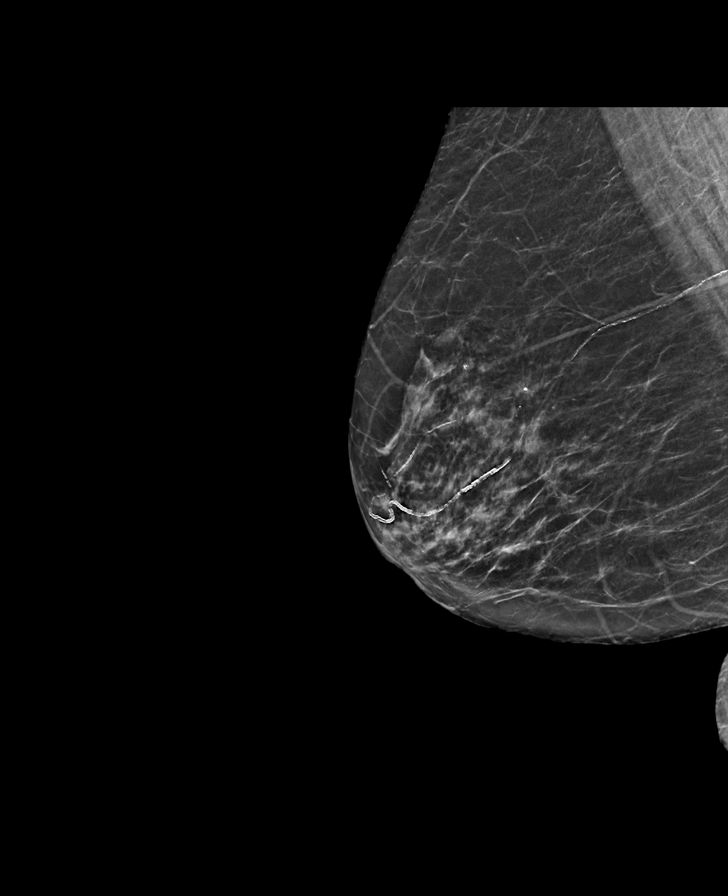

[L MLO synth-2D]
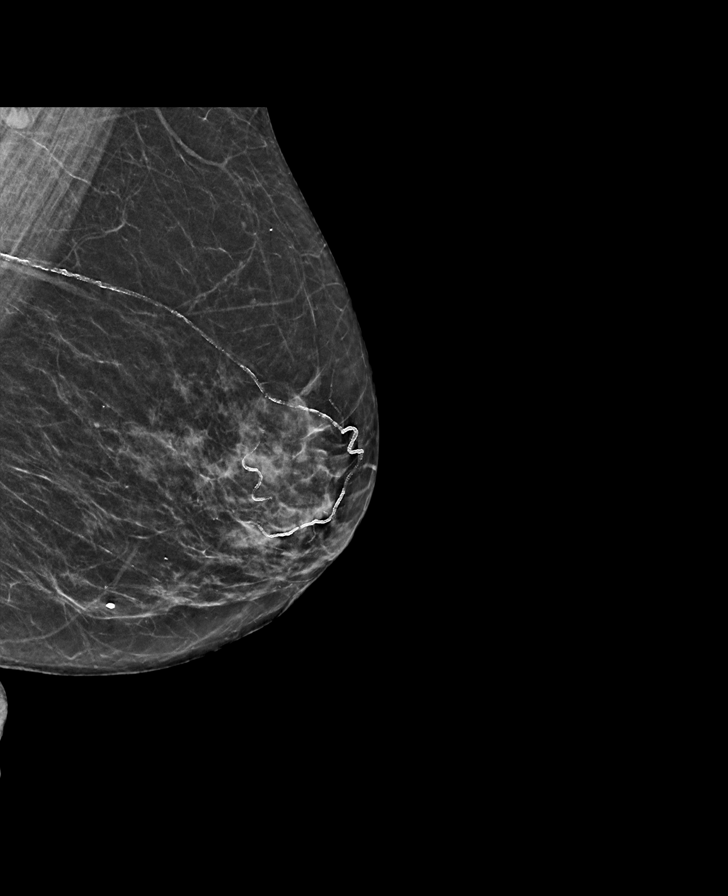

[R CC synth-2D]
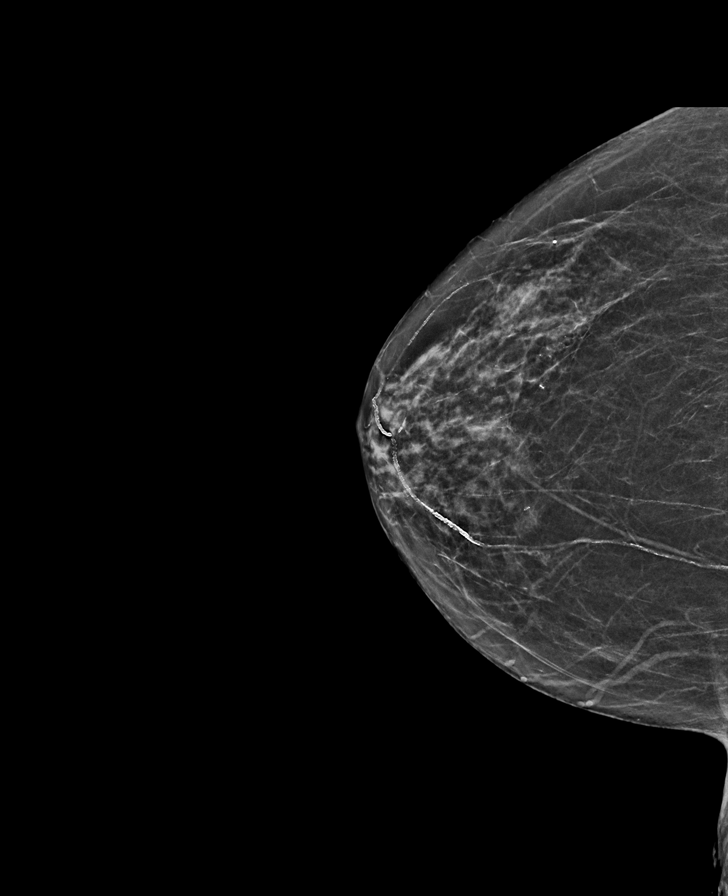

[L CC synth-2D]
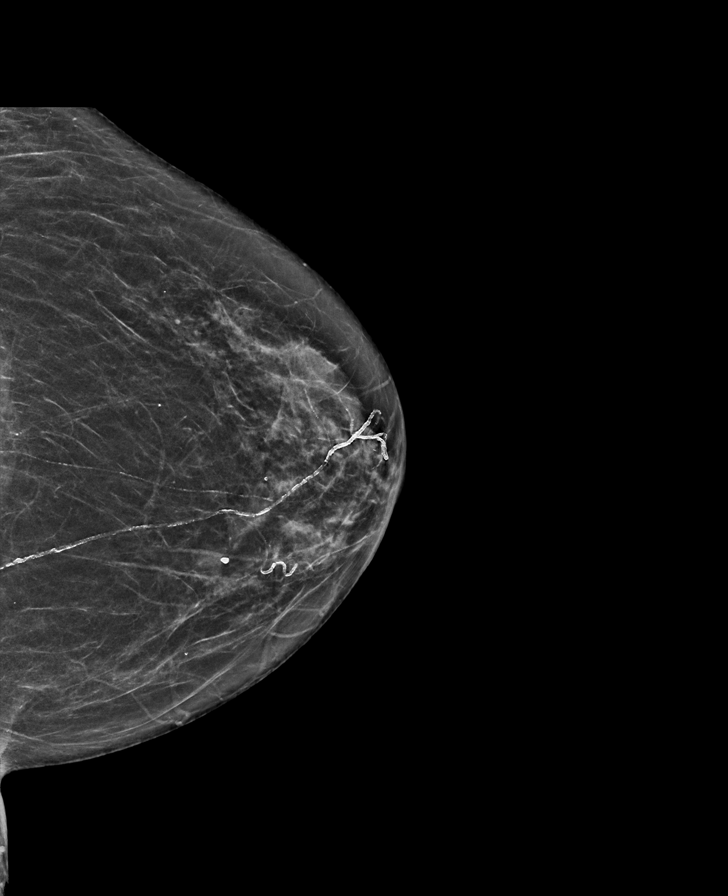

[L MLO tomo · tomo slice 28/55.0]
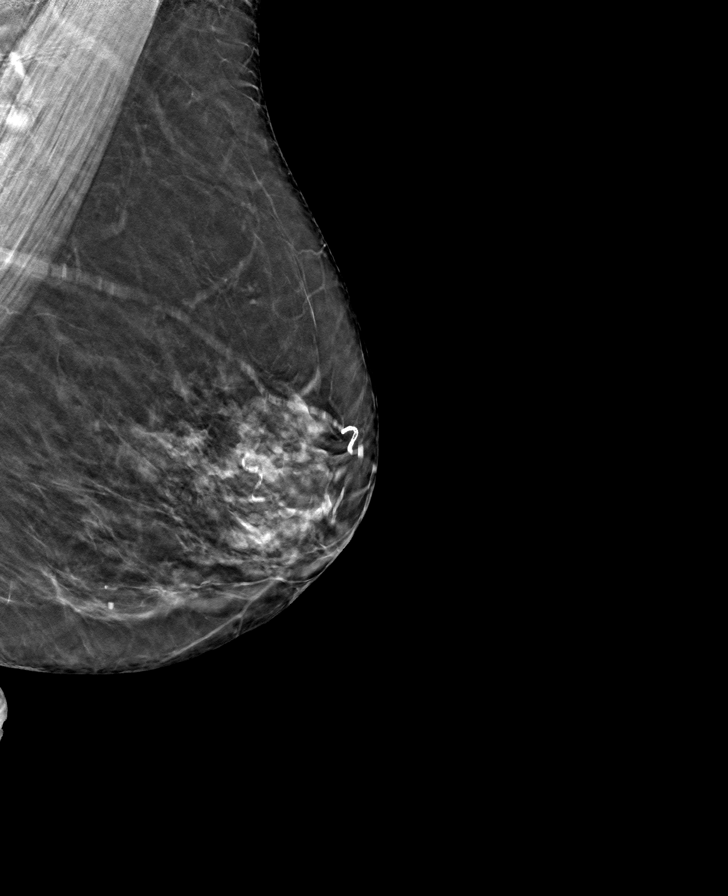

[R MLO tomo · tomo slice 29/56.0]
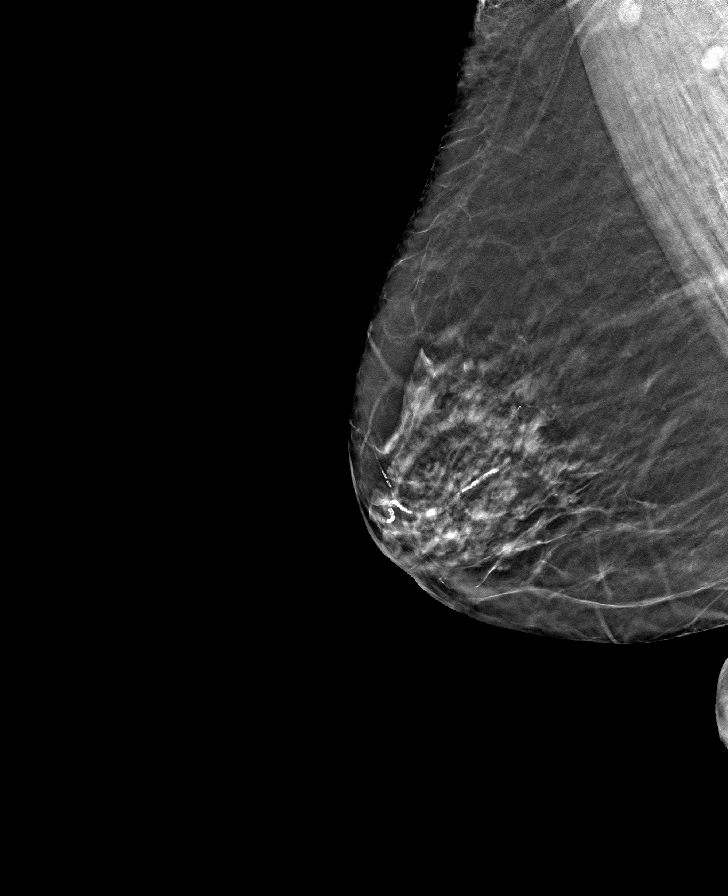

[L CC tomo · tomo slice 29/56.0]
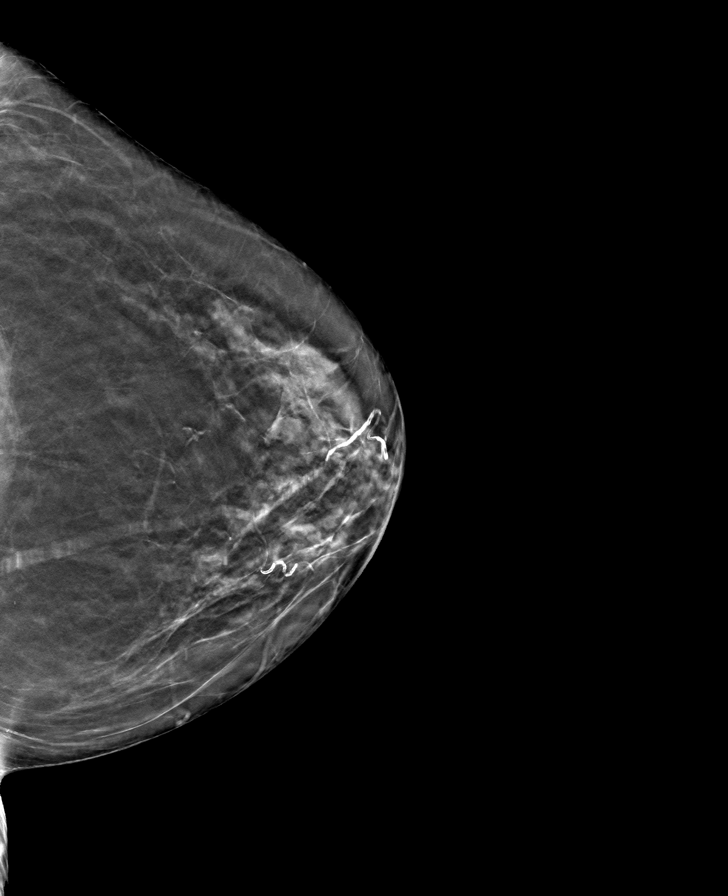

[R CC tomo · tomo slice 27/53.0]
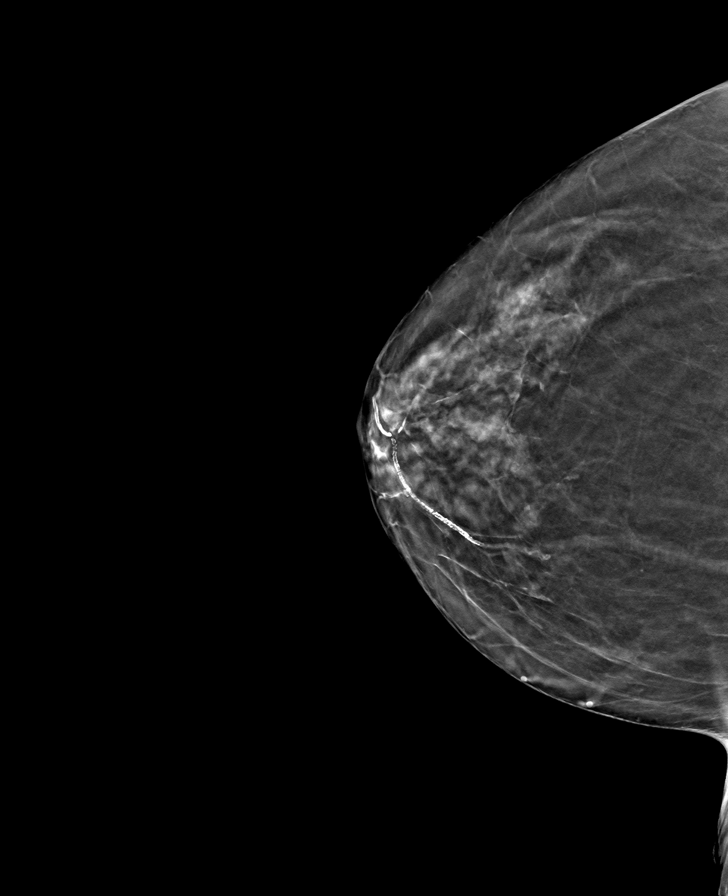

[8 of 24 positions shown; findings below may reference images not displayed]

ACR Breast Density Category c: The breast tissue is heterogeneously
dense, which may obscure small masses.
FINDINGS: There are no findings suspicious for malignancy. Images were
processed with CAD.
IMPRESSION: No mammographic evidence of malignancy. A result letter of this
screening mammogram will be mailed directly to the patient.

RECOMMENDATION:
Screening mammogram in one year. (Code:[id number])

BI-RADS CATEGORY  1: Negative.

## 2020-12-30 ENCOUNTER — Other Ambulatory Visit: Payer: Self-pay | Admitting: Internal Medicine

## 2020-12-30 DIAGNOSIS — Z1231 Encounter for screening mammogram for malignant neoplasm of breast: Secondary | ICD-10-CM

## 2021-02-13 ENCOUNTER — Ambulatory Visit
Admission: RE | Admit: 2021-02-13 | Discharge: 2021-02-13 | Disposition: A | Discharge status: Home / Self Care | Payer: Medicare Other | Source: Ambulatory Visit | Attending: Internal Medicine | Admitting: Internal Medicine

## 2021-02-13 DIAGNOSIS — Z1231 Encounter for screening mammogram for malignant neoplasm of breast: Secondary | ICD-10-CM

## 2021-05-05 ENCOUNTER — Emergency Department (HOSPITAL_COMMUNITY): Payer: Medicare Other

## 2021-05-05 ENCOUNTER — Other Ambulatory Visit: Payer: Self-pay

## 2021-05-05 ENCOUNTER — Encounter (HOSPITAL_COMMUNITY): Payer: Self-pay | Admitting: Emergency Medicine

## 2021-05-05 ENCOUNTER — Inpatient Hospital Stay (HOSPITAL_COMMUNITY): Payer: Medicare Other

## 2021-05-05 ENCOUNTER — Inpatient Hospital Stay (HOSPITAL_COMMUNITY)
Admission: EM | Admit: 2021-05-05 | Discharge: 2021-05-14 | DRG: 871 | Disposition: E | Payer: Medicare Other | Attending: Critical Care Medicine | Admitting: Critical Care Medicine

## 2021-05-05 DIAGNOSIS — Z955 Presence of coronary angioplasty implant and graft: Secondary | ICD-10-CM | POA: Diagnosis not present

## 2021-05-05 DIAGNOSIS — Z20822 Contact with and (suspected) exposure to covid-19: Secondary | ICD-10-CM | POA: Diagnosis present

## 2021-05-05 DIAGNOSIS — I5021 Acute systolic (congestive) heart failure: Secondary | ICD-10-CM | POA: Diagnosis present

## 2021-05-05 DIAGNOSIS — R7401 Elevation of levels of liver transaminase levels: Secondary | ICD-10-CM | POA: Diagnosis present

## 2021-05-05 DIAGNOSIS — N179 Acute kidney failure, unspecified: Secondary | ICD-10-CM | POA: Diagnosis present

## 2021-05-05 DIAGNOSIS — Z515 Encounter for palliative care: Secondary | ICD-10-CM

## 2021-05-05 DIAGNOSIS — E871 Hypo-osmolality and hyponatremia: Secondary | ICD-10-CM | POA: Diagnosis present

## 2021-05-05 DIAGNOSIS — A419 Sepsis, unspecified organism: Secondary | ICD-10-CM | POA: Diagnosis present

## 2021-05-05 DIAGNOSIS — R0603 Acute respiratory distress: Secondary | ICD-10-CM | POA: Diagnosis not present

## 2021-05-05 DIAGNOSIS — E1165 Type 2 diabetes mellitus with hyperglycemia: Secondary | ICD-10-CM | POA: Diagnosis present

## 2021-05-05 DIAGNOSIS — E039 Hypothyroidism, unspecified: Secondary | ICD-10-CM | POA: Diagnosis present

## 2021-05-05 DIAGNOSIS — E872 Acidosis, unspecified: Secondary | ICD-10-CM

## 2021-05-05 DIAGNOSIS — E875 Hyperkalemia: Secondary | ICD-10-CM | POA: Diagnosis present

## 2021-05-05 DIAGNOSIS — Z79899 Other long term (current) drug therapy: Secondary | ICD-10-CM

## 2021-05-05 DIAGNOSIS — Z66 Do not resuscitate: Secondary | ICD-10-CM | POA: Diagnosis present

## 2021-05-05 DIAGNOSIS — F32A Depression, unspecified: Secondary | ICD-10-CM | POA: Diagnosis present

## 2021-05-05 DIAGNOSIS — I429 Cardiomyopathy, unspecified: Secondary | ICD-10-CM | POA: Diagnosis present

## 2021-05-05 DIAGNOSIS — Z85828 Personal history of other malignant neoplasm of skin: Secondary | ICD-10-CM

## 2021-05-05 DIAGNOSIS — R17 Unspecified jaundice: Secondary | ICD-10-CM | POA: Diagnosis present

## 2021-05-05 DIAGNOSIS — D689 Coagulation defect, unspecified: Secondary | ICD-10-CM | POA: Diagnosis present

## 2021-05-05 DIAGNOSIS — E8721 Acute metabolic acidosis: Secondary | ICD-10-CM | POA: Diagnosis present

## 2021-05-05 DIAGNOSIS — J9601 Acute respiratory failure with hypoxia: Secondary | ICD-10-CM | POA: Diagnosis present

## 2021-05-05 DIAGNOSIS — Z888 Allergy status to other drugs, medicaments and biological substances status: Secondary | ICD-10-CM

## 2021-05-05 DIAGNOSIS — R570 Cardiogenic shock: Secondary | ICD-10-CM | POA: Diagnosis present

## 2021-05-05 DIAGNOSIS — K449 Diaphragmatic hernia without obstruction or gangrene: Secondary | ICD-10-CM | POA: Diagnosis present

## 2021-05-05 DIAGNOSIS — I110 Hypertensive heart disease with heart failure: Secondary | ICD-10-CM | POA: Diagnosis present

## 2021-05-05 DIAGNOSIS — Z885 Allergy status to narcotic agent status: Secondary | ICD-10-CM

## 2021-05-05 DIAGNOSIS — Z7902 Long term (current) use of antithrombotics/antiplatelets: Secondary | ICD-10-CM

## 2021-05-05 DIAGNOSIS — E785 Hyperlipidemia, unspecified: Secondary | ICD-10-CM | POA: Diagnosis present

## 2021-05-05 DIAGNOSIS — I3139 Other pericardial effusion (noninflammatory): Secondary | ICD-10-CM | POA: Diagnosis present

## 2021-05-05 DIAGNOSIS — Z7982 Long term (current) use of aspirin: Secondary | ICD-10-CM

## 2021-05-05 DIAGNOSIS — E860 Dehydration: Secondary | ICD-10-CM | POA: Diagnosis present

## 2021-05-05 DIAGNOSIS — I2510 Atherosclerotic heart disease of native coronary artery without angina pectoris: Secondary | ICD-10-CM | POA: Diagnosis present

## 2021-05-05 DIAGNOSIS — R55 Syncope and collapse: Secondary | ICD-10-CM

## 2021-05-05 DIAGNOSIS — Z8249 Family history of ischemic heart disease and other diseases of the circulatory system: Secondary | ICD-10-CM

## 2021-05-05 DIAGNOSIS — F419 Anxiety disorder, unspecified: Secondary | ICD-10-CM | POA: Diagnosis present

## 2021-05-05 DIAGNOSIS — E1140 Type 2 diabetes mellitus with diabetic neuropathy, unspecified: Secondary | ICD-10-CM | POA: Diagnosis present

## 2021-05-05 DIAGNOSIS — Z79890 Hormone replacement therapy (postmenopausal): Secondary | ICD-10-CM

## 2021-05-05 DIAGNOSIS — Z833 Family history of diabetes mellitus: Secondary | ICD-10-CM

## 2021-05-05 LAB — LACTIC ACID, PLASMA
Lactic Acid, Venous: 6.3 mmol/L (ref 0.5–1.9)
Lactic Acid, Venous: 6.9 mmol/L (ref 0.5–1.9)
Lactic Acid, Venous: >9 mmol/L (ref 0.5–1.9)
Lactic Acid, Venous: >9 mmol/L (ref 0.5–1.9)

## 2021-05-05 LAB — ECHOCARDIOGRAM COMPLETE
Height: 61.75 in
S' Lateral: 2.7 cm
Weight: 1952 oz

## 2021-05-05 LAB — POCT I-STAT 7, (LYTES, BLD GAS, ICA,H+H)
Acid-base deficit: 24 mmol/L — ABNORMAL HIGH (ref 0.0–2.0)
Bicarbonate: 4.2 mmol/L — ABNORMAL LOW (ref 20.0–28.0)
Calcium, Ion: 1.13 mmol/L — ABNORMAL LOW (ref 1.15–1.40)
HCT: 60 % — ABNORMAL HIGH (ref 36.0–46.0)
Hemoglobin: 20.4 g/dL — ABNORMAL HIGH (ref 12.0–15.0)
O2 Saturation: 99 %
Potassium: 5.5 mmol/L — ABNORMAL HIGH (ref 3.5–5.1)
Sodium: 122 mmol/L — ABNORMAL LOW (ref 135–145)
TCO2: <5 mmol/L — ABNORMAL LOW (ref 22–32)
pCO2 arterial: 15.2 mmHg — CL (ref 32.0–48.0)
pH, Arterial: 7.052 — CL (ref 7.350–7.450)
pO2, Arterial: 184 mmHg — ABNORMAL HIGH (ref 83.0–108.0)

## 2021-05-05 LAB — TROPONIN I (HIGH SENSITIVITY)
Troponin I (High Sensitivity): 23 ng/L — ABNORMAL HIGH (ref <18)
Troponin I (High Sensitivity): 34 ng/L — ABNORMAL HIGH (ref <18)
Troponin I (High Sensitivity): 300 ng/L (ref <18)

## 2021-05-05 LAB — CBG MONITORING, ED
Glucose-Capillary: 349 mg/dL — ABNORMAL HIGH (ref 70–99)
Glucose-Capillary: 434 mg/dL — ABNORMAL HIGH (ref 70–99)

## 2021-05-05 LAB — PROTIME-INR
INR: 1.3 — ABNORMAL HIGH (ref 0.8–1.2)
INR: 1.5 — ABNORMAL HIGH (ref 0.8–1.2)
Prothrombin Time: 16 seconds — ABNORMAL HIGH (ref 11.4–15.2)
Prothrombin Time: 18.2 seconds — ABNORMAL HIGH (ref 11.4–15.2)

## 2021-05-05 LAB — CBC
HCT: 55 % — ABNORMAL HIGH (ref 36.0–46.0)
HCT: 54.7 % — ABNORMAL HIGH (ref 36.0–46.0)
Hemoglobin: 18.2 g/dL — ABNORMAL HIGH (ref 12.0–15.0)
Hemoglobin: 17.2 g/dL — ABNORMAL HIGH (ref 12.0–15.0)
MCH: 31 pg (ref 26.0–34.0)
MCH: 31 pg (ref 26.0–34.0)
MCHC: 33.1 g/dL (ref 30.0–36.0)
MCHC: 31.4 g/dL (ref 30.0–36.0)
MCV: 93.5 fL (ref 80.0–100.0)
MCV: 98.6 fL (ref 80.0–100.0)
Platelets: 230 10*3/uL (ref 150–400)
Platelets: 200 10*3/uL (ref 150–400)
RBC: 5.88 MIL/uL — ABNORMAL HIGH (ref 3.87–5.11)
RBC: 5.55 MIL/uL — ABNORMAL HIGH (ref 3.87–5.11)
RDW: 12.7 % (ref 11.5–15.5)
RDW: 13 % (ref 11.5–15.5)
WBC: 18.7 10*3/uL — ABNORMAL HIGH (ref 4.0–10.5)
WBC: 25.2 10*3/uL — ABNORMAL HIGH (ref 4.0–10.5)
nRBC: 0 % (ref 0.0–0.2)
nRBC: 0 % (ref 0.0–0.2)

## 2021-05-05 LAB — HEMOGLOBIN A1C
Hgb A1c MFr Bld: 7.1 % — ABNORMAL HIGH (ref 4.8–5.6)
Mean Plasma Glucose: 157 mg/dL

## 2021-05-05 LAB — BASIC METABOLIC PANEL
Anion gap: 14 (ref 5–15)
BUN: 15 mg/dL (ref 8–23)
CO2: 20 mmol/L — ABNORMAL LOW (ref 22–32)
Calcium: 8.5 mg/dL — ABNORMAL LOW (ref 8.9–10.3)
Chloride: 92 mmol/L — ABNORMAL LOW (ref 98–111)
Creatinine, Ser: 1.23 mg/dL — ABNORMAL HIGH (ref 0.44–1.00)
GFR, Estimated: 43 mL/min — ABNORMAL LOW (ref >60)
Glucose, Bld: 374 mg/dL — ABNORMAL HIGH (ref 70–99)
Potassium: 6.2 mmol/L — ABNORMAL HIGH (ref 3.5–5.1)
Sodium: 126 mmol/L — ABNORMAL LOW (ref 135–145)

## 2021-05-05 LAB — URINALYSIS, ROUTINE W REFLEX MICROSCOPIC
Bacteria, UA: NONE SEEN
Bilirubin Urine: NEGATIVE
Glucose, UA: >=500 mg/dL — AB
Hgb urine dipstick: NEGATIVE
Ketones, ur: 5 mg/dL — AB
Leukocytes,Ua: NEGATIVE
Nitrite: NEGATIVE
Protein, ur: NEGATIVE mg/dL
Specific Gravity, Urine: 1.016 (ref 1.005–1.030)
pH: 5 (ref 5.0–8.0)

## 2021-05-05 LAB — HEPATIC FUNCTION PANEL
ALT: <5 U/L (ref 0–44)
AST: 56 U/L — ABNORMAL HIGH (ref 15–41)
Albumin: 2.8 g/dL — ABNORMAL LOW (ref 3.5–5.0)
Alkaline Phosphatase: 70 U/L (ref 38–126)
Bilirubin, Direct: 0.8 mg/dL — ABNORMAL HIGH (ref 0.0–0.2)
Indirect Bilirubin: 1.4 mg/dL — ABNORMAL HIGH (ref 0.3–0.9)
Total Bilirubin: 2.2 mg/dL — ABNORMAL HIGH (ref 0.3–1.2)
Total Protein: 5.8 g/dL — ABNORMAL LOW (ref 6.5–8.1)

## 2021-05-05 LAB — TYPE AND SCREEN
ABO/RH(D): O POS
Antibody Screen: NEGATIVE

## 2021-05-05 LAB — RESP PANEL BY RT-PCR (FLU A&B, COVID) ARPGX2
Influenza A by PCR: NEGATIVE
Influenza B by PCR: NEGATIVE
SARS Coronavirus 2 by RT PCR: NEGATIVE

## 2021-05-05 LAB — BRAIN NATRIURETIC PEPTIDE
B Natriuretic Peptide: 48.6 pg/mL (ref 0.0–100.0)
B Natriuretic Peptide: 83.1 pg/mL (ref 0.0–100.0)

## 2021-05-05 LAB — TSH: TSH: 4.52 u[IU]/mL — ABNORMAL HIGH (ref 0.350–4.500)

## 2021-05-05 LAB — MAGNESIUM: Magnesium: 1.9 mg/dL (ref 1.7–2.4)

## 2021-05-05 LAB — CK: Total CK: 324 U/L — ABNORMAL HIGH (ref 38–234)

## 2021-05-05 MED ORDER — VANCOMYCIN HCL IN DEXTROSE 1-5 GM/200ML-% IV SOLN: 1000.0000 mg | INTRAVENOUS | Status: DC

## 2021-05-05 MED ORDER — POLYETHYLENE GLYCOL 3350 17 G PO PACK: 17.0000 g | PACK | Freq: Every day | ORAL | Status: DC | PRN

## 2021-05-05 MED ORDER — INSULIN ASPART 100 UNIT/ML IJ SOLN: 0.0000 [IU] | INTRAMUSCULAR | Status: DC

## 2021-05-05 MED ORDER — SODIUM CHLORIDE 0.9 % IV SOLN: 2.0000 g | INTRAVENOUS | Status: DC

## 2021-05-05 MED ORDER — MILRINONE LACTATE IN DEXTROSE 20-5 MG/100ML-% IV SOLN
0.2500 ug/kg/min | INTRAVENOUS | Status: DC
  Filled 2021-05-05: qty 100

## 2021-05-05 MED ORDER — CALCIUM GLUCONATE 10 % IV SOLN
1.0000 g | Freq: Once | INTRAVENOUS | Status: AC
  Administered 2021-05-05: 1 g via INTRAVENOUS
  Filled 2021-05-05: qty 10

## 2021-05-05 MED ORDER — SODIUM BICARBONATE 8.4 % IV SOLN
INTRAVENOUS | Status: AC
  Filled 2021-05-05: qty 50

## 2021-05-05 MED ORDER — SODIUM CHLORIDE 0.9 % IV BOLUS (SEPSIS)
250.0000 mL | Freq: Once | INTRAVENOUS | Status: AC
  Administered 2021-05-05: 250 mL via INTRAVENOUS

## 2021-05-05 MED ORDER — GLYCOPYRROLATE 0.2 MG/ML IJ SOLN: 0.2000 mg | INTRAMUSCULAR | Status: DC | PRN

## 2021-05-05 MED ORDER — ALBUMIN HUMAN 25 % IV SOLN
25.0000 g | Freq: Once | INTRAVENOUS | Status: AC
  Administered 2021-05-05: 25 g via INTRAVENOUS
  Filled 2021-05-05: qty 100

## 2021-05-05 MED ORDER — SODIUM CHLORIDE 0.9 % IV SOLN: INTRAVENOUS | Status: DC

## 2021-05-05 MED ORDER — DIPHENHYDRAMINE HCL 50 MG/ML IJ SOLN: 25.0000 mg | INTRAMUSCULAR | Status: DC | PRN

## 2021-05-05 MED ORDER — ACETAMINOPHEN 325 MG PO TABS: 650.0000 mg | ORAL_TABLET | Freq: Four times a day (QID) | ORAL | Status: DC | PRN

## 2021-05-05 MED ORDER — METRONIDAZOLE 500 MG/100ML IV SOLN
500.0000 mg | Freq: Once | INTRAVENOUS | Status: AC
  Administered 2021-05-05: 500 mg via INTRAVENOUS
  Filled 2021-05-05: qty 100

## 2021-05-05 MED ORDER — VANCOMYCIN HCL IN DEXTROSE 1-5 GM/200ML-% IV SOLN
1000.0000 mg | Freq: Once | INTRAVENOUS | Status: AC
  Administered 2021-05-05: 1000 mg via INTRAVENOUS
  Filled 2021-05-05: qty 200

## 2021-05-05 MED ORDER — POLYVINYL ALCOHOL 1.4 % OP SOLN
1.0000 [drp] | Freq: Four times a day (QID) | OPHTHALMIC | Status: DC | PRN
  Filled 2021-05-05: qty 15

## 2021-05-05 MED ORDER — MORPHINE SULFATE (PF) 2 MG/ML IV SOLN
2.0000 mg | INTRAVENOUS | Status: DC | PRN
  Administered 2021-05-05: 2 mg via INTRAVENOUS

## 2021-05-05 MED ORDER — DOBUTAMINE IN D5W 4-5 MG/ML-% IV SOLN
5.0000 ug/kg/min | INTRAVENOUS | Status: DC
  Filled 2021-05-05: qty 250

## 2021-05-05 MED ORDER — EPINEPHRINE HCL 5 MG/250ML IV SOLN IN NS: 0.5000 ug/min | INTRAVENOUS | Status: DC

## 2021-05-05 MED ORDER — LACTATED RINGERS IV BOLUS
1000.0000 mL | Freq: Once | INTRAVENOUS | Status: AC
  Administered 2021-05-05: 1000 mL via INTRAVENOUS

## 2021-05-05 MED ORDER — HEPARIN BOLUS VIA INFUSION
3300.0000 [IU] | Freq: Once | INTRAVENOUS | Status: DC
  Filled 2021-05-05: qty 3300

## 2021-05-05 MED ORDER — VASOPRESSIN 20 UNITS/100 ML INFUSION FOR SHOCK
0.0000 [IU]/min | INTRAVENOUS | Status: DC
  Administered 2021-05-05: 0.03 [IU]/min via INTRAVENOUS
  Filled 2021-05-05: qty 100

## 2021-05-05 MED ORDER — INSULIN ASPART 100 UNIT/ML IV SOLN
5.0000 [IU] | Freq: Once | INTRAVENOUS | Status: AC
  Administered 2021-05-05: 5 [IU] via INTRAVENOUS

## 2021-05-05 MED ORDER — SODIUM BICARBONATE 8.4 % IV SOLN
INTRAVENOUS | Status: AC
  Filled 2021-05-05: qty 100

## 2021-05-05 MED ORDER — DEXTROSE 5 % IV SOLN: INTRAVENOUS | Status: DC

## 2021-05-05 MED ORDER — EPINEPHRINE HCL 5 MG/250ML IV SOLN IN NS
INTRAVENOUS | Status: AC
  Filled 2021-05-05: qty 250

## 2021-05-05 MED ORDER — HEPARIN (PORCINE) 25000 UT/250ML-% IV SOLN
700.0000 [IU]/h | INTRAVENOUS | Status: DC
  Filled 2021-05-05: qty 250

## 2021-05-05 MED ORDER — MORPHINE SULFATE (PF) 2 MG/ML IV SOLN
2.0000 mg | INTRAVENOUS | Status: DC | PRN
  Filled 2021-05-05: qty 1

## 2021-05-05 MED ORDER — SODIUM ZIRCONIUM CYCLOSILICATE 10 G PO PACK
10.0000 g | PACK | Freq: Once | ORAL | Status: AC
  Administered 2021-05-05: 10 g via ORAL
  Filled 2021-05-05: qty 1

## 2021-05-05 MED ORDER — FUROSEMIDE 10 MG/ML IJ SOLN
40.0000 mg | Freq: Once | INTRAMUSCULAR | Status: AC
  Administered 2021-05-05: 40 mg via INTRAVENOUS
  Filled 2021-05-05: qty 4

## 2021-05-05 MED ORDER — IOHEXOL 350 MG/ML SOLN
80.0000 mL | Freq: Once | INTRAVENOUS | Status: AC | PRN
  Administered 2021-05-05: 80 mL via INTRAVENOUS

## 2021-05-05 MED ORDER — SODIUM CHLORIDE 0.9 % IV BOLUS (SEPSIS)
500.0000 mL | Freq: Once | INTRAVENOUS | Status: AC
  Administered 2021-05-05: 500 mL via INTRAVENOUS

## 2021-05-05 MED ORDER — ACETAMINOPHEN 650 MG RE SUPP: 650.0000 mg | Freq: Four times a day (QID) | RECTAL | Status: DC | PRN

## 2021-05-05 MED ORDER — EPINEPHRINE 1 MG/10ML IJ SOSY
PREFILLED_SYRINGE | INTRAMUSCULAR | Status: AC
  Filled 2021-05-05: qty 10

## 2021-05-05 MED ORDER — SODIUM CHLORIDE 0.9 % IV SOLN
2.0000 g | Freq: Once | INTRAVENOUS | Status: AC
  Administered 2021-05-05: 2 g via INTRAVENOUS
  Filled 2021-05-05: qty 2

## 2021-05-05 MED ORDER — NOREPINEPHRINE 4 MG/250ML-% IV SOLN
INTRAVENOUS | Status: AC
  Filled 2021-05-05: qty 250

## 2021-05-05 MED ORDER — STERILE WATER FOR INJECTION IV SOLN
INTRAVENOUS | Status: DC
  Filled 2021-05-05: qty 1000

## 2021-05-05 MED ORDER — DOCUSATE SODIUM 100 MG PO CAPS: 100.0000 mg | ORAL_CAPSULE | Freq: Two times a day (BID) | ORAL | Status: DC | PRN

## 2021-05-05 MED ORDER — GLYCOPYRROLATE 1 MG PO TABS: 1.0000 mg | ORAL_TABLET | ORAL | Status: DC | PRN

## 2021-05-05 MED ORDER — MORPHINE 100MG IN NS 100ML (1MG/ML) PREMIX INFUSION: 0.0000 mg/h | INTRAVENOUS | Status: DC

## 2021-05-05 MED ORDER — NOREPINEPHRINE 4 MG/250ML-% IV SOLN: 0.0000 ug/min | INTRAVENOUS | Status: DC

## 2021-05-05 MED ORDER — MORPHINE BOLUS VIA INFUSION
5.0000 mg | INTRAVENOUS | Status: DC | PRN
  Filled 2021-05-05: qty 5

## 2021-05-08 ENCOUNTER — Encounter: Payer: Self-pay | Admitting: Internal Medicine

## 2021-05-10 LAB — CULTURE, BLOOD (ROUTINE X 2)
Culture: NO GROWTH
Culture: NO GROWTH

## 2021-05-14 NOTE — ED Notes (Signed)
Lab attempting to draw blood cultures and lactic

## 2021-05-14 NOTE — Death Summary Note (Signed)
DEATH SUMMARY   Patient Details  Name: Christina [name] MRN: [id number] DOB: 09-Oct-1934  Admission/Discharge Information   Admit Date:  May 31, 2021  Date of Death: Date of Death: 05/31/2021  Time of Death: Time of Death: July 23, 1747  Length of Stay: 0  Referring Physician: [name], MD   Reason(s) for Hospitalization  Acute respiratory failure  Diagnoses  Preliminary cause of death:  Secondary Diagnoses (including complications and co-morbidities):  Principal Problem:   Acute respiratory failure with hypoxia (HCC) Syncope Acute HFrEF Pericardial effusion Cardiogenic shock AKI Lactic acidosis Leukocytosis Coagulopathy Hyperbilirubinemia Elevated transaminase level Hyperkalemia Hyponatremia Hyperglycemia AKI   Brief Hospital Course (including significant findings, care, treatment, and services provided and events leading to death)  Christina [name] is a 87 y.o. year old femalele who presented to [facility] on 05/31/2022 with reports of weakness.     The patient's son reports she lives independently, picked up her boyfriend from a SNF and drove him to a wedding in [place], [place] the week prior to admit.  She had mentioned to her son she had a sinus infection.  He reports she is completely independent of all ADL's & never complains about any health issues.    On day of presentation she called him to help her up as she fell at home.  He spoke with her on the phone and sounded normal.  When he got to her home, he had a difficult time getting her out of the floor.  He estimated she may have been on the floor for a couple of hours given her drink was at room temperature. She did not remember falling or being on the floor. When he got her to the couch, she had a couple of 5-10 second episodes of altered mental status where she had loss of consciousness - eyes rolled back into head, with fine twitching motions.  He described her as confused after the fact. EMS was activated.  On arrival, the patient was cool  to touch / afebrile.  Initial labs demonstrated CK 324, Trop 34, Lactic acid 6.3 > rose to 6.9 after 2.5l IVF, Na 126, K 6.2, Cl 92, CO2 20, glucose 374, BUN 15, Cr 1.23, albumin 2.8, AST 56, ALT <5, total bilirubin 2.2, WBC 18.7, Hgb 18.2, and platelets 230.  CXR without acute process.  Influenza and COVID negative. UA negative for infectious process but showed glucose >500.  She was treated as possible sepsis with 2.5L of IVF and developed increasing shortness of breath requiring bipap.  She had worsening mottling and lactic acid despite IVF.  EKG with ST, no acute ST changes. Pt complaining of central chest and back pain.    PCCM called for ICU admission. Upon admission to the ICU she continued to deteriorate. She quickly developed shock and was started on vasopressors. She had an arterial line and CVC placed. She remained on bipap for respiratory support. She was given bicarb supplementation due to severe acidosis. Despite aggressive management, she continued to decline. Cardiology evaluated her and confirmed cardiogenic shock. She elected to stay comfortable and was taken off vasopressors. She passed away with her son at bedside.    Pertinent Labs and Studies  Significant Diagnostic Studies CT Head Wo Contrast  Result Date: 05-31-21 CLINICAL DATA:  Mental status change, unknown cause EXAM: CT HEAD WITHOUT CONTRAST TECHNIQUE: Contiguous axial images were obtained from the base of the skull through the vertex without intravenous contrast. RADIATION DOSE REDUCTION: This exam was performed according to the departmental dose-optimization  program which includes automated exposure control, adjustment of the mA and/or kV according to patient size and/or use of iterative reconstruction technique. COMPARISON:  March 2018 FINDINGS: Brain: There is no acute intracranial hemorrhage, mass effect, or edema. Gray-white differentiation is preserved. Patchy hypoattenuation in the supratentorial white matter is  nonspecific probably reflects chronic microvascular ischemic changes. There is no extra-axial fluid collection. Ventricles and sulci are within normal limits in size and configuration. Vascular: There is atherosclerotic calcification at the skull base. Skull: Calvarium is unremarkable. Sinuses/Orbits: No acute finding. Other: None. IMPRESSION: No acute intracranial abnormality. Chronic/nonemergent findings detailed above. Electronically Signed   By: Macy Mis M.D.   On: 05/30/2021 12:51   DG Chest Portable 1 View  Result Date: 05/30/21 CLINICAL DATA:  Dyspnea. EXAM: PORTABLE CHEST 1 VIEW COMPARISON:  CT 30-May-2021.  Radiographs 2021-05-30 and 06/30/2016. FINDINGS: 1406 hours. The heart size and mediastinal contours are stable with a moderate size hiatal hernia containing enteric contrast from earlier CT. Mild pleural thickening is present at the left lung base. There is no significant pleural effusion. The lungs appear clear. The bones appear unremarkable. Telemetry leads overlie the chest. IMPRESSION: No acute cardiopulmonary process. Stable hiatal hernia and left-sided pleural thickening. Electronically Signed   By: Richardean Sale M.D.   On: 05-30-2021 14:14   DG Chest Port 1 View  Result Date: May 30, 2021 CLINICAL DATA:  Syncope EXAM: PORTABLE CHEST 1 VIEW COMPARISON:  Chest two views 08/30/2016, coronary CT 04/25/2020 FINDINGS: There is again an oval density overlying the heart corresponding to the moderate sliding hiatal hernia seen on prior CT. Cardiac silhouette and mediastinal contours are within normal limits with mild calcification again seen overlying the aortic arch. The lungs are clear. No pleural effusion or pneumothorax. Cholecystectomy clips. No significant skeletal abnormality. IMPRESSION: Chronic moderate-sized hiatal hernia.  No acute pulmonary process. Electronically Signed   By: Yvonne Kendall M.D.   On: 2021-05-30 11:23   ECHOCARDIOGRAM COMPLETE  Result Date: 2021-05-30     ECHOCARDIOGRAM REPORT   Patient Name:   Christina [name] Date of Exam: 2021-05-30 Medical Rec #:  [id number]      Height:       61.7 in Accession #:    [id number]     Weight:       122.0 lb Date of Birth:  06/18/1934       BSA:          1.544 m Patient Age:    36 years       BP:           146/106 mmHg Patient Gender: F              HR:           101 bpm. Exam Location:  Inpatient Procedure: 2D Echo, Cardiac Doppler and Color Doppler STAT ECHO Indications:    [place] respiratory distress R06.03  History:        Patient has prior history of Echocardiogram examinations, most                 recent 05/06/2020. Risk Factors:Hypertension, Diabetes and                 Dyslipidemia.  Sonographer:    Darlina Sicilian RDCS Referring Phys: [id number] [place]  1. Small underfilled LV cavity . Left ventricular ejection fraction, by estimation, is 60 to 65%. The left ventricle has normal function. The left ventricle has no regional wall motion abnormalities. There  is mild left ventricular hypertrophy. Left ventricular diastolic parameters are indeterminate.  2. Right ventricular systolic function is normal. The right ventricular size is normal.  3. There is a small pericardial effusion anterior to the RV and LV apex. ? proment epicardial fat vs complex effusion Suggest CT scan to further evaluate The IVC is not dilated and the RV also appears underfilled like the LV doubt tamponade The effusion  is new since echo done 05/06/20. a small pericardial effusion is present.  4. The mitral valve is degenerative. Trivial mitral valve regurgitation. No evidence of mitral stenosis. Moderate mitral annular calcification.  5. The aortic valve was not well visualized. There is mild calcification of the aortic valve. Aortic valve regurgitation is mild. Aortic valve sclerosis/calcification is present, without any evidence of aortic stenosis.  6. The inferior vena cava is normal in size with greater than 50% respiratory variability,  suggesting right atrial pressure of 3 mmHg. FINDINGS  Left Ventricle: Small underfilled LV cavity. Left ventricular ejection fraction, by estimation, is 60 to 65%. The left ventricle has normal function. The left ventricle has no regional wall motion abnormalities. The left ventricular internal cavity size  was small. There is mild left ventricular hypertrophy. Left ventricular diastolic parameters are indeterminate. Right Ventricle: The right ventricular size is normal. No increase in right ventricular wall thickness. Right ventricular systolic function is normal. Left Atrium: Left atrial size was normal in size. Right Atrium: Right atrial size was normal in size. Pericardium: There is a small pericardial effusion anterior to the RV and LV apex. ? proment epicardial fat vs complex effusion Suggest CT scan to further evaluate The IVC is not dilated and the RV also appears underfilled like the LV doubt tamponade The  effusion is new since echo done 05/06/20. A small pericardial effusion is present. Mitral Valve: The mitral valve is degenerative in appearance. There is moderate thickening of the mitral valve leaflet(s). There is moderate calcification of the mitral valve leaflet(s). Moderate mitral annular calcification. Trivial mitral valve regurgitation. No evidence of mitral valve stenosis. Tricuspid Valve: The tricuspid valve is normal in structure. Tricuspid valve regurgitation is not demonstrated. No evidence of tricuspid stenosis. Aortic Valve: The aortic valve was not well visualized. There is mild calcification of the aortic valve. Aortic valve regurgitation is mild. Aortic valve sclerosis/calcification is present, without any evidence of aortic stenosis. Pulmonic Valve: The pulmonic valve was normal in structure. Pulmonic valve regurgitation is not visualized. No evidence of pulmonic stenosis. Aorta: The aortic root is normal in size and structure. Venous: The inferior vena cava is normal in size with greater  than 50% respiratory variability, suggesting right atrial pressure of 3 mmHg. IAS/Shunts: No atrial level shunt detected by color flow Doppler.  LEFT VENTRICLE PLAX 2D LVIDd:         3.80 cm LVIDs:         2.70 cm LV PW:         1.00 cm LV IVS:        1.00 cm LVOT diam:     1.80 cm LV SV:         19 LV SV Index:   13 LVOT Area:     2.54 cm  RIGHT VENTRICLE TAPSE (M-mode): 1.3 cm LEFT ATRIUM             Index       RIGHT ATRIUM          Index LA diam:  2.60 cm 1.68 cm/m  RA Area:     7.15 cm LA Vol (A2C):   14.0 ml 9.06 ml/m  RA Volume:   9.99 ml  6.47 ml/m LA Vol (A4C):   14.2 ml 9.19 ml/m LA Biplane Vol: 15.0 ml 9.71 ml/m  AORTIC VALVE LVOT Vmax:   63.30 cm/s LVOT Vmean:  51.300 cm/s LVOT VTI:    0.076 m  AORTA Ao Root diam: 3.20 cm Ao Asc diam:  3.20 cm  SHUNTS Systemic VTI:  0.08 m Systemic Diam: 1.80 cm Jenkins Rouge MD Electronically signed by Jenkins Rouge MD Signature Date/Time: May 31, 2021/4:25:04 PM    Final    CT CHEST ABDOMEN PELVIS WO CONTRAST  Result Date: 05/31/21 CLINICAL DATA:  Sepsis.  Unwitnessed fall EXAM: CT CHEST, ABDOMEN AND PELVIS WITHOUT CONTRAST TECHNIQUE: Multidetector CT imaging of the chest, abdomen and pelvis was performed following the standard protocol without IV contrast. RADIATION DOSE REDUCTION: This exam was performed according to the departmental dose-optimization program which includes automated exposure control, adjustment of the mA and/or kV according to patient size and/or use of iterative reconstruction technique. COMPARISON:  CT chest 04/25/2020, chest x-ray 05/05/2020, CT abdomen 01/20/2016, CT cervical spine 09/10/2015 FINDINGS: CT CHEST FINDINGS Cardiovascular: Heart size is normal. Small pericardial effusion. Thoracic aorta is nonaneurysmal. Scattered atherosclerotic calcifications of the aorta and coronary arteries. Pulmonary trunk is nondilated. Mediastinum/Nodes: No axillary, mediastinal, or hilar lymphadenopathy. 2.0 cm complex left thyroid lobe nodule  with coarse calcifications, stable in size and appearance compared to 2017. Trachea within normal limits. Large hiatal hernia with the majority of the stomach located within the thorax. There is contrast and retained fluid in the distal esophagus. Lungs/Pleura: Minimal atelectatic changes within the dependent lung and left lower lobe adjacent to the hiatal hernia. Lungs are otherwise clear. No pleural effusion or pneumothorax. Musculoskeletal: Chronic superior endplate compression fracture of T9. Remote posterior left-sided rib fractures, healed. Advanced bilateral glenohumeral osteoarthritis. No acute bony findings. No chest wall hematoma. CT ABDOMEN PELVIS FINDINGS Hepatobiliary: No focal liver abnormality is seen. Status post cholecystectomy. No biliary dilatation. Pancreas: Slightly atrophic. No pancreatic ductal dilatation or surrounding inflammatory changes. Spleen: Normal in size without focal abnormality. Adrenals/Urinary Tract: Adrenal glands are unremarkable. Kidneys are normal, without renal calculi, focal lesion, or hydronephrosis. Bladder is unremarkable. Stomach/Bowel: Large hiatal hernia. No gastric outlet obstruction. No dilated loops of bowel. Scattered colonic diverticulosis. No focal bowel wall thickening or inflammatory changes. Vascular/Lymphatic: Aortic atherosclerosis. No enlarged abdominal or pelvic lymph nodes. Reproductive: Status post hysterectomy. No adnexal masses. Other: No free fluid. No abdominopelvic fluid collection. No pneumoperitoneum. No abdominal wall hernia. Musculoskeletal: Multilevel lumbar spondylosis. Degenerative changes of the bilateral hips. No acute bony findings. Chronic atrophy of the left gluteal musculature. IMPRESSION: 1. No acute abnormality within the chest, abdomen, or pelvis. 2. Large hiatal hernia with the majority of the stomach located within the thorax. 3. Small pericardial effusion. 4. Colonic diverticulosis without evidence of acute diverticulitis. 5.  Chronic T9 compression fracture. 6. Aortic and coronary artery atherosclerosis (ICD10-I70.0). 7. Stable 2.0 cm left thyroid lobe nodule. Stability for greater than 5 years implies benignity; no biopsy or followup indicated (ref: J Am Coll Radiol. 2015 Feb;12(2): 143-50). Electronically Signed   By: Davina Poke D.O.   On: May 31, 2021 12:59   CT ANGIO CHEST AORTA W/ & OR WO/CM & GATING ([place] ONLY)  Result Date: 2021/05/31 CLINICAL DATA:  Chest / back pain EXAM: CT ANGIOGRAPHY CHEST WITH CONTRAST TECHNIQUE: Multidetector CT imaging of  the chest was performed using the standard protocol during bolus administration of intravenous contrast. Multiplanar CT image reconstructions and MIPs were obtained to evaluate the vascular anatomy. RADIATION DOSE REDUCTION: This exam was performed according to the departmental dose-optimization program which includes automated exposure control, adjustment of the mA and/or kV according to patient size and/or use of iterative reconstruction technique. CONTRAST:  56mL OMNIPAQUE IOHEXOL 350 MG/ML SOLN COMPARISON:  Noncontrast study from earlier the same day, and previous studies. FINDINGS: Cardiovascular: Heart size normal. Small pericardial effusion. There is a large amount of contrast refluxing from the right atrium into the hepatic veins. The RV is nondilated. Satisfactory opacification of pulmonary arteries noted, and there is no evidence of pulmonary emboli. There is swirling incomplete contrast opacification of the thoracic aorta. No convincing evidence of dissection. No aneurysm. Classic 3 vessel brachiocephalic arterial origin anatomy without proximal stenosis. Visualized proximal abdominal aorta is atheromatous, nondilated Mediastinum/Nodes: Moderate hiatal hernia. There is gas distention of the esophagus. Ingested oral contrast passes to the gastric antrum. Enlarged heterogenous thyroid. No mediastinal mass or adenopathy. Lungs/Pleura: Trace pleural effusions , with  patchy subsegmental atelectasis or infiltrates in the posterior aspect of both lower lobes, right greater than left. No pneumothorax. Upper Abdomen: Cholecystectomy clips.  No acute findings. Musculoskeletal: Chronic T10 compression deformity stable since 04/25/2020. Anterior vertebral endplate spurring at multiple levels in the lower thoracic spine. Review of the MIP images confirms the above findings. IMPRESSION: 1. Negative for acute PE or thoracic aortic aneurysm. 2. Incomplete opacification of the thoracic aorta with apparent swirling of contrast material and blood, no convincing dissection. Consider repeat CT with delayed contrast bolus for more definitive evaluation. 3. Moderate hiatal hernia without obstruction. 4. Small pericardial and trace pleural effusions. 5. Incidental heterogeneous and enlarged thyroid. Consider thyroid ultrasound for further characterization; in the setting of significant comorbidities or limited life expectancy, no follow-up recommended (ref: J Am Coll Radiol. 2015 Feb;12(2): 143-50). Electronically Signed   By: Lucrezia Europe M.D.   On: May 25, 2021 16:09    Microbiology Recent Results (from the past 240 hour(s))  Resp Panel by RT-PCR (Flu A&B, Covid) Nasopharyngeal Swab     Status: None   Collection Time: May 25, 2021 10:16 AM   Specimen: Nasopharyngeal Swab; Nasopharyngeal(NP) swabs in vial transport medium  Result Value Ref Range Status   SARS Coronavirus 2 by RT PCR NEGATIVE NEGATIVE Final    Comment: (NOTE) SARS-CoV-2 target nucleic acids are NOT DETECTED.  The SARS-CoV-2 RNA is generally detectable in upper respiratory specimens during the acute phase of infection. The lowest concentration of SARS-CoV-2 viral copies this assay can detect is 138 copies/mL. A negative result does not preclude SARS-Cov-2 infection and should not be used as the sole basis for treatment or other patient management decisions. A negative result may occur with  improper specimen  collection/handling, submission of specimen other than nasopharyngeal swab, presence of viral mutation(s) within the areas targeted by this assay, and inadequate number of viral copies(<138 copies/mL). A negative result must be combined with clinical observations, patient history, and epidemiological information. The expected result is Negative.  Fact Sheet for Patients:  EntrepreneurPulse.com.au  Fact Sheet for Healthcare Providers:  IncredibleEmployment.be  This test is no t yet approved or cleared by the [place] FDA and  has been authorized for detection and/or diagnosis of SARS-CoV-2 by FDA under an Emergency Use Authorization (EUA). This EUA will remain  in effect (meaning this test can be used) for the duration of the COVID-19 declaration under  Section 564(b)(1) of the Act, 21 U.S.C.section 360bbb-3(b)(1), unless the authorization is terminated  or revoked sooner.       Influenza A by PCR NEGATIVE NEGATIVE Final   Influenza B by PCR NEGATIVE NEGATIVE Final    Comment: (NOTE) The Xpert Xpress SARS-CoV-2/FLU/RSV plus assay is intended as an aid in the diagnosis of influenza from Nasopharyngeal swab specimens and should not be used as a sole basis for treatment. Nasal washings and aspirates are unacceptable for Xpert Xpress SARS-CoV-2/FLU/RSV testing.  Fact Sheet for Patients: EntrepreneurPulse.com.au  Fact Sheet for Healthcare Providers: IncredibleEmployment.be  This test is not yet approved or cleared by the [place] FDA and has been authorized for detection and/or diagnosis of SARS-CoV-2 by FDA under an Emergency Use Authorization (EUA). This EUA will remain in effect (meaning this test can be used) for the duration of the COVID-19 declaration under Section 564(b)(1) of the Act, 21 U.S.C. section 360bbb-3(b)(1), unless the authorization is terminated or revoked.  Performed at [facility], [place] [address], [place], [place] [address]     Lab Basic Metabolic Panel: Recent Labs  Lab 27-May-2021 0955 May 27, 2021 1633  NA 126* 122*  K 6.2* 5.5*  CL 92*  --   CO2 20*  --   GLUCOSE 374*  --   BUN 15  --   CREATININE 1.23*  --   CALCIUM 8.5*  --   MG 1.9  --    Liver Function Tests: Recent Labs  Lab 05/27/2021 0955  AST 56*  ALT <5  ALKPHOS 70  BILITOT 2.2*  PROT 5.8*  ALBUMIN 2.8*   No results for input(s): LIPASE, AMYLASE in the last 168 hours. No results for input(s): AMMONIA in the last 168 hours. CBC: Recent Labs  Lab 05/27/21 0955 May 27, 2021 1457 May 27, 2021 1633  WBC 18.7* 25.2*  --   HGB 18.2* 17.2* 20.4*  HCT 55.0* 54.7* 60.0*  MCV 93.5 98.6  --   PLT 230 200  --    Cardiac Enzymes: Recent Labs  Lab May 27, 2021 1242  CKTOTAL 324*   Sepsis Labs: Recent Labs  Lab May 27, 2021 0955 2021/05/27 1040 2021/05/27 1242 05-27-21 1457 05-27-21 1607 05-27-21 1700  WBC 18.7*  --   --  25.2*  --   --   LATICACIDVEN  --  6.3* 6.9*  --  >9.0* >9.0*    Procedures/Operations  Aline CVC   Julian Hy May 27, 2021, 6:19 PM

## 2021-05-14 NOTE — Progress Notes (Addendum)
Patient arrived to unit hypotensive on BiPAP. Patient started on Levophed per CCM verbal order. Due to severe hypotension CCM gave verbal order okay to use higher dose of pressors, 3 amps bicarb given also started on epi drip via verbal orders. CCM placing Arterial line and central line emergently based on patient condition.

## 2021-05-14 NOTE — ED Triage Notes (Signed)
Patient BIB [facility] from home where she lives. Patient called son after falling and crawling to a phone to call son, unknown downtime on floor but EMS reports son estimates fall occurred either last night or this morning based on objects found in the home such as a cup of milk that had warmed to room temperature, patient states she does not think she hit her head, takes eliquis. CBG 329 with EMS, patient alert and in no apparent distress at this time.

## 2021-05-14 NOTE — ED Notes (Signed)
MD notified of patient having difficulty breathing and crackles in lungs now.  Patient is extremely tachypneic.  RT notified for Bipap

## 2021-05-14 NOTE — Sepsis Progress Note (Signed)
Bedside RN acknowledged 3rd LA due in bundle time and will obtain as pt status allows.

## 2021-05-14 NOTE — Progress Notes (Signed)
[place] for IV heparin Indication: changed by PCCM to ACS/STEM  Allergies  Allergen Reactions   Atorvastatin Other (See Comments)    Muscle pain   Codeine     itching    Patient Measurements: Height: 5' 1.75" (156.8 cm) Weight: 55.3 kg (122 lb) IBW/kg (Calculated) : 49.53 Heparin Dosing Weight: 55.3 kg  Vital Signs: Temp: 97.1 F (36.2 C) (01/23 0959) Temp Source: Rectal (01/23 0959) BP: 109/90 (01/23 1530) Pulse Rate: 119 (01/23 1544)  Labs: Recent Labs    05-19-21 0955 05/19/21 1242  HGB 18.2*  --   HCT 55.0*  --   PLT 230  --   LABPROT 16.0*  --   INR 1.3*  --   CREATININE 1.23*  --   CKTOTAL  --  324*  TROPONINIHS 23* 34*     Estimated Creatinine Clearance: 26.6 mL/min (A) (by C-G formula based on SCr of 1.23 mg/dL (H)).   Medical History: Past Medical History:  Diagnosis Date   Anemia, unspecified    Cataract    Chest pain    Coronary atherosclerosis of native coronary artery    Depressive disorder, not elsewhere classified    Diabetes mellitus    Disorder of bone and cartilage, unspecified    Diverticulosis of colon (without mention of hemorrhage)    External hemorrhoids without mention of complication    Family history of malignant neoplasm of gastrointestinal tract    H/O: hysterectomy    History of back surgery 2013    spinal stenosis 7 31 [place]   Hx of dislocation of shoulder    right   Hyperlipidemia    134 12-2013 per pt. on meds   Hypertension    Insomnia, unspecified    Lumbago    Personal history of colonic polyps    Squamous cell carcinoma of skin 12/20/2017   well diff-right upper forearm (txpbx)   Unspecified adverse effect of other drug, medicinal and biological substance(995.29)    Unspecified hypothyroidism    Unspecified vitamin D deficiency    Urinary frequency    Urinary tract infection, site not specified    Assessment: 33 YOF presenting with difficulty breathing.  No  history of anticoagulation prior to admission. Pharmacy to dose IV heparin.  H/H elevated, platelets within normal limits. Renal function is elevated from baseline.   CT angio chest completed 05/19/21, awaiting report of results in Epic. Indication for IV heparin has been changed by PCCM to ACS/STEMI and to adjust .   Goal of Therapy:  Heparin level 0.3-0.7 units/ml Monitor platelets by anticoagulation protocol: Yes   Plan:  IV heparin 3300 unit bolus x 1 Start IV heparing gtt @ 700 units/h 8 h heparin level check Daily heparin level, CBC Monitor for signs and symptoms of bleeding Follow-up for long-term anticoagulation plan  Thank you for involving pharmacy in this patient's care.  Nicole Cella, RPh Clinical Pharmacist May 19, 2021 4:04 PM  **Pharmacist phone directory can be found on [email or web address] listed under [place]**

## 2021-05-14 NOTE — Progress Notes (Signed)
Discussed her care with her son, Dr. Haroldine Laws. She is on 3 pressors with low MAPs. She wants to be comfortable. She understands she has minimal chance of surviving this acute insult. Her son understands. Code status changed to DNR.  Julian Hy, DO June 01, 2021 5:13 PM [facility]

## 2021-05-14 NOTE — Progress Notes (Signed)
Patient's time of death 63. Verified by this RN and Juanda Crumble, RN, per order. MD aware at time of death. At this time no family is planning to visit the patient and patient has been prepped and all lines/tubes removed at this time. Family aware of patient placement number.

## 2021-05-14 NOTE — Procedures (Signed)
Arterial Catheter Insertion Procedure Note  Christina [name]  [id number]  1934-09-07  Date:05-19-21  Time:4:45 PM    Provider Performing: Candee Furbish    Procedure: Insertion of Arterial Line [phone]) with US guidance ([id number])   Indication(s) Blood pressure monitoring and/or need for frequent ABGs  Consent Unable to obtain consent due to emergent nature of procedure.  Anesthesia None   Time Out Verified patient identification, verified procedure, site/side was marked, verified correct patient position, special equipment/implants available, medications/allergies/relevant history reviewed, required imaging and test results available.   Sterile Technique Maximal sterile technique including full sterile barrier drape, hand hygiene, sterile gown, sterile gloves, mask, hair covering, sterile ultrasound probe cover (if used).   Procedure Description Area of catheter insertion was cleaned with chlorhexidine and draped in sterile fashion. With real-time ultrasound guidance an arterial catheter was placed into the left radial artery.  Appropriate arterial tracings confirmed on monitor.     Complications/Tolerance None; patient tolerated the procedure well.   EBL Minimal   Specimen(s) None

## 2021-05-14 NOTE — Progress Notes (Signed)
Patient was transported to CT & back to ED room 33 on the BIPAP with no problems.

## 2021-05-14 NOTE — ED Provider Notes (Signed)
[facility] Provider Note   CSN: [id number] Arrival date & time: May 27, 2021  0946     History  Chief Complaint  Patient presents with   Loss of Consciousness    Christina [name] is a 87 y.o. female.   Loss of Consciousness Associated symptoms: diaphoresis (last night), shortness of breath and weakness (generalized)   Associated symptoms: no chest pain, no confusion, no dizziness, no fever, no headaches, no nausea, no palpitations, no seizures and no vomiting   Patient presents for a fall sometime last night and a syncopal episode this morning.  History is provided by the patient and her son.  At baseline, patient lives independently.  Her son lives close by.  She was reportedly in her normal state of health yesterday, although has had recent cough and concern of a sinus infection.  She did mention that she was feeling diaphoretic last night.  This morning, patient's son received a phone call from the patient stating that she had fell and could not get up off of the floor.  He arrived at her house 15 minutes later and found her on the floor.  He estimates that she was on the floor for several hours based on a cup of milk that was at room temperature.  Patient does not remember the circumstances surrounding the fall.  She does remember being on the floor.  She states that she has some soreness in her left shoulder but denies any other areas of pain at this time.  Patient son helped her to a chair.  Shortly thereafter, patient had a syncopal episode.  This is described by her son as her losing consciousness, eyes rolling back in her head, and some very low amplitude twitching motions.  He estimates that lasted for 6 seconds and that patient was confused afterwards.  Patient said has not aware of any syncope or seizure history.  He states that her mentation improved by the time EMS arrived.  EMS noted a blood sugar of 329.  Patient denies take any anticoagulation  medicine.  She does take Plavix.  She states that this is "for her heart".  Per chart review, medical history includes CAD, DM2, neuropathy, hypothyroidism, HLD, HTN, previous syncope, and anxiety.    Home Medications Prior to Admission medications   Medication Sig Start Date End Date Taking? Authorizing Provider  aspirin 81 MG EC tablet TAKE 1 TABLET (81 MG TOTAL) BY MOUTH DAILY. SWALLOW WHOLE. Patient taking differently: Take 81 mg by mouth daily. SWALLOW WHOLE. 05/22/20 05/22/21 Yes Burnell Blanks, MD  Chlorphen-Phenyleph-ASA (ALKA-SELTZER PLUS COLD) 2-7.8-325 MG TBEF Take 2 tablets by mouth every 6 (six) hours as needed (cold symptoms).   Yes [provider]  Cholecalciferol (VITAMIN D-3) 125 MCG (5000 UT) TABS Take 5,000 Units by mouth daily.   Yes [provider]  clopidogrel (PLAVIX) 75 MG tablet Take 1 tablet (75 mg total) by mouth daily with breakfast. 05/22/20  Yes Burnell Blanks, MD  Cyanocobalamin (VITAMIN B 12 PO) Take 5,000 Units by mouth daily.   Yes [provider]  docusate sodium (COLACE) 100 MG capsule Take 100 mg by mouth daily as needed for mild constipation.   Yes [provider]  escitalopram (LEXAPRO) 10 MG tablet Take 10 mg by mouth daily.   Yes [provider]  ezetimibe (ZETIA) 10 MG tablet Take 10 mg by mouth daily. 10/19/15  Yes [provider]  fish oil-omega-3 fatty acids 1000 MG capsule  Take 1 g by mouth daily as needed (if eat something with fat).   Yes [provider]  irbesartan (AVAPRO) 300 MG tablet Take 300 mg by mouth at bedtime. 11/04/15  Yes [provider]  levothyroxine (SYNTHROID, LEVOTHROID) 50 MCG tablet TAKE 1 TABLET BY MOUTH EVERY DAY Patient taking differently: Take 50 mcg by mouth daily.   Yes Panosh, Standley Brooking, MD  LORazepam (ATIVAN) 2 MG tablet Take 2 mg by mouth at bedtime as needed for sleep.   Yes [provider]  Multiple Vitamins-Minerals (MULTIVITAMIN  WITH MINERALS) tablet Take 1 tablet by mouth daily. Centrum Silver   Yes [provider]  rosuvastatin (CRESTOR) 10 MG tablet Take 1 tablet (10 mg total) by mouth daily. 09/23/20  Yes Chandrasekhar, Mahesh A, MD  gabapentin (NEURONTIN) 600 MG tablet Take 600 mg by mouth 3 (three) times daily. Patient not taking: Reported on May 14, 2021 05/26/20   [provider]  nitroGLYCERIN (NITROSTAT) 0.4 MG SL tablet Place 1 tablet (0.4 mg total) under the tongue every 5 (five) minutes as needed for chest pain. 03/28/12   Wall, Marijo Conception, MD      Allergies    Atorvastatin and Codeine    Review of Systems   Review of Systems  Constitutional:  Positive for diaphoresis (last night) and fatigue. Negative for activity change, appetite change and fever.  HENT:  Negative for congestion, rhinorrhea and sore throat.   Respiratory:  Positive for cough and shortness of breath. Negative for chest tightness, wheezing and stridor.   Cardiovascular:  Positive for syncope. Negative for chest pain, palpitations and leg swelling.  Gastrointestinal:  Negative for abdominal pain, diarrhea, nausea and vomiting.  Genitourinary:  Negative for dysuria, flank pain, frequency and pelvic pain.  Musculoskeletal:  Negative for arthralgias, back pain, joint swelling, myalgias and neck pain.  Skin:  Negative for rash and wound.  Neurological:  Positive for syncope and weakness (generalized). Negative for dizziness, tremors, seizures, facial asymmetry, speech difficulty, light-headedness, numbness and headaches.  Hematological:  Does not bruise/bleed easily.  Psychiatric/Behavioral:  Negative for confusion and decreased concentration.    Physical Exam Updated Vital Signs BP (!) 52/23    Pulse 87    Temp (!) 97.1 F (36.2 C) (Rectal)    Resp (!) 0    Ht 5' 1.75" (1.568 m)    Wt 55.3 kg    SpO2 98%    BMI 22.50 kg/m  Physical Exam Constitutional:      General: She is not in acute distress.    Appearance: Normal  appearance. She is not toxic-appearing or diaphoretic.  HENT:     Head: Normocephalic and atraumatic.     Right Ear: External ear normal.     Left Ear: External ear normal.     Nose: Nose normal.     Mouth/Throat:     Mouth: Mucous membranes are moist.     Pharynx: Oropharynx is clear.  Eyes:     General: No scleral icterus.    Extraocular Movements: Extraocular movements intact.  Cardiovascular:     Rate and Rhythm: Normal rate and regular rhythm.     Heart sounds: No murmur heard. Pulmonary:     Effort: No respiratory distress.     Breath sounds: No wheezing or rales.  Chest:     Chest wall: No tenderness.  Abdominal:     General: There is no distension.     Palpations: Abdomen is soft.     Tenderness: There is  no abdominal tenderness.  Musculoskeletal:        General: No swelling, tenderness or deformity. Normal range of motion.     Cervical back: Normal range of motion and neck supple. No rigidity or tenderness.     Right lower leg: No edema.     Left lower leg: No edema.  Skin:    General: Skin is cool and dry.     Capillary Refill: Capillary refill takes more than 3 seconds.     Coloration: Skin is not jaundiced or pale.  Neurological:     General: No focal deficit present.     Mental Status: She is alert and oriented to person, place, and time.     Cranial Nerves: No cranial nerve deficit.     Sensory: No sensory deficit.     Motor: No weakness.     Coordination: Coordination normal.  Psychiatric:        Mood and Affect: Mood normal.        Behavior: Behavior normal.    ED Results / Procedures / Treatments   Labs (all labs ordered are listed, but only abnormal results are displayed) Labs Reviewed  BASIC METABOLIC PANEL - Abnormal; Notable for the following components:      Result Value   Sodium 126 (*)    Potassium 6.2 (*)    Chloride 92 (*)    CO2 20 (*)    Glucose, Bld 374 (*)    Creatinine, Ser 1.23 (*)    Calcium 8.5 (*)    GFR, Estimated 43 (*)     All other components within normal limits  CBC - Abnormal; Notable for the following components:   WBC 18.7 (*)    RBC 5.88 (*)    Hemoglobin 18.2 (*)    HCT 55.0 (*)    All other components within normal limits  URINALYSIS, ROUTINE W REFLEX MICROSCOPIC - Abnormal; Notable for the following components:   Color, Urine AMBER (*)    APPearance HAZY (*)    Glucose, UA >=500 (*)    Ketones, ur 5 (*)    All other components within normal limits  PROTIME-INR - Abnormal; Notable for the following components:   Prothrombin Time 16.0 (*)    INR 1.3 (*)    All other components within normal limits  HEPATIC FUNCTION PANEL - Abnormal; Notable for the following components:   Total Protein 5.8 (*)    Albumin 2.8 (*)    AST 56 (*)    Total Bilirubin 2.2 (*)    Bilirubin, Direct 0.8 (*)    Indirect Bilirubin 1.4 (*)    All other components within normal limits  TSH - Abnormal; Notable for the following components:   TSH 4.520 (*)    All other components within normal limits  LACTIC ACID, PLASMA - Abnormal; Notable for the following components:   Lactic Acid, Venous 6.3 (*)    All other components within normal limits  LACTIC ACID, PLASMA - Abnormal; Notable for the following components:   Lactic Acid, Venous 6.9 (*)    All other components within normal limits  CK - Abnormal; Notable for the following components:   Total CK 324 (*)    All other components within normal limits  LACTIC ACID, PLASMA - Abnormal; Notable for the following components:   Lactic Acid, Venous >9.0 (*)    All other components within normal limits  LACTIC ACID, PLASMA - Abnormal; Notable for the following components:   Lactic Acid, Venous >  9.0 (*)    All other components within normal limits  CBC - Abnormal; Notable for the following components:   WBC 25.2 (*)    RBC 5.55 (*)    Hemoglobin 17.2 (*)    HCT 54.7 (*)    All other components within normal limits  PROTIME-INR - Abnormal; Notable for the following  components:   Prothrombin Time 18.2 (*)    INR 1.5 (*)    All other components within normal limits  CBG MONITORING, ED - Abnormal; Notable for the following components:   Glucose-Capillary 349 (*)    All other components within normal limits  CBG MONITORING, ED - Abnormal; Notable for the following components:   Glucose-Capillary 434 (*)    All other components within normal limits  POCT I-STAT 7, (LYTES, BLD GAS, ICA,H+H) - Abnormal; Notable for the following components:   pH, Arterial 7.052 (*)    pCO2 arterial 15.2 (*)    pO2, Arterial 184 (*)    Bicarbonate 4.2 (*)    TCO2 <5 (*)    Acid-base deficit 24.0 (*)    Sodium 122 (*)    Potassium 5.5 (*)    Calcium, Ion 1.13 (*)    HCT 60.0 (*)    Hemoglobin 20.4 (*)    All other components within normal limits  TROPONIN I (HIGH SENSITIVITY) - Abnormal; Notable for the following components:   Troponin I (High Sensitivity) 23 (*)    All other components within normal limits  TROPONIN I (HIGH SENSITIVITY) - Abnormal; Notable for the following components:   Troponin I (High Sensitivity) 34 (*)    All other components within normal limits  TROPONIN I (HIGH SENSITIVITY) - Abnormal; Notable for the following components:   Troponin I (High Sensitivity) 300 (*)    All other components within normal limits  RESP PANEL BY RT-PCR (FLU A&B, COVID) ARPGX2  CULTURE, BLOOD (ROUTINE X 2)  CULTURE, BLOOD (ROUTINE X 2)  MAGNESIUM  BRAIN NATRIURETIC PEPTIDE  BRAIN NATRIURETIC PEPTIDE  HEMOGLOBIN A1C  RAPID URINE DRUG SCREEN, HOSP PERFORMED  CBC  BASIC METABOLIC PANEL  BLOOD GAS, ARTERIAL  MAGNESIUM  PHOSPHORUS  COOXEMETRY PANEL  TYPE AND SCREEN    EKG EKG Interpretation  Date/Time:  May 27, 2021 09:56:58 EST Ventricular Rate:  80 PR Interval:  139 QRS Duration: 74 QT Interval:  495 QTC Calculation: 572 R Axis:   -48 Text Interpretation: Sinus rhythm Probable left atrial enlargement Inferior infarct, old Anterior  infarct, old Prolonged QT interval Confirmed by Godfrey Pick ([id number]) on 05-27-2021 10:42:43 AM  Radiology CT Head Wo Contrast  Result Date: 05-27-21 CLINICAL DATA:  Mental status change, unknown cause EXAM: CT HEAD WITHOUT CONTRAST TECHNIQUE: Contiguous axial images were obtained from the base of the skull through the vertex without intravenous contrast. RADIATION DOSE REDUCTION: This exam was performed according to the departmental dose-optimization program which includes automated exposure control, adjustment of the mA and/or kV according to patient size and/or use of iterative reconstruction technique. COMPARISON:  March 2018 FINDINGS: Brain: There is no acute intracranial hemorrhage, mass effect, or edema. Gray-white differentiation is preserved. Patchy hypoattenuation in the supratentorial white matter is nonspecific probably reflects chronic microvascular ischemic changes. There is no extra-axial fluid collection. Ventricles and sulci are within normal limits in size and configuration. Vascular: There is atherosclerotic calcification at the skull base. Skull: Calvarium is unremarkable. Sinuses/Orbits: No acute finding. Other: None. IMPRESSION: No acute intracranial abnormality. Chronic/nonemergent findings detailed above. Electronically Signed  By: Macy Mis M.D.   On: May 21, 2021 12:51   DG Chest Portable 1 View  Result Date: 05/21/2021 CLINICAL DATA:  Dyspnea. EXAM: PORTABLE CHEST 1 VIEW COMPARISON:  CT 05/21/2021.  Radiographs May 21, 2021 and 06/30/2016. FINDINGS: 1406 hours. The heart size and mediastinal contours are stable with a moderate size hiatal hernia containing enteric contrast from earlier CT. Mild pleural thickening is present at the left lung base. There is no significant pleural effusion. The lungs appear clear. The bones appear unremarkable. Telemetry leads overlie the chest. IMPRESSION: No acute cardiopulmonary process. Stable hiatal hernia and left-sided pleural thickening.  Electronically Signed   By: Richardean Sale M.D.   On: 05-21-21 14:14   DG Chest Port 1 View  Result Date: 05/21/21 CLINICAL DATA:  Syncope EXAM: PORTABLE CHEST 1 VIEW COMPARISON:  Chest two views 08/30/2016, coronary CT 04/25/2020 FINDINGS: There is again an oval density overlying the heart corresponding to the moderate sliding hiatal hernia seen on prior CT. Cardiac silhouette and mediastinal contours are within normal limits with mild calcification again seen overlying the aortic arch. The lungs are clear. No pleural effusion or pneumothorax. Cholecystectomy clips. No significant skeletal abnormality. IMPRESSION: Chronic moderate-sized hiatal hernia.  No acute pulmonary process. Electronically Signed   By: Yvonne Kendall M.D.   On: May 21, 2021 11:23   ECHOCARDIOGRAM COMPLETE  Result Date: 05/21/2021    ECHOCARDIOGRAM REPORT   Patient Name:   Christina [name] Date of Exam: 05/21/2021 Medical Rec #:  [id number]      Height:       61.7 in Accession #:    [id number]     Weight:       122.0 lb Date of Birth:  1934/12/06       BSA:          1.544 m Patient Age:    20 years       BP:           146/106 mmHg Patient Gender: F              HR:           101 bpm. Exam Location:  Inpatient Procedure: 2D Echo, Cardiac Doppler and Color Doppler STAT ECHO Indications:    [place] respiratory distress R06.03  History:        Patient has prior history of Echocardiogram examinations, most                 recent 05/06/2020. Risk Factors:Hypertension, Diabetes and                 Dyslipidemia.  Sonographer:    Darlina Sicilian RDCS Referring Phys: [id number] [place]  1. Small underfilled LV cavity . Left ventricular ejection fraction, by estimation, is 60 to 65%. The left ventricle has normal function. The left ventricle has no regional wall motion abnormalities. There is mild left ventricular hypertrophy. Left ventricular diastolic parameters are indeterminate.  2. Right ventricular systolic function is normal. The  right ventricular size is normal.  3. There is a small pericardial effusion anterior to the RV and LV apex. ? proment epicardial fat vs complex effusion Suggest CT scan to further evaluate The IVC is not dilated and the RV also appears underfilled like the LV doubt tamponade The effusion  is new since echo done 05/06/20. a small pericardial effusion is present.  4. The mitral valve is degenerative. Trivial mitral valve regurgitation. No evidence of mitral stenosis. Moderate mitral annular calcification.  5.  The aortic valve was not well visualized. There is mild calcification of the aortic valve. Aortic valve regurgitation is mild. Aortic valve sclerosis/calcification is present, without any evidence of aortic stenosis.  6. The inferior vena cava is normal in size with greater than 50% respiratory variability, suggesting right atrial pressure of 3 mmHg. FINDINGS  Left Ventricle: Small underfilled LV cavity. Left ventricular ejection fraction, by estimation, is 60 to 65%. The left ventricle has normal function. The left ventricle has no regional wall motion abnormalities. The left ventricular internal cavity size  was small. There is mild left ventricular hypertrophy. Left ventricular diastolic parameters are indeterminate. Right Ventricle: The right ventricular size is normal. No increase in right ventricular wall thickness. Right ventricular systolic function is normal. Left Atrium: Left atrial size was normal in size. Right Atrium: Right atrial size was normal in size. Pericardium: There is a small pericardial effusion anterior to the RV and LV apex. ? proment epicardial fat vs complex effusion Suggest CT scan to further evaluate The IVC is not dilated and the RV also appears underfilled like the LV doubt tamponade The  effusion is new since echo done 05/06/20. A small pericardial effusion is present. Mitral Valve: The mitral valve is degenerative in appearance. There is moderate thickening of the mitral valve  leaflet(s). There is moderate calcification of the mitral valve leaflet(s). Moderate mitral annular calcification. Trivial mitral valve regurgitation. No evidence of mitral valve stenosis. Tricuspid Valve: The tricuspid valve is normal in structure. Tricuspid valve regurgitation is not demonstrated. No evidence of tricuspid stenosis. Aortic Valve: The aortic valve was not well visualized. There is mild calcification of the aortic valve. Aortic valve regurgitation is mild. Aortic valve sclerosis/calcification is present, without any evidence of aortic stenosis. Pulmonic Valve: The pulmonic valve was normal in structure. Pulmonic valve regurgitation is not visualized. No evidence of pulmonic stenosis. Aorta: The aortic root is normal in size and structure. Venous: The inferior vena cava is normal in size with greater than 50% respiratory variability, suggesting right atrial pressure of 3 mmHg. IAS/Shunts: No atrial level shunt detected by color flow Doppler.  LEFT VENTRICLE PLAX 2D LVIDd:         3.80 cm LVIDs:         2.70 cm LV PW:         1.00 cm LV IVS:        1.00 cm LVOT diam:     1.80 cm LV SV:         19 LV SV Index:   13 LVOT Area:     2.54 cm  RIGHT VENTRICLE TAPSE (M-mode): 1.3 cm LEFT ATRIUM             Index       RIGHT ATRIUM          Index LA diam:        2.60 cm 1.68 cm/m  RA Area:     7.15 cm LA Vol (A2C):   14.0 ml 9.06 ml/m  RA Volume:   9.99 ml  6.47 ml/m LA Vol (A4C):   14.2 ml 9.19 ml/m LA Biplane Vol: 15.0 ml 9.71 ml/m  AORTIC VALVE LVOT Vmax:   63.30 cm/s LVOT Vmean:  51.300 cm/s LVOT VTI:    0.076 m  AORTA Ao Root diam: 3.20 cm Ao Asc diam:  3.20 cm  SHUNTS Systemic VTI:  0.08 m Systemic Diam: 1.80 cm Jenkins Rouge MD Electronically signed by Jenkins Rouge MD Signature Date/Time: 06/03/21/4:25:04 PM  Final    CT CHEST ABDOMEN PELVIS WO CONTRAST  Result Date: 05/26/21 CLINICAL DATA:  Sepsis.  Unwitnessed fall EXAM: CT CHEST, ABDOMEN AND PELVIS WITHOUT CONTRAST TECHNIQUE:  Multidetector CT imaging of the chest, abdomen and pelvis was performed following the standard protocol without IV contrast. RADIATION DOSE REDUCTION: This exam was performed according to the departmental dose-optimization program which includes automated exposure control, adjustment of the mA and/or kV according to patient size and/or use of iterative reconstruction technique. COMPARISON:  CT chest 04/25/2020, chest x-ray 05/05/2020, CT abdomen 01/20/2016, CT cervical spine 09/10/2015 FINDINGS: CT CHEST FINDINGS Cardiovascular: Heart size is normal. Small pericardial effusion. Thoracic aorta is nonaneurysmal. Scattered atherosclerotic calcifications of the aorta and coronary arteries. Pulmonary trunk is nondilated. Mediastinum/Nodes: No axillary, mediastinal, or hilar lymphadenopathy. 2.0 cm complex left thyroid lobe nodule with coarse calcifications, stable in size and appearance compared to 2017. Trachea within normal limits. Large hiatal hernia with the majority of the stomach located within the thorax. There is contrast and retained fluid in the distal esophagus. Lungs/Pleura: Minimal atelectatic changes within the dependent lung and left lower lobe adjacent to the hiatal hernia. Lungs are otherwise clear. No pleural effusion or pneumothorax. Musculoskeletal: Chronic superior endplate compression fracture of T9. Remote posterior left-sided rib fractures, healed. Advanced bilateral glenohumeral osteoarthritis. No acute bony findings. No chest wall hematoma. CT ABDOMEN PELVIS FINDINGS Hepatobiliary: No focal liver abnormality is seen. Status post cholecystectomy. No biliary dilatation. Pancreas: Slightly atrophic. No pancreatic ductal dilatation or surrounding inflammatory changes. Spleen: Normal in size without focal abnormality. Adrenals/Urinary Tract: Adrenal glands are unremarkable. Kidneys are normal, without renal calculi, focal lesion, or hydronephrosis. Bladder is unremarkable. Stomach/Bowel: Large hiatal  hernia. No gastric outlet obstruction. No dilated loops of bowel. Scattered colonic diverticulosis. No focal bowel wall thickening or inflammatory changes. Vascular/Lymphatic: Aortic atherosclerosis. No enlarged abdominal or pelvic lymph nodes. Reproductive: Status post hysterectomy. No adnexal masses. Other: No free fluid. No abdominopelvic fluid collection. No pneumoperitoneum. No abdominal wall hernia. Musculoskeletal: Multilevel lumbar spondylosis. Degenerative changes of the bilateral hips. No acute bony findings. Chronic atrophy of the left gluteal musculature. IMPRESSION: 1. No acute abnormality within the chest, abdomen, or pelvis. 2. Large hiatal hernia with the majority of the stomach located within the thorax. 3. Small pericardial effusion. 4. Colonic diverticulosis without evidence of acute diverticulitis. 5. Chronic T9 compression fracture. 6. Aortic and coronary artery atherosclerosis (ICD10-I70.0). 7. Stable 2.0 cm left thyroid lobe nodule. Stability for greater than 5 years implies benignity; no biopsy or followup indicated (ref: J Am Coll Radiol. 2015 Feb;12(2): 143-50). Electronically Signed   By: Davina Poke D.O.   On: 05-26-2021 12:59   CT ANGIO CHEST AORTA W/ & OR WO/CM & GATING ( ONLY)  Result Date: 26-May-2021 CLINICAL DATA:  Chest / back pain EXAM: CT ANGIOGRAPHY CHEST WITH CONTRAST TECHNIQUE: Multidetector CT imaging of the chest was performed using the standard protocol during bolus administration of intravenous contrast. Multiplanar CT image reconstructions and MIPs were obtained to evaluate the vascular anatomy. RADIATION DOSE REDUCTION: This exam was performed according to the departmental dose-optimization program which includes automated exposure control, adjustment of the mA and/or kV according to patient size and/or use of iterative reconstruction technique. CONTRAST:  76mL OMNIPAQUE IOHEXOL 350 MG/ML SOLN COMPARISON:  Noncontrast study from earlier the same day,  and previous studies. FINDINGS: Cardiovascular: Heart size normal. Small pericardial effusion. There is a large amount of contrast refluxing from the right atrium into the hepatic veins. The RV is  nondilated. Satisfactory opacification of pulmonary arteries noted, and there is no evidence of pulmonary emboli. There is swirling incomplete contrast opacification of the thoracic aorta. No convincing evidence of dissection. No aneurysm. Classic 3 vessel brachiocephalic arterial origin anatomy without proximal stenosis. Visualized proximal abdominal aorta is atheromatous, nondilated Mediastinum/Nodes: Moderate hiatal hernia. There is gas distention of the esophagus. Ingested oral contrast passes to the gastric antrum. Enlarged heterogenous thyroid. No mediastinal mass or adenopathy. Lungs/Pleura: Trace pleural effusions , with patchy subsegmental atelectasis or infiltrates in the posterior aspect of both lower lobes, right greater than left. No pneumothorax. Upper Abdomen: Cholecystectomy clips.  No acute findings. Musculoskeletal: Chronic T10 compression deformity stable since 04/25/2020. Anterior vertebral endplate spurring at multiple levels in the lower thoracic spine. Review of the MIP images confirms the above findings. IMPRESSION: 1. Negative for acute PE or thoracic aortic aneurysm. 2. Incomplete opacification of the thoracic aorta with apparent swirling of contrast material and blood, no convincing dissection. Consider repeat CT with delayed contrast bolus for more definitive evaluation. 3. Moderate hiatal hernia without obstruction. 4. Small pericardial and trace pleural effusions. 5. Incidental heterogeneous and enlarged thyroid. Consider thyroid ultrasound for further characterization; in the setting of significant comorbidities or limited life expectancy, no follow-up recommended (ref: J Am Coll Radiol. 2015 Feb;12(2): 143-50). Electronically Signed   By: Lucrezia Europe M.D.   On: May 14, 2021 16:09     Procedures Procedures    Medications Ordered in ED Medications  0.9 %  sodium chloride infusion (0 mLs Intravenous Stopped 2021/05/14 1400)  ceFEPIme (MAXIPIME) 2 g in sodium chloride 0.9 % 100 mL IVPB (has no administration in time range)  vancomycin (VANCOCIN) IVPB 1000 mg/200 mL premix (has no administration in time range)  docusate sodium (COLACE) capsule 100 mg (has no administration in time range)  polyethylene glycol (MIRALAX / GLYCOLAX) packet 17 g (has no administration in time range)  insulin aspart (novoLOG) injection 0-9 Units (has no administration in time range)  norepinephrine (LEVOPHED) 4-5 MG/250ML-% infusion SOLN (has no administration in time range)  EPINEPHrine NaCl 5-0.9 MG/250ML-% premix infusion (has no administration in time range)  sodium bicarbonate 150 mEq in sterile water 1,150 mL infusion ( Intravenous New Bag/Given 05-14-21 1701)  vasopressin (PITRESSIN) 20 Units in sodium chloride 0.9 % 100 mL infusion-*FOR SHOCK* (0.03 Units/min Intravenous New Bag/Given 05-14-21 1705)  EPINEPHrine (ADRENALIN) 5 mg in NS 250 mL (0.02 mg/mL) premix infusion (has no administration in time range)  norepinephrine (LEVOPHED) 4mg  in 247mL (0.016 mg/mL) premix infusion (has no administration in time range)  DOBUTamine (DOBUTREX) infusion 4000 mcg/mL (has no administration in time range)  morphine 2 MG/ML injection 2 mg (has no administration in time range)  morphine 2 MG/ML injection 2 mg (2 mg Intravenous Given 2021-05-14 1720)  morphine bolus via infusion 5 mg (has no administration in time range)  morphine 100mg  in NS 118mL (1mg /mL) infusion - premix (has no administration in time range)  dextrose 5 % solution (has no administration in time range)  acetaminophen (TYLENOL) tablet 650 mg (has no administration in time range)    Or  acetaminophen (TYLENOL) suppository 650 mg (has no administration in time range)  diphenhydrAMINE (BENADRYL) injection 25 mg (has no administration in time  range)  glycopyrrolate (ROBINUL) tablet 1 mg (has no administration in time range)    Or  glycopyrrolate (ROBINUL) injection 0.2 mg (has no administration in time range)    Or  glycopyrrolate (ROBINUL) injection 0.2 mg (has no administration in time range)  polyvinyl alcohol (LIQUIFILM TEARS) 1.4 % ophthalmic solution 1 drop (has no administration in time range)  lactated ringers bolus 1,000 mL (0 mLs Intravenous Stopped June 01, 2021 1137)  sodium zirconium cyclosilicate (LOKELMA) packet 10 g (10 g Oral Given 06-01-21 1136)  calcium gluconate inj 10% (1 g) URGENT USE ONLY! (1 g Intravenous Given 06/01/2021 1137)  insulin aspart (novoLOG) injection 5 Units (5 Units Intravenous Given 06/01/2021 1137)  sodium chloride 0.9 % bolus 500 mL (0 mLs Intravenous Stopped June 01, 2021 1244)    And  sodium chloride 0.9 % bolus 250 mL (0 mLs Intravenous Stopped 2021/06/01 1244)  ceFEPIme (MAXIPIME) 2 g in sodium chloride 0.9 % 100 mL IVPB (0 g Intravenous Stopped Jun 01, 2021 1216)  metroNIDAZOLE (FLAGYL) IVPB 500 mg (0 mg Intravenous Stopped 2021/06/01 1306)  vancomycin (VANCOCIN) IVPB 1000 mg/200 mL premix (0 mg Intravenous Stopped 2021-06-01 1437)  furosemide (LASIX) injection 40 mg (40 mg Intravenous Given 06/01/21 1404)  iohexol (OMNIPAQUE) 350 MG/ML injection 80 mL (80 mLs Intravenous Contrast Given 2021-06-01 1521)  sodium bicarbonate 1 mEq/mL injection (  Given 06-01-2021 1645)  sodium bicarbonate 1 mEq/mL injection (  Given 2021-06-01 1645)  albumin human 25 % solution 25 g (25 g Intravenous New Bag/Given 01-Jun-2021 1657)  sodium bicarbonate 1 mEq/mL injection (  Given 06-01-21 1715)    ED Course/ Medical Decision Making/ A&P                           Medical Decision Making Amount and/or Complexity of Data Reviewed Labs: ordered. Radiology: ordered.  Risk OTC drugs. Prescription drug management. Decision regarding hospitalization.   This patient presents to the ED for concern of fall, generalized weakness, and syncopal  episode, this involves an extensive number of treatment options, and is a complaint that carries with it a high risk of complications and morbidity.  The differential diagnosis includes cardiac etiology, sepsis, dehydration, CVA, PE   Co morbidities that complicate the patient evaluation  CAD, DM2, neuropathy, hypothyroidism, HLD, HTN, previous syncope, and anxiety   Additional history obtained:  Additional history obtained from EMS, patient's son External records from outside source obtained and reviewed including EMR   Lab Tests:  I Ordered, and personally interpreted labs.  The pertinent results include: Initial lab work showed AKI with hyponatremia and hyperkalemia.  Patient was subsequently found to have a leukocytosis of 18.7 and a lactic acidosis of 6.3.  Hemoglobin was 18.2, consistent with hemoconcentration.  Troponin and liver enzymes were only slightly abnormal.   Imaging Studies ordered:  I ordered imaging studies including chest x-ray, noncontrasted CT scan of head, and noncontrasted scan of chest, abdomen, and pelvis. I independently visualized and interpreted imaging which showed large hiatal hernia, small pericardial effusion, no clear source of infection, no acute intracranial abnormalities I agree with the radiologist interpretation   Cardiac Monitoring:  The patient was maintained on a cardiac monitor.  I personally viewed and interpreted the cardiac monitored which showed an underlying rhythm of: Sinus rhythm   Medicines ordered and prescription drug management:  I ordered medication including IV fluids for AKI and dehydration; insulin and Lokelma for temporizing hyperkalemia; broad-spectrum antibiotics for suspected sepsis; Lasix for worsening shortness of breath Reevaluation of the patient after these medicines showed that the patient worsened I have reviewed the patients home medicines and have made adjustments as needed   Test Considered:  Contrasted CT  scans, deferred due to AKI   Critical Interventions:  IV fluids  for AKI, temporizing medications for hyperkalemia, broad-spectrum antibiotics for suspicion of sepsis, BiPAP for respiratory distress, admission to ICU for critical condition   Consultations Obtained:  I requested consultation with the critical care,  and discussed lab and imaging findings as well as pertinent plan - they recommend: Admission to ICU   Problem List / ED Course:  Patient is 87 year old female who lives independently at home and presents to the ED following a unwitnessed fall at home followed by generalized weakness and inability to get to her feet.  Her son arrived and witnessed a short syncopal episode.  On arrival in the ED, she endorses generalized weakness and fatigue.  She is alert and oriented.  Patient was placed on bedside cardiac monitor.  She was normotensive with normal heart rate.  EKG was obtained which showed sinus rhythm with nonspecific T wave flattening and QT prolongation.  On exam, she is cool in her extremities.  Rectal temperature was 97.1 degrees.  She is saturating well on room air.  Initial lab work was obtained and results of BMP showed AKI with hyponatremia and hyperkalemia.  CBC showed a leukocytosis of 18.7.  This is likely secondary, in part, to hemoconcentration, given a hemoglobin of 18.2 which is far greater than her baseline.  IV fluids were given.  Patient was given medications for temporization of hyperkalemia.  Lactic acid came back markedly elevated at 6.3.  Given her hypothermia, leukocytosis, and lactic acidosis, there is concern of sepsis.  Broad-spectrum antibiotics were initiated.  Additional IV fluids were ordered.  At this time, there is no clear source of infection.  CT scan of chest, abdomen, and pelvis was ordered to assess for occult pneumonia or intra-abdominal etiology.  Given her AKI, noncontrasted study was ordered.  Results of CT scan showed a large hiatal hernia and a  small pericardial effusion, but no source of infection.  Urinalysis was also negative for evidence of infection.  There is concern for cardiac etiology of her presentation given her syncope, cool extremities, and lactic acidosis.  Patient underwent a cardiac stent 1 year ago.  At that time, she had an echocardiogram which showed normal LVEF and only mild impaired relaxation.  Today, troponin was only slightly elevated.  Hepatobiliary enzymes were slightly abnormal but not consistent with cardiac hepatopathy.  What was concerning was her increasing tachypnea and increased work of breathing that occurred while she was in the ED.  This led to concern of iatrogenic pulmonary edema due to IV fluids in the setting of decreased heart function.  40 mg dose of IV Lasix was given.  Patient had become tachycardic and tachypneic but remained normotensive.  BiPAP was initiated.  Repeat chest x-ray was ordered which did not show pulmonary edema.  I did perform a bedside ultrasound which did not show any significant apical B-lines.  Cardiac function, however, does appear to be severely diminished.  Patient has a thickened LV wall with both impaired relaxation and impaired EF.  This would represent a significant change from her prior echocardiogram 1 year ago and is consistent with a cardiogenic component of her presentation in the ED today.  Pericardial effusion was visualized and appears to be small without evidence of tamponade.  Despite IV fluids, repeat lactic acid showed increase to 6.9.  Critical care was consulted for ICU admission.  Patient and son were updated.  I did have CODE STATUS discussion with the patient who states that she would not want chest compressions but would want intubation  for reversible causes of respiratory failure.  She remained on BiPAP.  She had diaphoresis and mottling of skin.  She was admitted to critical care for ongoing management.   Reevaluation:  After the interventions noted above, I  reevaluated the patient and found that they have :worsened   Social Determinants of Health:  Patient lives independently.  She has family support through her son.  She has multiple chronic illnesses and is acutely critically ill at this time.   Dispostion:  After consideration of the diagnostic results and the patients response to treatment, I feel that the patent would benefit from admission to ICU.   CRITICAL CARE Performed by: Godfrey Pick   Total critical care time: 50 minutes  Critical care time was exclusive of separately billable procedures and treating other patients.  Critical care was necessary to treat or prevent imminent or life-threatening deterioration.  Critical care was time spent personally by me on the following activities: development of treatment plan with patient and/or surrogate as well as nursing, discussions with consultants, evaluation of patient's response to treatment, examination of patient, obtaining history from patient or surrogate, ordering and performing treatments and interventions, ordering and review of laboratory studies, ordering and review of radiographic studies, pulse oximetry and re-evaluation of patient's condition.         Final Clinical Impression(s) / ED Diagnoses Final diagnoses:  Syncope and collapse  Lactic acidosis  Respiratory distress  Hyperkalemia  AKI (acute kidney injury) [facility])    Rx / DC Orders ED Discharge Orders     None         Godfrey Pick, MD 05-08-21 2156

## 2021-05-14 NOTE — Progress Notes (Signed)
Pharmacy Antibiotic Note  Christina [name] is a 87 y.o. female admitted on 2021/05/18 with sepsis.  Pharmacy has been consulted for vancomycin and cefepime dosing.  S/p fall at home w/ syncopal episode after. WBC elevated at 18.7, LA 6.3, temp 36.2. Scr 1.23 (CrCl 26 mL/min).   Plan: Vancomycin 1g IV every 48 hours (estAUC 488) Cefepime 2g IV every 24 hours Monitor renal fx, cx results, clinical pic, and vanc levels as appropriate   Height: 5' 1.75" (156.8 cm) Weight: 55.3 kg (122 lb) IBW/kg (Calculated) : 49.53  Temp (24hrs), Avg:97.1 F (36.2 C), Min:97.1 F (36.2 C), Max:97.1 F (36.2 C)  Recent Labs  Lab 18-May-2021 0955 May 18, 2021 1040  WBC 18.7*  --   CREATININE 1.23*  --   LATICACIDVEN  --  6.3*    Estimated Creatinine Clearance: 26.6 mL/min (A) (by C-G formula based on SCr of 1.23 mg/dL (H)).    Allergies  Allergen Reactions   Atorvastatin Other (See Comments)    Muscle pain   Codeine     itching    Antimicrobials this admission: Vancomycin 1/23 >>  Cefepime 1/23 >>  Metronidazole 1/23>>  Dose adjustments this admission: N/A  Microbiology results: 1/23 BCx: sent 1/23 COVID/Flu PCR: neg   Thank you for allowing pharmacy to be a part of this patients care.  Antonietta Jewel, PharmD, [facility] Clinical Pharmacist  Phone: [phone] 05-18-2021 11:55 AM  Please check AMION for all [facility] phone numbers After 10:00 PM, call [place] [phone]

## 2021-05-14 NOTE — Procedures (Signed)
Central Venous Catheter Insertion Procedure Note  KASAUNDRA FAHRNEY  [id number]  March 16, 1935  Date:05-28-21  Time:4:45 PM   Provider Performing:Ahniyah Giancola Loletha Grayer Tamala Julian   Procedure: Insertion of Non-tunneled Central Venous Catheter(36556) with US guidance (07622)   Indication(s) Medication administration  Consent Unable to obtain consent due to emergent nature of procedure.  Anesthesia Topical only with 1% lidocaine   Timeout Verified patient identification, verified procedure, site/side was marked, verified correct patient position, special equipment/implants available, medications/allergies/relevant history reviewed, required imaging and test results available.  Sterile Technique Maximal sterile technique including full sterile barrier drape, hand hygiene, sterile gown, sterile gloves, mask, hair covering, sterile ultrasound probe cover (if used).  Procedure Description Area of catheter insertion was cleaned with chlorhexidine and draped in sterile fashion.  With real-time ultrasound guidance a central venous catheter was placed into the left femoral vein. Nonpulsatile blood flow and easy flushing noted in all ports.  The catheter was sutured in place and sterile dressing applied.  Complications/Tolerance None; patient tolerated the procedure well. Chest X-ray is ordered to verify placement for internal jugular or subclavian cannulation.   Chest x-ray is not ordered for femoral cannulation.  EBL Minimal  Specimen(s) None

## 2021-05-14 NOTE — Sepsis Progress Note (Signed)
eLink monitoring code sepsis.  

## 2021-05-14 NOTE — Progress Notes (Signed)
Patient seen and examined in the ED. Unstable, mottled, clammy, still mentating well, SBP >100. C/o CP and back pain. Developed respiratory distress after sepsis fluids. Possibly delayed femoral pulses, thready radials. Sent for STAT CTA to evaluate for dissection, possibly PE. No ischemic changes on earlier EKGs; old infarcts present.  CTA with small pericardial effusion, no dissection, no PE, no significant pulmonary infiltrates. D/w radiology, not stable for repeat CT scan to look again at her aorta.  BMP, CBC, INR, LA type & screen ordered. Remained on BiPAP, still oriented.   Aline, CVC placed in ICU by Dr. Tamala Julian-- ABG with profound metabolic acidosis, started on pressors. 2 amps of bicarb. Echo read as not having tamponade, preserved LVEF.  Starting bicarb gtt Cardiology consult regarding effusion Albumin since RV, LV underfilled Holding heparin due to concern for bleeding.  Full consult note to follow.  Julian Hy, DO 05/22/2021 4:48 PM [facility]

## 2021-05-14 NOTE — ED Notes (Signed)
Notified MD of patient becoming extremely diaphoretic and restless with full body mottling.  PCCM came to bedside and ordered CT.  Patient transported to CT with RT on Bipap.  Patient continued to have difficulty breathing and diaphoresis.  Patient brought back to room and blood drawn per orders.  Was going to hang heparin ordered but Jerald Kief MD requested waiting until full read of CT.  Patient transported to [facility]M

## 2021-05-14 NOTE — Progress Notes (Signed)
ANTICOAGULATION CONSULT NOTE - Initial Consult  Pharmacy Consult for IV heparin Indication: pulmonary embolus  Allergies  Allergen Reactions   Atorvastatin Other (See Comments)    Muscle pain   Codeine     itching    Patient Measurements: Height: 5' 1.75" (156.8 cm) Weight: 55.3 kg (122 lb) IBW/kg (Calculated) : 49.53 Heparin Dosing Weight: 55.3 kg  Vital Signs: Temp: 97.1 F (36.2 C) (01/23 0959) Temp Source: Rectal (01/23 0959) BP: 132/95 (01/23 1350) Pulse Rate: 103 (01/23 1350)  Labs: Recent Labs    2021/05/24 0955 05/24/2021 1242  HGB 18.2*  --   HCT 55.0*  --   PLT 230  --   LABPROT 16.0*  --   INR 1.3*  --   CREATININE 1.23*  --   CKTOTAL  --  324*  TROPONINIHS 23* 34*    Estimated Creatinine Clearance: 26.6 mL/min (A) (by C-G formula based on SCr of 1.23 mg/dL (H)).   Medical History: Past Medical History:  Diagnosis Date   Anemia, unspecified    Cataract    Chest pain    Coronary atherosclerosis of native coronary artery    Depressive disorder, not elsewhere classified    Diabetes mellitus    Disorder of bone and cartilage, unspecified    Diverticulosis of colon (without mention of hemorrhage)    External hemorrhoids without mention of complication    Family history of malignant neoplasm of gastrointestinal tract    H/O: hysterectomy    History of back surgery 2013    spinal stenosis 7 31 [place]   Hx of dislocation of shoulder    right   Hyperlipidemia    134 12-2013 per pt. on meds   Hypertension    Insomnia, unspecified    Lumbago    Personal history of colonic polyps    Squamous cell carcinoma of skin 12/20/2017   well diff-right upper forearm (txpbx)   Unspecified adverse effect of other drug, medicinal and biological substance(995.29)    Unspecified hypothyroidism    Unspecified vitamin D deficiency    Urinary frequency    Urinary tract infection, site not specified    Assessment: 54 YOF presenting with difficulty breathing.  CT positive for PE. No history of anticoagulation prior to admission. Pharmacy to dose IV heparin.  H/H elevated, platelets within normal limits. Renal function is elevated from baseline.   Goal of Therapy:  Heparin level 0.3-0.7 units/ml Monitor platelets by anticoagulation protocol: Yes   Plan:  IV heparin 3300 unit bolus x 1 Start IV heparing gtt @ 950 units/h 8 h heparin level check Daily heparin level, CBC Monitor for signs and symptoms of bleeding Follow-up for long-term anticoagulation plan  Thank you for involving pharmacy in this patient's care.  Elita Quick, PharmD PGY1 Ambulatory Care Pharmacy Resident 24-May-2021 3:31 PM  **Pharmacist phone directory can be found on [email or web address] listed under [facility]**

## 2021-05-14 NOTE — Progress Notes (Signed)
Patient was transported from ED to room 2M04 on Bipap with no problems.

## 2021-05-14 NOTE — H&P (Signed)
NAME:  Christina [name], MRN:  [id number], DOB:  07/13/1934, LOS: 0 ADMISSION DATE:  05/12/21, CONSULTATION DATE:  2021/05/12 REFERRING MD:  Dr. Doren Custard, CHIEF COMPLAINT:  Weakness    History of Present Illness:  87 y/o F who presented to [facility] on 1/23 with reports of weakness.    The patient's son reports she lives independently, picked up her boyfriend from a SNF and drove him to a wedding in [place], [place] the week prior to admit.  She had mentioned to her son she had a sinus infection.  He reports she is completely independent of all ADL's & never complains about any health issues.   On day of presentation she called him to help her up as she fell at home.  He spoke with her on the phone and sounded normal.  When he got to her home, he had a difficult time getting her out of the floor.  He estimated she may have been on the floor for a couple of hours given her drink was at room temperature. She did not remember falling or being on the floor. When he got her to the couch, she had a couple of 5-10 second episodes of altered mental status where she had loss of consciousness - eyes rolled back into head, with fine twitching motions.  He described her as confused after the fact. EMS was activated.  On arrival, the patient was cool to touch / afebrile.  Initial labs demonstrated CK 324, Trop 34, Lactic acid 6.3 > rose to 6.9 after 2.5l IVF, Na 126, K 6.2, Cl 92, CO2 20, glucose 374, BUN 15, Cr 1.23, albumin 2.8, AST 56, ALT <5, total bilirubin 2.2, WBC 18.7, Hgb 18.2, and platelets 230.  CXR without acute process.  Influenza and COVID negative. UA negative for infectious process but showed glucose >500.  She was treated as possible sepsis with 2.5L of IVF and developed increasing shortness of breath requiring bipap.  She had worsening mottling and lactic acid despite IVF.  EKG with ST, no acute ST changes. Pt complaining of central chest and back pain.   PCCM called for ICU admission.   Pertinent  Medical  History  Anemia  CAD HTN  HLD  Hypothyroidism  Diverticulosis Spinal Stenosis s/p Surgery  Appendectomy  Cholecystectomy   Significant Hospital Events: Including procedures, antibiotic start and stop dates in addition to other pertinent events   1/23 Admit with CC of weakness  Interim History / Subjective:  As above   Objective   Blood pressure (!) 132/95, pulse (!) 103, temperature (!) 97.1 F (36.2 C), temperature source Rectal, resp. rate (!) 38, height 5' 1.75" (1.568 m), weight 55.3 kg, SpO2 95 %.        Intake/Output Summary (Last 24 hours) at 05-12-21 1434 Last data filed at 2021-05-12 1400 Gross per 24 hour  Intake 2853.24 ml  Output --  Net 2853.24 ml   Filed Weights   05/12/21 1001  Weight: 55.3 kg    Examination: General: critically ill appearing elderly adult female lying in bed on BiPAP HENT: BiPAP mask in place, anicteric, mm pink / dry Lungs: non-labored at rest but tachypneic, lungs bilaterally with crackles  Cardiovascular: S1S2 RRR, ?muffled heart tones, narrow pulse pressure noted on monitor, radial pulses weak but palpable Abdomen: soft / non-tender  Extremities: cold to touch, moist, mottled skin  Neuro: Awake, alert, attempts to communicate, appropriate / MAE GU: pure wick in place   Resolved Hospital Problem list  Assessment & Plan:   Shock  Weakness/Fatigue Differential includes cardiogenic with effusion, r/o tamponade, possible evolving MI, pulmonary embolism, aortic dissection and infection though no clear source. Lactate not cleared with IVF. COVID, influenza negative. S/p empiric abx in ER with vancomycin, flagyl and cefepime.  -STAT contrast CT of chest to r/o aortic dissection  -admit to ICU  -hold heparin infusion until CT confirmed with Radiology  -repeat lactate, BMP, troponin, CBC, INR, T&S now  -may need advanced access, aline, vasopressors -assess STAT ECHO -tele monitoring  -consult Cardiology  -levophed for MAP  >65 -hold home zetia, irbesartan, crestor for now  AKI  Acute Metabolic Acidosis / Lactic Acidosis  Baseline normal renal function  -Trend BMP / urinary output -Replace electrolytes as indicated -Avoid nephrotoxic agents, ensure adequate renal perfusion  Hyperglycemia  Suspect stress response  -add SSI Q4, sensitive scale  -assess Hgb A1c  Elevated AST Suspect in setting of poor perfusion  -follow LFT's  -avoid hepatotoxic agents   Hypothyroidism  -assess TSH  -continue synthroid when patient able to take PO's  Anxiety / Depression  -hold home lexapro   Best Practice (right click and "Reselect all SmartList Selections" daily)  Diet/type: NPO DVT prophylaxis: systemic heparin GI prophylaxis: N/A Lines: N/A Foley:  N/A Code Status:  limited Last date of multidisciplinary goals of care discussion: 1/23 confirmed with patient and son > patient is open to short term support with mechanical ventilation.  She would not want CPR in the event of arrest.   Labs   CBC: Recent Labs  Lab 2021-05-25 0955  WBC 18.7*  HGB 18.2*  HCT 55.0*  MCV 93.5  PLT 761    Basic Metabolic Panel: Recent Labs  Lab 05/25/2021 0955  NA 126*  K 6.2*  CL 92*  CO2 20*  GLUCOSE 374*  BUN 15  CREATININE 1.23*  CALCIUM 8.5*  MG 1.9   GFR: Estimated Creatinine Clearance: 26.6 mL/min (A) (by C-G formula based on SCr of 1.23 mg/dL (H)). Recent Labs  Lab 2021-05-25 0955 May 25, 2021 1040 05/25/21 1242  WBC 18.7*  --   --   LATICACIDVEN  --  6.3* 6.9*    Liver Function Tests: Recent Labs  Lab 05-25-2021 0955  AST 56*  ALT <5  ALKPHOS 70  BILITOT 2.2*  PROT 5.8*  ALBUMIN 2.8*   No results for input(s): LIPASE, AMYLASE in the last 168 hours. No results for input(s): AMMONIA in the last 168 hours.  ABG    Component Value Date/Time   TCO2 22 09/10/2015 0906     Coagulation Profile: Recent Labs  Lab May 25, 2021 0955  INR 1.3*    Cardiac Enzymes: Recent Labs  Lab 2021/05/25 1242   CKTOTAL 324*    HbA1C: Hgb A1c MFr Bld  Date/Time Value Ref Range Status  06/30/2016 06:40 PM 6.3 (H) 4.8 - 5.6 % Final    Comment:    (NOTE)         Pre-diabetes: 5.7 - 6.4         Diabetes: >6.4         Glycemic control for adults with diabetes: <7.0   02/12/2012 08:42 AM 6.4 4.6 - 6.5 % Final    Comment:    Glycemic Control Guidelines for People with Diabetes:Non Diabetic:  <6%Goal of Therapy: <7%Additional Action Suggested:  >8%     CBG: Recent Labs  Lab 2021-05-25 1005  GLUCAP 349*    Review of Systems:   Unable to complete as patient is  on BiPAP  Past Medical History:  She,  has a past medical history of Anemia, unspecified, Cataract, Chest pain, Coronary atherosclerosis of native coronary artery, Depressive disorder, not elsewhere classified, Diabetes mellitus, Disorder of bone and cartilage, unspecified, Diverticulosis of colon (without mention of hemorrhage), External hemorrhoids without mention of complication, Family history of malignant neoplasm of gastrointestinal tract, H/O: hysterectomy, History of back surgery (2013 ), dislocation of shoulder, Hyperlipidemia, Hypertension, Insomnia, unspecified, Lumbago, Personal history of colonic polyps, Squamous cell carcinoma of skin (12/20/2017), Unspecified adverse effect of other drug, medicinal and biological substance(995.29), Unspecified hypothyroidism, Unspecified vitamin D deficiency, Urinary frequency, and Urinary tract infection, site not specified.   Surgical History:   Past Surgical History:  Procedure Laterality Date   ABDOMINAL HYSTERECTOMY     APPENDECTOMY     1961   BACK SURGERY     2013    CARDIAC CATHETERIZATION     CATARACT EXTRACTION Right 02-17-2014   CHOLECYSTECTOMY     2006   COLONOSCOPY     CORONARY ATHERECTOMY N/A 05/21/2020   Procedure: CORONARY ATHERECTOMY;  Surgeon: Martinique, Peter M, MD;  Location: [facility];  Service: Cardiovascular;  Laterality: N/A;   CORONARY STENT INTERVENTION  N/A 05/21/2020   Procedure: CORONARY STENT INTERVENTION;  Surgeon: Martinique, Peter M, MD;  Location: [facility];  Service: Cardiovascular;  Laterality: N/A;   HEMORRHOID SURGERY     INTRAVASCULAR ULTRASOUND/IVUS N/A 05/21/2020   Procedure: Intravascular Ultrasound/IVUS;  Surgeon: Martinique, Peter M, MD;  Location: [facility];  Service: Cardiovascular;  Laterality: N/A;   LEFT HEART CATH AND CORONARY ANGIOGRAPHY N/A 05/20/2020   Procedure: LEFT HEART CATH AND CORONARY ANGIOGRAPHY;  Surgeon: Burnell Blanks, MD;  Location: [facility];  Service: Cardiovascular;  Laterality: N/A;   POLYPECTOMY     [place], 2001     Social History:   reports that she has never smoked. She has never used smokeless tobacco. She reports current alcohol use of about 7.0 standard drinks per week. She reports that she does not use drugs.   Family History:  Her family history includes Colon cancer in some other family members; Diabetes in her brother, mother, and sister; Heart disease in her brother and mother; Kidney disease in her mother; Rectal cancer in an other family member; Stomach cancer in her maternal grandmother. There is no history of Esophageal cancer.   Allergies Allergies  Allergen Reactions   Atorvastatin Other (See Comments)    Muscle pain   Codeine     itching     Home Medications  Prior to Admission medications   Medication Sig Start Date End Date Taking? Authorizing Provider  aspirin 81 MG EC tablet TAKE 1 TABLET (81 MG TOTAL) BY MOUTH DAILY. SWALLOW WHOLE. Patient taking differently: Take 81 mg by mouth daily. SWALLOW WHOLE. 05/22/20 05/22/21 Yes Burnell Blanks, MD  Chlorphen-Phenyleph-ASA (ALKA-SELTZER PLUS COLD) 2-7.8-325 MG TBEF Take 2 tablets by mouth every 6 (six) hours as needed (cold symptoms).   Yes [provider]  Cholecalciferol (VITAMIN D-3) 125 MCG (5000 UT) TABS Take 5,000 Units by mouth daily.   Yes [provider]  clopidogrel (PLAVIX) 75 MG tablet Take 1 tablet (75 mg total) by mouth daily with breakfast. 05/22/20  Yes Burnell Blanks, MD  Cyanocobalamin (VITAMIN B 12 PO) Take 5,000 Units by mouth daily.   Yes [provider]  docusate sodium (COLACE) 100 MG capsule Take  100 mg by mouth daily as needed for mild constipation.   Yes [provider]  escitalopram (LEXAPRO) 10 MG tablet Take 10 mg by mouth daily.   Yes [provider]  ezetimibe (ZETIA) 10 MG tablet Take 10 mg by mouth daily. 10/19/15  Yes [provider]  fish oil-omega-3 fatty acids 1000 MG capsule Take 1 g by mouth daily as needed (if eat something with fat).   Yes [provider]  irbesartan (AVAPRO) 300 MG tablet Take 300 mg by mouth at bedtime. 11/04/15  Yes [provider]  levothyroxine (SYNTHROID, LEVOTHROID) 50 MCG tablet TAKE 1 TABLET BY MOUTH EVERY DAY Patient taking differently: Take 50 mcg by mouth daily.   Yes Panosh, Standley Brooking, MD  LORazepam (ATIVAN) 2 MG tablet Take 2 mg by mouth at bedtime as needed for sleep.   Yes [provider]  Multiple Vitamins-Minerals (MULTIVITAMIN WITH MINERALS) tablet Take 1 tablet by mouth daily. Centrum Silver   Yes [provider]  rosuvastatin (CRESTOR) 10 MG tablet Take 1 tablet (10 mg total) by mouth daily. 09/23/20  Yes Chandrasekhar, Mahesh A, MD  gabapentin (NEURONTIN) 600 MG tablet Take 600 mg by mouth 3 (three) times daily. Patient not taking: Reported on 2021-05-28 05/26/20   [provider]  nitroGLYCERIN (NITROSTAT) 0.4 MG SL tablet Place 1 tablet (0.4 mg total) under the tongue every 5 (five) minutes as needed for chest pain. 03/28/12   Wall, Marijo Conception, MD     Critical care time: 58 minutes     Noe Gens, MSN, APRN, NP-C, AGACNP-BC [facility] May 28, 2021, 2:35 PM   Please see [email or web address] for pager details.   From 7A-7P if no response, please call [phone] After hours,  please call ELink [phone]

## 2021-05-14 NOTE — Progress Notes (Signed)
°  Echocardiogram 2D Echocardiogram has been performed.  Darlina Sicilian M 2021/06/02, 3:50 PM

## 2021-05-14 NOTE — Consult Note (Addendum)
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 swirling of contrast material and blood, no convincing dissection. Consider repeat CT with delayed contrast bolus for more definitive evaluation. 3. Moderate hiatal hernia without obstruction. 4. Small pericardial and trace pleural effusions. 5. Incidental heterogeneous and enlarged thyroid. Consider thyroid ultrasound for further characterization; in the setting of significant comorbidities or limited life expectancy, no follow-up recommended (ref: J Am Coll Radiol. 2015 Feb;12(2): 143-50). Electronically Signed   By: Lucrezia Europe M.D.   On: 05/25/21 16:09     Medications:     Current Medications:  EPINEPHrine       EPINEPHrine NaCl       heparin  3,300 Units Intravenous Once   insulin aspart  0-9 Units Subcutaneous Q4H   sodium bicarbonate       sodium bicarbonate        Infusions:  sodium chloride Stopped (05-25-2021 1400)   albumin human     [START ON 05/06/2021] ceFEPime (MAXIPIME) IV      epinephrine     heparin     norepinephrine     norepinephrine (LEVOPHED) Adult infusion      sodium bicarbonate (isotonic) infusion in sterile water     [START ON 05/07/2021] vancomycin     vasopressin        Assessment/Plan    Shock: -Etiology uncertain. No PE or dissection on CTA chest -? Cardiogenic. 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 Clayborne Dana read -Presenting with profound lactic acidosis and multisystem organ failure. Difficulty maintaining MAPs on 3 pressors.  -She is extremely uncomfortable with increased work of breathing on BiPAP.  -Son present at bedside. Chances of survival are extremely low. Patient wants to be comfortable. Will transition to comfort measures.  2. Acute metabolic acidosis: -As above -Lactic acid > 6  3. CAD: -Hx PCI to RCA, chronically occluded LCx   Length of Stay: 0  FINCH, LINDSAY N, PA-C  2021/05/25, 4:56 PM  Advanced Heart Failure Team Pager [phone] (M-F; 7a - 5p)  Please contact [facility] for night-coverage after hours (4p -7a ) and weekends on [email or web address]   Agree with above   4 y/o women with CAD s/p previous RCA stent. She has remained very active without any recent anginal symptoms. Presented to ED today with recurrent syncope and collapse at home. In ER she appeared to be in shock with cool extremities. HS trop 34, lactic acid 6.3>6.9. Scr 1.23, K 6.2, Na 126. HCO3 20. Given 2.5L fluid IV for suspected sepsis. Empiric abx started. Developed worsening dyspnea and required BiPAP. ECG sinus 80, inferior Qs, poor r wave progression. CT C/A/P with no PE. Swirling of contrast in thoracic aorta suggestive of low flow. . ECG with suspected new anterior q waves.   Moved to ICU  Echo with small LV EF read  as 60-65% with small to moderate pericardial effusion. No tamponade (on my read LV is small but EF more like 30-35%)   On my arrival. She is uncomfortable on bipap. EPI 20 NE 40 + VP.   General:   Elderly. Weak appearing. On bipap HEENT: normal + bipap Neck: supple. Jvp to jaw  Carotids 2+ bilat; no bruits. No lymphadenopathy or thryomegaly appreciated. Cor: PMI nondisplaced. Regular  + s3 Lungs: + crackles Abdomen: soft, nontender, nondistended. No hepatosplenomegaly. No bruits or masses. Good bowel sounds. Extremities: no cyanosis, clubbing, rash, edema ice cold Neuro: alert uncomfortable cranial nerves grossly intact. moves all 4 extremities w/o difficulty. Affect pleasant  She is actively dying from cardiogenic shock of unclear etiology. EF is down and ECG suggestive of new anterior q waves but hs troponin is essentially normal. There is no evidence of tamponade on echo.   I discussed case with her, her son and CCM team. She unfortunately has no chance of survival She is not a candidate for mechanical support. We discussed ongoing aggressive care versus comfort care and she was clear that she was very uncomfortable and wanted comfort care immediately.  Morphine gtt started and she passed soon after.   CRITICAL CARE Performed by: Glori Bickers  Total critical care time: 50 minutes  Critical care time was exclusive of separately billable procedures and treating other patients.  Critical care was necessary to treat or prevent imminent or life-threatening deterioration.  Critical care was time spent personally by me (independent of midlevel providers or residents) on the following activities: development of treatment plan with patient and/or surrogate as well as nursing, discussions with consultants, evaluation of patient's response to treatment, examination of patient, obtaining history from patient or surrogate, ordering and performing treatments and interventions, ordering and review of laboratory studies, ordering and review of radiographic studies, pulse oximetry and re-evaluation of patient's condition.  Glori Bickers, MD  11:49 PM

## 2021-05-14 DEATH — deceased
